# Patient Record
Sex: Female | Born: 1975 | Race: Asian | Hispanic: No | Marital: Married | Smoking: Never smoker
Health system: Southern US, Community
[De-identification: ages and names within clinical notes are randomized; demographics above are authoritative.]

## PROBLEM LIST (undated history)

## (undated) DIAGNOSIS — C801 Malignant (primary) neoplasm, unspecified: Secondary | ICD-10-CM

## (undated) DIAGNOSIS — Z789 Other specified health status: Secondary | ICD-10-CM

## (undated) HISTORY — PX: NO PAST SURGERIES: SHX2092

---

## 2012-09-06 ENCOUNTER — Other Ambulatory Visit (HOSPITAL_COMMUNITY): Payer: Self-pay | Admitting: Physician Assistant

## 2012-09-06 DIAGNOSIS — Z3689 Encounter for other specified antenatal screening: Secondary | ICD-10-CM

## 2012-09-06 LAB — OB RESULTS CONSOLE ANTIBODY SCREEN: Antibody Screen: NEGATIVE

## 2012-09-06 LAB — OB RESULTS CONSOLE RPR: RPR: NONREACTIVE

## 2012-09-06 LAB — OB RESULTS CONSOLE GC/CHLAMYDIA: Chlamydia: NEGATIVE

## 2012-09-11 ENCOUNTER — Ambulatory Visit (HOSPITAL_COMMUNITY)
Admission: RE | Admit: 2012-09-11 | Discharge: 2012-09-11 | Disposition: A | Discharge status: Home / Self Care | Payer: Medicaid Other | Source: Ambulatory Visit | Attending: Physician Assistant | Admitting: Physician Assistant

## 2012-09-11 DIAGNOSIS — Z3689 Encounter for other specified antenatal screening: Secondary | ICD-10-CM

## 2012-09-11 DIAGNOSIS — Z1389 Encounter for screening for other disorder: Secondary | ICD-10-CM | POA: Insufficient documentation

## 2012-09-11 DIAGNOSIS — Z363 Encounter for antenatal screening for malformations: Secondary | ICD-10-CM | POA: Insufficient documentation

## 2012-09-11 DIAGNOSIS — O358XX0 Maternal care for other (suspected) fetal abnormality and damage, not applicable or unspecified: Secondary | ICD-10-CM | POA: Insufficient documentation

## 2012-11-08 NOTE — L&D Delivery Note (Signed)
I was present for entire delivery and repair. Patient presented in spontaneous labor and progressed normally to complete dilation. Napoleon Form, MD

## 2012-11-08 NOTE — L&D Delivery Note (Signed)
Delivery Note At 2:19 AM a viable female was delivered via Vaginal, Spontaneous Delivery (Presentation: Left Occiput Anterior).  APGAR: 9, 10; weight Pending.   Placenta status: Intact, Spontaneous delivery, partial retained products that were manually removed.  Cord: 3 vessels with the following complications: None.  Cord pH: N/A  Anesthesia: Epidural  Episiotomy: None Lacerations: Labial, 1st Degree Suture Repair: 3.0 vicryl rapide Est. Blood Loss (mL): 500 cc  Mom to postpartum.  Baby to nursery-stable.  Twana First Reema Chick, DO of [facility] 12/12/2012, 3:06 AM

## 2012-11-13 ENCOUNTER — Other Ambulatory Visit (HOSPITAL_COMMUNITY): Payer: Self-pay | Admitting: Nurse Practitioner

## 2012-11-14 ENCOUNTER — Ambulatory Visit (HOSPITAL_COMMUNITY)
Admission: RE | Admit: 2012-11-14 | Discharge: 2012-11-14 | Disposition: A | Discharge status: Home / Self Care | Payer: Medicaid Other | Source: Ambulatory Visit | Attending: Nurse Practitioner | Admitting: Nurse Practitioner

## 2012-11-14 DIAGNOSIS — O365990 Maternal care for other known or suspected poor fetal growth, unspecified trimester, not applicable or unspecified: Secondary | ICD-10-CM | POA: Insufficient documentation

## 2012-11-14 DIAGNOSIS — Z3689 Encounter for other specified antenatal screening: Secondary | ICD-10-CM | POA: Insufficient documentation

## 2012-11-29 LAB — OB RESULTS CONSOLE GBS: GBS: POSITIVE

## 2012-12-06 ENCOUNTER — Other Ambulatory Visit (HOSPITAL_COMMUNITY): Payer: Self-pay | Admitting: Nurse Practitioner

## 2012-12-06 DIAGNOSIS — O365990 Maternal care for other known or suspected poor fetal growth, unspecified trimester, not applicable or unspecified: Secondary | ICD-10-CM

## 2012-12-11 ENCOUNTER — Encounter (HOSPITAL_COMMUNITY): Payer: Self-pay | Admitting: *Deleted

## 2012-12-11 ENCOUNTER — Encounter (HOSPITAL_COMMUNITY): Payer: Self-pay

## 2012-12-11 ENCOUNTER — Inpatient Hospital Stay (HOSPITAL_COMMUNITY)
Admission: AD | Admit: 2012-12-11 | Discharge: 2012-12-14 | DRG: 774 | Disposition: A | Discharge status: Home / Self Care | Payer: Medicaid Other | Source: Ambulatory Visit | Attending: Obstetrics & Gynecology | Admitting: Obstetrics & Gynecology

## 2012-12-11 ENCOUNTER — Encounter (HOSPITAL_COMMUNITY): Payer: Self-pay | Admitting: Anesthesiology

## 2012-12-11 ENCOUNTER — Inpatient Hospital Stay (HOSPITAL_COMMUNITY): Payer: Medicaid Other | Admitting: Anesthesiology

## 2012-12-11 ENCOUNTER — Ambulatory Visit (HOSPITAL_COMMUNITY)
Admission: RE | Admit: 2012-12-11 | Discharge: 2012-12-11 | Disposition: A | Discharge status: Home / Self Care | Payer: Medicaid Other | Source: Ambulatory Visit | Attending: Nurse Practitioner | Admitting: Nurse Practitioner

## 2012-12-11 ENCOUNTER — Inpatient Hospital Stay (HOSPITAL_COMMUNITY)
Admission: AD | Admit: 2012-12-11 | Discharge: 2012-12-11 | DRG: 780 | Disposition: A | Discharge status: Home / Self Care | Payer: Medicaid Other | Source: Ambulatory Visit | Attending: Obstetrics & Gynecology | Admitting: Obstetrics & Gynecology

## 2012-12-11 DIAGNOSIS — O365990 Maternal care for other known or suspected poor fetal growth, unspecified trimester, not applicable or unspecified: Secondary | ICD-10-CM | POA: Insufficient documentation

## 2012-12-11 DIAGNOSIS — O479 False labor, unspecified: Principal | ICD-10-CM | POA: Diagnosis present

## 2012-12-11 DIAGNOSIS — Z22330 Carrier of Group B streptococcus: Secondary | ICD-10-CM

## 2012-12-11 DIAGNOSIS — O09529 Supervision of elderly multigravida, unspecified trimester: Secondary | ICD-10-CM | POA: Diagnosis present

## 2012-12-11 DIAGNOSIS — O99892 Other specified diseases and conditions complicating childbirth: Secondary | ICD-10-CM | POA: Diagnosis present

## 2012-12-11 DIAGNOSIS — Z3689 Encounter for other specified antenatal screening: Secondary | ICD-10-CM

## 2012-12-11 DIAGNOSIS — O99891 Other specified diseases and conditions complicating pregnancy: Secondary | ICD-10-CM | POA: Diagnosis present

## 2012-12-11 DIAGNOSIS — IMO0001 Reserved for inherently not codable concepts without codable children: Secondary | ICD-10-CM

## 2012-12-11 DIAGNOSIS — O471 False labor at or after 37 completed weeks of gestation: Secondary | ICD-10-CM

## 2012-12-11 HISTORY — DX: Other specified health status: Z78.9

## 2012-12-11 LAB — RPR: RPR Ser Ql: NONREACTIVE

## 2012-12-11 LAB — CBC
Hemoglobin: 11.6 g/dL — ABNORMAL LOW (ref 12.0–15.0)
Platelets: 231 10*3/uL (ref 150–400)
RBC: 3.94 MIL/uL (ref 3.87–5.11)
WBC: 10.6 10*3/uL — ABNORMAL HIGH (ref 4.0–10.5)

## 2012-12-11 MED ORDER — OXYTOCIN BOLUS FROM INFUSION: 500.0000 mL | INTRAVENOUS | Status: DC

## 2012-12-11 MED ORDER — EPHEDRINE 5 MG/ML INJ: 10.0000 mg | INTRAVENOUS | Status: DC | PRN

## 2012-12-11 MED ORDER — PENICILLIN G POTASSIUM 5000000 UNITS IJ SOLR
5.0000 10*6.[IU] | Freq: Once | INTRAVENOUS | Status: AC
  Administered 2012-12-11: 5 10*6.[IU] via INTRAVENOUS
  Filled 2012-12-11: qty 5

## 2012-12-11 MED ORDER — OXYCODONE-ACETAMINOPHEN 5-325 MG PO TABS: 1.0000 | ORAL_TABLET | ORAL | Status: DC | PRN

## 2012-12-11 MED ORDER — NALBUPHINE HCL 10 MG/ML IJ SOLN: 10.0000 mg | INTRAMUSCULAR | Status: DC | PRN

## 2012-12-11 MED ORDER — LACTATED RINGERS IV SOLN: 500.0000 mL | INTRAVENOUS | Status: DC | PRN

## 2012-12-11 MED ORDER — IBUPROFEN 600 MG PO TABS: 600.0000 mg | ORAL_TABLET | Freq: Four times a day (QID) | ORAL | Status: DC | PRN

## 2012-12-11 MED ORDER — NALBUPHINE SYRINGE 5 MG/0.5 ML
10.0000 mg | INJECTION | INTRAMUSCULAR | Status: DC | PRN
  Administered 2012-12-11: 10 mg via INTRAVENOUS
  Filled 2012-12-11: qty 1

## 2012-12-11 MED ORDER — CITRIC ACID-SODIUM CITRATE 334-500 MG/5ML PO SOLN: 30.0000 mL | ORAL | Status: DC | PRN

## 2012-12-11 MED ORDER — EPHEDRINE 5 MG/ML INJ
10.0000 mg | INTRAVENOUS | Status: DC | PRN
  Filled 2012-12-11: qty 4

## 2012-12-11 MED ORDER — PENICILLIN G POTASSIUM 5000000 UNITS IJ SOLR
2.5000 10*6.[IU] | INTRAMUSCULAR | Status: DC
  Filled 2012-12-11 (×4): qty 2.5

## 2012-12-11 MED ORDER — FLEET ENEMA 7-19 GM/118ML RE ENEM: 1.0000 | ENEMA | RECTAL | Status: DC | PRN

## 2012-12-11 MED ORDER — ACETAMINOPHEN 325 MG PO TABS: 650.0000 mg | ORAL_TABLET | ORAL | Status: DC | PRN

## 2012-12-11 MED ORDER — OXYTOCIN 40 UNITS IN LACTATED RINGERS INFUSION - SIMPLE MED: 62.5000 mL/h | INTRAVENOUS | Status: DC

## 2012-12-11 MED ORDER — PHENYLEPHRINE 40 MCG/ML (10ML) SYRINGE FOR IV PUSH (FOR BLOOD PRESSURE SUPPORT): 80.0000 ug | PREFILLED_SYRINGE | INTRAVENOUS | Status: DC | PRN

## 2012-12-11 MED ORDER — OXYTOCIN 40 UNITS IN LACTATED RINGERS INFUSION - SIMPLE MED
62.5000 mL/h | INTRAVENOUS | Status: DC
  Administered 2012-12-12: 62.5 mL/h via INTRAVENOUS
  Administered 2012-12-12: 999 mL/h via INTRAVENOUS
  Filled 2012-12-11: qty 1000

## 2012-12-11 MED ORDER — LACTATED RINGERS IV SOLN
500.0000 mL | Freq: Once | INTRAVENOUS | Status: AC
  Administered 2012-12-11: 500 mL via INTRAVENOUS

## 2012-12-11 MED ORDER — PHENYLEPHRINE 40 MCG/ML (10ML) SYRINGE FOR IV PUSH (FOR BLOOD PRESSURE SUPPORT)
80.0000 ug | PREFILLED_SYRINGE | INTRAVENOUS | Status: DC | PRN
  Filled 2012-12-11: qty 5

## 2012-12-11 MED ORDER — LACTATED RINGERS IV SOLN
INTRAVENOUS | Status: DC
  Administered 2012-12-11: 14:00:00 via INTRAVENOUS

## 2012-12-11 MED ORDER — LIDOCAINE HCL (PF) 1 % IJ SOLN
INTRAMUSCULAR | Status: DC | PRN
  Administered 2012-12-11 (×2): 5 mL

## 2012-12-11 MED ORDER — LIDOCAINE HCL (PF) 1 % IJ SOLN: 30.0000 mL | INTRAMUSCULAR | Status: DC | PRN

## 2012-12-11 MED ORDER — FLEET ENEMA 7-19 GM/118ML RE ENEM: 1.0000 | ENEMA | Freq: Every day | RECTAL | Status: DC | PRN

## 2012-12-11 MED ORDER — SODIUM CHLORIDE 0.9 % IV SOLN
2.0000 g | Freq: Once | INTRAVENOUS | Status: AC
  Administered 2012-12-11: 2 g via INTRAVENOUS
  Filled 2012-12-11: qty 2000

## 2012-12-11 MED ORDER — DIPHENHYDRAMINE HCL 50 MG/ML IJ SOLN: 12.5000 mg | INTRAMUSCULAR | Status: DC | PRN

## 2012-12-11 MED ORDER — FENTANYL 2.5 MCG/ML BUPIVACAINE 1/10 % EPIDURAL INFUSION (WH - ANES)
14.0000 mL/h | INTRAMUSCULAR | Status: DC
  Administered 2012-12-11: 14 mL/h via EPIDURAL
  Filled 2012-12-11: qty 125

## 2012-12-11 MED ORDER — ONDANSETRON HCL 4 MG/2ML IJ SOLN: 4.0000 mg | Freq: Four times a day (QID) | INTRAMUSCULAR | Status: DC | PRN

## 2012-12-11 MED ORDER — LACTATED RINGERS IV SOLN
INTRAVENOUS | Status: DC
  Administered 2012-12-11: 23:00:00 via INTRAVENOUS

## 2012-12-11 MED ORDER — ZOLPIDEM TARTRATE 5 MG PO TABS: 5.0000 mg | ORAL_TABLET | Freq: Every evening | ORAL | Status: DC | PRN

## 2012-12-11 NOTE — Discharge Summary (Signed)
  Claire Bray  ADMIT DATE:  12/11/2012  DISCHARGE DATE:  12/11/2012   ATTENDING:  Jaynie Collins, MD  DISCHARGE DIAGNOSES:  IUP at [redacted]w[redacted]d, false labor  Results for orders placed during the hospital encounter of 12/11/12 (from the past 24 hour(s))  CBC     Status: Abnormal   Collection Time   12/11/12  1:35 PM      Component Value Range   WBC 10.6 (*) 4.0 - 10.5 K/uL   RBC 3.94  3.87 - 5.11 MIL/uL   Hemoglobin 11.6 (*) 12.0 - 15.0 g/dL   HCT 09.8 (*) 11.9 - 14.7 %   MCV 89.3  78.0 - 100.0 fL   MCH 29.4  26.0 - 34.0 pg   MCHC 33.0  30.0 - 36.0 g/dL   RDW 82.9  56.2 - 13.0 %   Platelets 231  150 - 400 K/uL   Studies: ULTRASOUND/BPP AFI 10.1, BPP 6/8 - minus 2 for fetal breathing. EFW 21%, cephalic  FHTs:  865-784, moderate variability, accels present, no decels - monitored for 4.5 hours TOCO;  Contractions every 8-10 minutes with irritabilty  HOSP COURSE:  Claire Bray is a 37 y.o. G2P1001 at [redacted]w[redacted]d sent from the health department for a biophysical profile. She was also complaining of abdominal pain that is intermittent, mostly both sides and lower abdomen. Her cervix changed from 3 to 4 cm in MAU so she was admitted to Labor and Delivery for active labor. However, her cervix did not change further after 5 hours, and her contractions spaced out to every 8 to 10 minutes. She continued to have intermittent abdominal pain and was given a dose of Nubain, which helped.   At time of discharge her exam was as follows: Filed Vitals:   12/11/12 1329  BP: 123/82  Pulse: 84  Temp:   Resp: 18   GEN:  WNWD, no acute distress ABD:  Non-tender, no guarding or rebound EXTREM:  No edema or tenderness  Dilation: 4 Effacement (%): 50 Cervical Position: Middle Station: -2 Presentation: Vertex Exam by:: Dr. Thad Ranger  She was discharged with labor precautions discussed via telephone [place] translator with tylenol for pain and ambien for sleep tonight.   ACTIVITY:  As tolerated DIET:  Regular FOLLOW  UP at [facility] Department tomorrow as scheduled  Napoleon Form, MD 12/11/2012 5:45 PM

## 2012-12-11 NOTE — MAU Note (Signed)
Pt states via [place] interpreter notes vaginal discharge this am, denies gush of fluid or bleeding. States feeling cramping since early this am.

## 2012-12-11 NOTE — MAU Note (Signed)
Patient speaks [place] Patients husband states she is having a little contractions and had a little white discharge this am. Reports good fetal movement. Will call translator for triage.

## 2012-12-11 NOTE — H&P (Signed)
Claire Bray is a 37 y.o. female presenting for abdominal pain that started this morning. History is done through phone translator in [place] Pain comes and goes but has been present since morning. Patient indicates pain is bilateral mid- to low-abdomen. She states that it is 7/10. No bleeding or loss of fluid. Baby active and moving.  [facility] She was sent to hospital for BPP today, which was 6/8 (minus 2 for fetal breathing). . Maternal Medical History:  Reason for admission: Reason for admission: contractions.  Reason for Admission:   nauseaContractions: Onset was 6-12 hours ago.   Frequency: irregular.   Perceived severity is moderate.    Fetal activity: Perceived fetal activity is normal.    Prenatal complications: no prenatal complications Prenatal Complications - Diabetes: none.    OB History    Grav Para Term Preterm Abortions TAB SAB Ect Mult Living   2 1 1       1      Past Medical History  Diagnosis Date  . No pertinent past medical history    Past Surgical History  Procedure Date  . No past surgeries    Family History: family history is negative for Other. Social History:  reports that she has never smoked. She has never used smokeless tobacco. She reports that she does not drink alcohol or use illicit drugs.   Prenatal Transfer Tool  Maternal Diabetes: No Genetic Screening: Declined Maternal Ultrasounds/Referrals: Normal Fetal Ultrasounds or other Referrals:  None Maternal Substance Abuse:  No Significant Maternal Medications:  None Significant Maternal Lab Results:  Lab values include: Group B Strep positive Other Comments:  None  Review of Systems  Constitutional: Negative for fever and chills.  Gastrointestinal: Positive for abdominal pain. Negative for nausea, vomiting, diarrhea and constipation.  Genitourinary: Negative for dysuria.  Musculoskeletal: Positive for back pain.    Dilation: 4 Effacement (%):  50 Station: -2 Exam by:: Dr. Thad Ranger Blood pressure 103/67, pulse 76, temperature 98 F (36.7 C), temperature source Oral, resp. rate 18, height 5' 4.5" (1.638 m), weight 65.318 kg (144 lb). Maternal Exam:  Uterine Assessment: Contraction strength is moderate.  Contraction frequency is irregular.   Abdomen: Fetal presentation: vertex  Introitus: Normal vulva. Normal vagina.  Vagina is negative for discharge.  Ferning test: not done.  Nitrazine test: not done.  Pelvis: adequate for delivery.   Cervix: Cervix evaluated by digital exam.     Fetal Exam Fetal Monitor Review: Baseline rate: 135.  Variability: moderate (6-25 bpm).   Pattern: accelerations present and no decelerations.    Fetal State Assessment: Category I - tracings are normal.     Physical Exam  Constitutional: She is oriented to person, place, and time. She appears well-developed and well-nourished. No distress.  HENT:  Head: Normocephalic and atraumatic.  Eyes: Conjunctivae normal and EOM are normal.  Neck: Normal range of motion. Neck supple.  Cardiovascular: Normal rate.   Respiratory: Effort normal. No respiratory distress.  GI: Soft. There is no tenderness. There is no rebound and no guarding.  Genitourinary: Vagina normal. No vaginal discharge found.  Musculoskeletal: Normal range of motion. She exhibits no edema and no tenderness.  Neurological: She is alert and oriented to person, place, and time.  Skin: Skin is warm and dry.  Psychiatric: She has a normal mood and affect.    Filed Vitals:   12/11/12 1328 12/11/12 1329 12/11/12 1748 12/11/12 1749  BP:  123/82 103/67 103/67  Pulse:  84  76  Temp: 97.9 F (36.6 C)  98 F (36.7 C)   TempSrc: Oral  Oral   Resp: 18 18 18 18   Height:      Weight:         Cervix:  3/40/-3 --> 4/50/-2 in MAU   Prenatal labs: ABO, Rh: AB/Positive/-- (10/30 0000) Antibody: Negative (10/30 0000) Rubella: Immune (10/30 0000) RPR: Nonreactive (10/30 0000)  HBsAg:  Negative (10/30 0000)  HIV: Non-reactive (10/30 0000)  GBS: Positive (01/22 0000)   Assessment/Plan: 37 y.o. G2P1001 at [redacted]w[redacted]d with contractions and cervical change - Admit to L&D - GBS prophylaxis with penicillin - NST reassuring - Expectant management, anticipate SVD  Napoleon Form 12/11/2012, 10:06 PM

## 2012-12-11 NOTE — Anesthesia Procedure Notes (Signed)
Epidural Patient location during procedure: OB Start time: 12/11/2012 11:50 PM  Staffing Anesthesiologist: Angus Seller., Harrell Gave. Performed by: anesthesiologist   Preanesthetic Checklist Completed: patient identified, site marked, surgical consent, pre-op evaluation, timeout performed, IV checked, risks and benefits discussed and monitors and equipment checked  Epidural Patient position: sitting Prep: site prepped and draped and DuraPrep Patient monitoring: continuous pulse ox and blood pressure Approach: midline Injection technique: LOR air  Needle:  Needle type: Tuohy  Needle gauge: 17 G Needle length: 9 cm and 9 Needle insertion depth: 5 cm cm Catheter type: closed end flexible Catheter size: 19 Gauge Catheter at skin depth: 10 cm Test dose: negative  Assessment Events: blood not aspirated, injection not painful, no injection resistance, negative IV test and no paresthesia  Additional Notes Patient identified.  Risk benefits discussed including failed block, incomplete pain control, headache, nerve damage, paralysis, blood pressure changes, nausea, vomiting, reactions to medication both toxic or allergic, and postpartum back pain.  Patient expressed understanding and wished to proceed.  All questions were answered.  Sterile technique used throughout procedure and epidural site dressed with sterile barrier dressing. No paresthesia or other complications noted.The patient did not experience any signs of intravascular injection such as tinnitus or metallic taste in mouth nor signs of intrathecal spread such as rapid motor block. Please see nursing notes for vital signs.

## 2012-12-11 NOTE — OB Triage Note (Signed)
Discharge instructions given and discussed with patient and significant other. [place] interpreters used. All questions answered. Patient discharged home with significant other.

## 2012-12-11 NOTE — H&P (Signed)
Attestation of Attending Supervision of Advanced Practitioner (PA/CNM/NP): Evaluation and management procedures were performed by the Advanced Practitioner under my supervision and collaboration.  I have reviewed the Advanced Practitioner's note and chart, and I agree with the management and plan.  Akiya Morr, MD, FACOG Attending Obstetrician & Gynecologist Faculty Practice, [facility]  

## 2012-12-11 NOTE — Anesthesia Preprocedure Evaluation (Signed)

## 2012-12-11 NOTE — H&P (Signed)
Claire Bray is a 37 y.o. female presenting for contractions. Pt was admitted earlier today at 4 cm but had no change for several hours and was discharged home. She returns with stronger contractions and now dilated to 6.5 cm.   Maternal Medical History:  Reason for admission: Reason for admission: contractions.  Reason for Admission:   nauseaContractions: Onset was 6-12 hours ago.   Frequency: regular.   Perceived severity is strong.    Fetal activity: Perceived fetal activity is normal.   Last perceived fetal movement was within the past hour.    Prenatal complications: no prenatal complications Prenatal Complications - Diabetes: none.    OB History    Grav Para Term Preterm Abortions TAB SAB Ect Mult Living   2 1 1       1      Past Medical History  Diagnosis Date  . No pertinent past medical history    Past Surgical History  Procedure Date  . No past surgeries    Family History: family history is negative for Other. Social History:  reports that she has never smoked. She has never used smokeless tobacco. She reports that she does not drink alcohol or use illicit drugs.   Prenatal Transfer Tool  Maternal Diabetes: No Genetic Screening: Declined Maternal Ultrasounds/Referrals: Normal Fetal Ultrasounds or other Referrals:  None Maternal Substance Abuse:  No Significant Maternal Medications:  None Significant Maternal Lab Results:  Lab values include: Group B Strep positive Other Comments:  None  Review of Systems  Constitutional: Negative for fever, chills and diaphoresis.  Eyes: Negative for blurred vision and double vision.  Respiratory: Negative for shortness of breath.   Cardiovascular: Negative for chest pain.  Gastrointestinal: Positive for abdominal pain. Negative for nausea, vomiting, diarrhea and constipation.  Genitourinary: Negative for dysuria.  Musculoskeletal: Negative for back pain.  Neurological: Negative for dizziness and headaches.    Dilation:  6.5 Effacement (%): 90 Station: -2 Exam by:: Vira Blanco, RN There were no vitals taken for this visit. Maternal Exam:  Uterine Assessment: Contraction strength is firm.  Contraction frequency is regular.   Abdomen: Patient reports generalized tenderness.  Fetal presentation: vertex  Introitus: Normal vulva. Normal vagina.  Pelvis: adequate for delivery.   Cervix: Cervix evaluated by digital exam.     Fetal Exam Fetal Monitor Review: Mode: ultrasound.   Baseline rate: 135-140.  Variability: moderate (6-25 bpm).   Pattern: accelerations present and no decelerations.    Fetal State Assessment: Category I - tracings are normal.     Physical Exam  Constitutional: She is oriented to person, place, and time. She appears well-developed and well-nourished. She appears distressed (from pain).  HENT:  Head: Normocephalic and atraumatic.  Eyes: Conjunctivae normal and EOM are normal.  Neck: Normal range of motion.  Cardiovascular: Normal rate and regular rhythm.   Respiratory: Effort normal. No respiratory distress.  GI: There is generalized tenderness. There is no rebound and no guarding.  Genitourinary:       Normal external genitalia, normal vagina. Bloody show.  Musculoskeletal: Normal range of motion. She exhibits no edema and no tenderness.  Neurological: She is alert and oriented to person, place, and time.  Skin: Skin is warm and dry.  Psychiatric: She has a normal mood and affect.    Cervix 6.5/90/-2  Prenatal labs: ABO, Rh: AB/Positive/-- (10/30 0000) Antibody: Negative (10/30 0000) Rubella: Immune (10/30 0000) RPR: Nonreactive (10/30 0000)  HBsAg: Negative (10/30 0000)  HIV: Non-reactive (10/30 0000)  GBS: Positive (  01/22 0000)   Assessment/Plan: 37 y.o. G2P1001 at [redacted]w[redacted]d with SOL, 6.5 cm dilated Admit to L&D GBS prophylaxis with ampicillin Epidural as soon as possible Anticipate SVD   Napoleon Form 12/11/2012, 11:03 PM

## 2012-12-11 NOTE — Discharge Summary (Signed)
Attestation of Attending Supervision of Obstetric Fellow: Evaluation and management procedures were performed by the Obstetric Fellow under my supervision and collaboration.  I have reviewed the Obstetric Fellow's note and chart, and I agree with the management and plan.  UGONNA  ANYANWU, MD, FACOG Attending Obstetrician & Gynecologist Faculty Practice, [facility]   

## 2012-12-11 NOTE — ED Notes (Signed)
Claire Bray is a 37 y.o. female presenting for labor rule out.  [place] Education officer, community used via phone.  37 yo G2P1001 at 39.0 by 26 week [place]. Presented this am for [place] with BPP for fetal growth monitoring and ?non reactive NST. BPP today was 6/8 (no breathing observed).  She reports feeling abdominal tightening, constant, located in mid abdomen, which started at 7am. Feeling fetal movement. Felt some urine this morning, but no leaking of fluid. No blood.   Prenatal history has been complicated by late prenatal care, advanced maternal age and measuring small for dates. [place] on 12/11/12 showed estimated fetal weight in the 21st percentile.   History OB History    Grav Para Term Preterm Abortions TAB SAB Ect Mult Living   2 1 1       1      Past Medical History  Diagnosis Date  . No pertinent past medical history    Past Surgical History  Procedure Date  . No past surgeries    Family History: family history is negative for Other. Social History:  reports that she has never smoked. She has never used smokeless tobacco. She reports that she does not drink alcohol or use illicit drugs.   Prenatal Transfer Tool  Maternal Diabetes: No Genetic Screening: normal quad screen Maternal Ultrasounds/Referrals: Normal Fetal Ultrasounds or other Referrals:  Other: BPP on 12/11/12: 6/8 (-2 points for fetal breathing) Maternal Substance Abuse:  No Significant Maternal Medications:  Meds include: Other: amoxicillin  Significant Maternal Lab Results:  Lab values include: Group B Strep positive Other Comments:  BPP on 12/11/12: 6/8 (-2 points for fetal breathing).   ROS Negative except per HPI Dilation: 4 Effacement (%): 50 Station: -2 Exam by:: SBeck, RN Blood pressure 111/86, pulse 87, temperature 97.9 F (36.6 C), temperature source Oral, resp. rate 18, height 5' 4.5" (1.638 m), weight 144 lb (65.318 kg). Exam Physical Exam  Physical Examination: General appearance - alert, well appearing, and in  distress with contractions Mouth -  Poor dentition Chest - clear to auscultation, no wheezes, rales or rhonchi, symmetric air entry Heart - normal rate, regular rhythm, normal S1, S2, no murmurs, rubs, clicks or gallops Pelvic - normal external genitalia, vulva, vagina, cervix, uterus. No pooling observed. Whitish discharge. Negative ferning.  Extremities - no edema, negative Homman's  Prenatal labs: ABO, Rh:  AB pos Antibody:  negative Rubella:  immune RPR:   negative HBsAg:   negative HIV:   negative GBS:  positive by urine Cx   Negative fern FHR: 135, moderate variability, reactive strip Contractions: every 2-4 minutes.   Assessment/Plan: 37 yo G2P1001 at 39.0wga by 26 week ultrasound who presents with contractions. Admitted for active labor.  - admit to labor and delivery - GBS positive: penicillin - pain management: nubain prn. Plans on epidural - expected mode of delivery: NSVD  Oshua Mcconaha 12/11/2012, 1:32 PM

## 2012-12-11 NOTE — Progress Notes (Signed)
Patient ID: Haeven Nickle, female   DOB: 06-10-1976, 37 y.o.   MRN: [id number]   S: Using husband to translate, states constant pain in back and stomach.  O:  Filed Vitals:   12/11/12 1328  BP:   Pulse:   Temp: 97.9 F (36.6 C)  Resp: 18    Cervix:  4/50/-2, soft, middle  FHTs:  135, mod variability, accels present, no decels TOCO:  q7-8 minutes  A/P 37 y.o. G2P1001 at [redacted]w[redacted]d with early latent labor. Had change in MAU from 3 to 4 cm but now ctx are spread out and no further cervical change. Nubain for pain Recheck in 2 hours. If not changing, consider d/c home.  Napoleon Form, MD 12/11/2012 2:58 PM

## 2012-12-12 ENCOUNTER — Encounter (HOSPITAL_COMMUNITY): Payer: Self-pay

## 2012-12-12 DIAGNOSIS — O09529 Supervision of elderly multigravida, unspecified trimester: Secondary | ICD-10-CM

## 2012-12-12 DIAGNOSIS — O9989 Other specified diseases and conditions complicating pregnancy, childbirth and the puerperium: Secondary | ICD-10-CM

## 2012-12-12 LAB — TYPE AND SCREEN
ABO/RH(D): AB POS
Antibody Screen: NEGATIVE

## 2012-12-12 MED ORDER — WITCH HAZEL-GLYCERIN EX PADS: 1.0000 "application " | MEDICATED_PAD | CUTANEOUS | Status: DC | PRN

## 2012-12-12 MED ORDER — DIBUCAINE 1 % RE OINT
1.0000 "application " | TOPICAL_OINTMENT | RECTAL | Status: DC | PRN
  Filled 2012-12-12: qty 28

## 2012-12-12 MED ORDER — PRENATAL MULTIVITAMIN CH
1.0000 | ORAL_TABLET | Freq: Every day | ORAL | Status: DC
  Administered 2012-12-12 – 2012-12-14 (×3): 1 via ORAL
  Filled 2012-12-12 (×3): qty 1

## 2012-12-12 MED ORDER — SIMETHICONE 80 MG PO CHEW: 80.0000 mg | CHEWABLE_TABLET | ORAL | Status: DC | PRN

## 2012-12-12 MED ORDER — BENZOCAINE-MENTHOL 20-0.5 % EX AERO
1.0000 "application " | INHALATION_SPRAY | CUTANEOUS | Status: DC | PRN
  Filled 2012-12-12: qty 56

## 2012-12-12 MED ORDER — MISOPROSTOL 200 MCG PO TABS
ORAL_TABLET | ORAL | Status: AC
  Administered 2012-12-12: 800 ug
  Filled 2012-12-12: qty 4

## 2012-12-12 MED ORDER — DIPHENHYDRAMINE HCL 25 MG PO CAPS: 25.0000 mg | ORAL_CAPSULE | Freq: Four times a day (QID) | ORAL | Status: DC | PRN

## 2012-12-12 MED ORDER — SENNOSIDES-DOCUSATE SODIUM 8.6-50 MG PO TABS
2.0000 | ORAL_TABLET | Freq: Every day | ORAL | Status: DC
  Administered 2012-12-12: 2 via ORAL

## 2012-12-12 MED ORDER — TETANUS-DIPHTH-ACELL PERTUSSIS 5-2.5-18.5 LF-MCG/0.5 IM SUSP
0.5000 mL | Freq: Once | INTRAMUSCULAR | Status: AC
  Administered 2012-12-14: 0.5 mL via INTRAMUSCULAR

## 2012-12-12 MED ORDER — ZOLPIDEM TARTRATE 5 MG PO TABS: 5.0000 mg | ORAL_TABLET | Freq: Every evening | ORAL | Status: DC | PRN

## 2012-12-12 MED ORDER — ONDANSETRON HCL 4 MG/2ML IJ SOLN: 4.0000 mg | INTRAMUSCULAR | Status: DC | PRN

## 2012-12-12 MED ORDER — ONDANSETRON HCL 4 MG PO TABS: 4.0000 mg | ORAL_TABLET | ORAL | Status: DC | PRN

## 2012-12-12 MED ORDER — OXYCODONE-ACETAMINOPHEN 5-325 MG PO TABS
1.0000 | ORAL_TABLET | ORAL | Status: DC | PRN
  Administered 2012-12-12 – 2012-12-13 (×3): 1 via ORAL
  Administered 2012-12-14: 2 via ORAL
  Filled 2012-12-12 (×2): qty 1
  Filled 2012-12-12: qty 2
  Filled 2012-12-12: qty 1

## 2012-12-12 MED ORDER — IBUPROFEN 600 MG PO TABS
600.0000 mg | ORAL_TABLET | Freq: Four times a day (QID) | ORAL | Status: DC
  Administered 2012-12-12 – 2012-12-14 (×7): 600 mg via ORAL
  Filled 2012-12-12 (×10): qty 1

## 2012-12-12 MED ORDER — LANOLIN HYDROUS EX OINT: TOPICAL_OINTMENT | CUTANEOUS | Status: DC | PRN

## 2012-12-12 NOTE — Progress Notes (Signed)
Patient ID: Claire Bray, female   DOB: [birth year], 37 y.o.   MRN: [id number]   S:  Pt blocked and comfortable  O:  Filed Vitals:   12/12/12 0040  BP: 97/59  Pulse: 85  Temp:   Resp:     Dilation: 9 Effacement (%): 100 Cervical Position: Middle Station: 0;+1 Presentation: Vertex Exam by:: Dr. Thad Ranger  FHTs: 135, mod var, accels present, no decels TOCO: q3-5  AROM:  Clear with bloody show  A/P 37 y.o. G2P1001 at [redacted]w[redacted]d with SOL - Progressing well without pitocin - Anticipate SVD soon  Napoleon Form, MD 12/12/2012  12:57 AM

## 2012-12-12 NOTE — ED Notes (Signed)
I saw and examined patient and agree with above resident note. See my H&P same date. Napoleon Form, mD

## 2012-12-12 NOTE — Progress Notes (Signed)
UR chart review completed.  

## 2012-12-12 NOTE — Anesthesia Postprocedure Evaluation (Signed)
Anesthesia Post Note  Patient: Claire Bray  Procedure(s) Performed: * No procedures listed *  Anesthesia type: Epidural  Patient location: Mother/Baby  Post pain: Pain level controlled  Post assessment: Post-op Vital signs reviewed  Last Vitals: BP 102/66  Pulse 76  Temp 36.9 C (Oral)  Resp 16  Ht 5' 4.5" (1.638 m)  Wt 144 lb (65.318 kg)  BMI 24.34 kg/m2  SpO2 100%  Breastfeeding? Unknown  Post vital signs: Reviewed  Level of consciousness: awake  Complications: No apparent anesthesia complications

## 2012-12-12 NOTE — H&P (Signed)
Attestation of Attending Supervision of Obstetric Fellow: Evaluation and management procedures were performed by the Obstetric Fellow under my supervision and collaboration.  I have reviewed the Obstetric Fellow's note and chart, and I agree with the management and plan.  Rashea Hoskie, MD, FACOG Attending Obstetrician & Gynecologist Faculty Practice, [facility]   

## 2012-12-13 LAB — CBC
HCT: 32.1 % — ABNORMAL LOW (ref 36.0–46.0)
Hemoglobin: 10.6 g/dL — ABNORMAL LOW (ref 12.0–15.0)
MCV: 89.4 fL (ref 78.0–100.0)
RBC: 3.59 MIL/uL — ABNORMAL LOW (ref 3.87–5.11)
RDW: 13.8 % (ref 11.5–15.5)
WBC: 10.4 10*3/uL (ref 4.0–10.5)

## 2012-12-13 NOTE — Progress Notes (Signed)
I have seen and examined this patient and I agree with the above. Claire Bray, Claire Bray 7:45 AM 12/13/2012

## 2012-12-13 NOTE — Discharge Summary (Signed)
Claire Bray is a 37 y/o female G2P2002 who presented to MAU at 39.1 wks with contractions.  Pt received an epidural and progressed in active labor.  On 12/12/12, At 2:19 AM a viable female was delivered via Vaginal, Spontaneous Delivery (Presentation: Left Occiput Anterior). APGAR: 9, 10. Placenta status: Intact, Spontaneous delivery, partial retained products that were manually removed. Cord: 3 vessels.  Pt would like to breast and bottle feed, and would like Depo for contraception     Obstetric Discharge Summary Reason for Admission: onset of labor Prenatal Procedures: none Intrapartum Procedures: spontaneous vaginal delivery Postpartum Procedures: none Complications-Operative and Postpartum: vaginal laceration Hemoglobin  Date Value Range Status  12/13/2012 10.6* 12.0 - 15.0 g/dL Final     HCT  Date Value Range Status  12/13/2012 32.1* 36.0 - 46.0 % Final    Physical Exam:  General: alert, cooperative and appears stated age 3: appropriate Uterine Fundus: firm DVT Evaluation: No evidence of DVT seen on physical exam.  Discharge Diagnoses: Term Pregnancy-delivered  Discharge Information: Date: 12/14/2012 Activity: pelvic rest Diet: routine Medications: Ibuprofen Condition: stable Instructions: refer to practice specific booklet Discharge to: home   Newborn Data: Live born female  Birth Weight: 6 lb 8 oz (2948 g) APGAR: 9, 10  Home with mother.  Twana First Paulina Fusi, DO of [facility] 12/14/2012, 7:55 AM

## 2012-12-13 NOTE — Progress Notes (Signed)
Post Partum Day 1 Subjective: no complaints, voiding and tolerating PO  Objective: Blood pressure 111/74, pulse 79, temperature 98.1 F (36.7 C), temperature source Oral, resp. rate 18, height 5' 4.5" (1.638 m), weight 65.318 kg (144 lb), SpO2 100.00%, unknown if currently breastfeeding.  Physical Exam:  General: alert, cooperative and appears stated age Lochia: appropriate Uterine Fundus: firm DVT Evaluation: No evidence of DVT seen on physical exam.   Basename 12/13/12 0535 12/11/12 1335  HGB 10.6* 11.6*  HCT 32.1* 35.2*    Assessment/Plan: Plan for discharge tomorrow and Contraception Depo, breast feeding   LOS: 2 days   Claire Bray R. Paulina Fusi, DO of [facility] 12/13/2012, 7:43 AM

## 2012-12-14 MED ORDER — IBUPROFEN 600 MG PO TABS: 600.0000 mg | ORAL_TABLET | Freq: Four times a day (QID) | ORAL | Status: DC

## 2012-12-14 MED ORDER — BENZOCAINE-MENTHOL 20-0.5 % EX AERO: 1.0000 "application " | INHALATION_SPRAY | CUTANEOUS | Status: DC | PRN

## 2012-12-17 NOTE — Discharge Summary (Signed)
I examined pt and agree with documentation above and resident plan of care. MUHAMMAD,Starkeisha Vanwinkle  

## 2012-12-19 ENCOUNTER — Ambulatory Visit (INDEPENDENT_AMBULATORY_CARE_PROVIDER_SITE_OTHER): Payer: Medicaid Other | Admitting: *Deleted

## 2012-12-19 VITALS — BP 104/71 | HR 85 | Wt 134.2 lb

## 2012-12-19 DIAGNOSIS — Z3049 Encounter for surveillance of other contraceptives: Secondary | ICD-10-CM

## 2012-12-19 MED ORDER — MEDROXYPROGESTERONE ACETATE 150 MG/ML IM SUSP
150.0000 mg | Freq: Once | INTRAMUSCULAR | Status: AC
  Administered 2012-12-19: 150 mg via INTRAMUSCULAR

## 2012-12-19 NOTE — Progress Notes (Signed)
Pt here for Depo Provera injection only. She had NSVD on 12/12/12. Her PP appt is scheduled @ [facility] on 01/25/13.  [employer] interpreter # [phone] used to verify pt's understanding of reason for today's visit and medication administration.

## 2014-09-09 ENCOUNTER — Encounter (HOSPITAL_COMMUNITY): Payer: Self-pay

## 2017-01-12 DIAGNOSIS — K2970 Gastritis, unspecified, without bleeding: Secondary | ICD-10-CM

## 2017-01-12 DIAGNOSIS — B9681 Helicobacter pylori [H. pylori] as the cause of diseases classified elsewhere: Secondary | ICD-10-CM | POA: Insufficient documentation

## 2017-03-03 ENCOUNTER — Other Ambulatory Visit: Payer: Self-pay | Admitting: Internal Medicine

## 2017-03-03 DIAGNOSIS — R634 Abnormal weight loss: Secondary | ICD-10-CM

## 2017-03-03 DIAGNOSIS — R1013 Epigastric pain: Secondary | ICD-10-CM

## 2017-03-03 DIAGNOSIS — R1110 Vomiting, unspecified: Secondary | ICD-10-CM

## 2017-03-11 ENCOUNTER — Other Ambulatory Visit: Payer: Medicaid Other

## 2017-03-21 ENCOUNTER — Ambulatory Visit: Payer: Medicaid Other

## 2017-03-21 ENCOUNTER — Ambulatory Visit
Admission: RE | Admit: 2017-03-21 | Discharge: 2017-03-21 | Disposition: A | Discharge status: Home / Self Care | Payer: Medicaid Other | Source: Ambulatory Visit | Attending: Internal Medicine | Admitting: Internal Medicine

## 2017-03-21 DIAGNOSIS — R634 Abnormal weight loss: Secondary | ICD-10-CM

## 2017-03-21 DIAGNOSIS — R1110 Vomiting, unspecified: Secondary | ICD-10-CM

## 2017-03-21 DIAGNOSIS — R1013 Epigastric pain: Secondary | ICD-10-CM

## 2017-04-01 ENCOUNTER — Encounter (HOSPITAL_COMMUNITY): Payer: Self-pay

## 2017-04-01 DIAGNOSIS — N9489 Other specified conditions associated with female genital organs and menstrual cycle: Secondary | ICD-10-CM | POA: Diagnosis not present

## 2017-04-01 DIAGNOSIS — K3184 Gastroparesis: Secondary | ICD-10-CM | POA: Insufficient documentation

## 2017-04-01 DIAGNOSIS — R1031 Right lower quadrant pain: Secondary | ICD-10-CM | POA: Diagnosis present

## 2017-04-01 LAB — URINALYSIS, ROUTINE W REFLEX MICROSCOPIC
Bilirubin Urine: NEGATIVE
GLUCOSE, UA: NEGATIVE mg/dL
KETONES UR: 5 mg/dL — AB
NITRITE: NEGATIVE
PROTEIN: NEGATIVE mg/dL
Specific Gravity, Urine: 1.03 (ref 1.005–1.030)
pH: 5 (ref 5.0–8.0)

## 2017-04-01 LAB — COMPREHENSIVE METABOLIC PANEL
ALBUMIN: 4.4 g/dL (ref 3.5–5.0)
ALK PHOS: 35 U/L — AB (ref 38–126)
ALT: 16 U/L (ref 14–54)
AST: 20 U/L (ref 15–41)
Anion gap: 7 (ref 5–15)
BILIRUBIN TOTAL: 0.5 mg/dL (ref 0.3–1.2)
BUN: 12 mg/dL (ref 6–20)
CALCIUM: 9.3 mg/dL (ref 8.9–10.3)
CO2: 26 mmol/L (ref 22–32)
Chloride: 103 mmol/L (ref 101–111)
Creatinine, Ser: 0.73 mg/dL (ref 0.44–1.00)
GFR calc Af Amer: >60 mL/min (ref >60)
GFR calc non Af Amer: >60 mL/min (ref >60)
GLUCOSE: 98 mg/dL (ref 65–99)
POTASSIUM: 3.6 mmol/L (ref 3.5–5.1)
SODIUM: 136 mmol/L (ref 135–145)
TOTAL PROTEIN: 7.4 g/dL (ref 6.5–8.1)

## 2017-04-01 LAB — CBC
HEMATOCRIT: 41.9 % (ref 36.0–46.0)
HEMOGLOBIN: 14 g/dL (ref 12.0–15.0)
MCH: 29.2 pg (ref 26.0–34.0)
MCHC: 33.4 g/dL (ref 30.0–36.0)
MCV: 87.5 fL (ref 78.0–100.0)
Platelets: 240 10*3/uL (ref 150–400)
RBC: 4.79 MIL/uL (ref 3.87–5.11)
RDW: 13.1 % (ref 11.5–15.5)
WBC: 6.1 10*3/uL (ref 4.0–10.5)

## 2017-04-01 LAB — POC URINE PREG, ED: Preg Test, Ur: NEGATIVE

## 2017-04-01 LAB — LIPASE, BLOOD: Lipase: 49 U/L (ref 11–51)

## 2017-04-01 NOTE — ED Triage Notes (Signed)
Pt complaining of abdominal pain. Pt states seen by PCP and UC for same. No resolution. Pt states emesis x 2 today. Pt denies any urinary symptoms. Pt denies any vaginal bleeding/discharge.

## 2017-04-02 ENCOUNTER — Emergency Department (HOSPITAL_COMMUNITY)
Admission: EM | Admit: 2017-04-02 | Discharge: 2017-04-02 | Disposition: A | Discharge status: Home / Self Care | Payer: Medicaid Other | Attending: Emergency Medicine | Admitting: Emergency Medicine

## 2017-04-02 ENCOUNTER — Emergency Department (HOSPITAL_COMMUNITY): Payer: Medicaid Other

## 2017-04-02 DIAGNOSIS — N9489 Other specified conditions associated with female genital organs and menstrual cycle: Secondary | ICD-10-CM

## 2017-04-02 DIAGNOSIS — K3184 Gastroparesis: Secondary | ICD-10-CM

## 2017-04-02 MED ORDER — ONDANSETRON HCL 4 MG PO TABS: 4.0000 mg | ORAL_TABLET | Freq: Four times a day (QID) | ORAL | 0 refills | Status: DC

## 2017-04-02 MED ORDER — IOPAMIDOL (ISOVUE-300) INJECTION 61%
INTRAVENOUS | Status: AC
  Administered 2017-04-02: 100 mL
  Filled 2017-04-02: qty 100

## 2017-04-02 MED ORDER — DICYCLOMINE HCL 20 MG PO TABS: 20.0000 mg | ORAL_TABLET | Freq: Two times a day (BID) | ORAL | 0 refills | Status: DC

## 2017-04-02 NOTE — ED Provider Notes (Signed)
[facility] DEPT Provider Note   CSN: [id number] Arrival date & time: 04/01/17  2239     History   Chief Complaint Chief Complaint  Patient presents with  . Abdominal Pain    HPI Claire Bray is a 41 y.o. female.  Patient presents to the emergency department with chief complaint of right lower abdominal pain. She states that she has been having the symptoms intermittently since January, but they acutely worsen over the past several days. She states that now she is having persistent vomiting as well as diarrhea. She was seen by her PCP as well by urgent care, but patient reports no improvement since seeing them. She denies any prior abdominal surgeries. She states that she is currently on her menstrual cycle. She denies any fever, chills, chest pain, shortness of breath, dysuria, hematuria, or vaginal discharge.   The history is provided by the patient. The history is limited by a language barrier. A language interpreter was used.    History reviewed. No pertinent past medical history.  There are no active problems to display for this patient.   History reviewed. No pertinent surgical history.  OB History    No data available       Home Medications    Prior to Admission medications   Not on File    Family History History reviewed. No pertinent family history.  Social History Social History  Substance Use Topics  . Smoking status: Never Smoker  . Smokeless tobacco: Never Used  . Alcohol use No     Allergies   Patient has no allergy information on record.   Review of Systems Review of Systems  All other systems reviewed and are negative.    Physical Exam Updated Vital Signs BP (!) 142/100 (BP Location: Right Arm)   Pulse 75   Temp 98.2 F (36.8 C)   Resp 14   LMP 04/01/2017 (Exact Date)   SpO2 100%   Physical Exam  Constitutional: She is oriented to person, place, and time. She appears well-developed and well-nourished.  HENT:  Head:  Normocephalic and atraumatic.  Eyes: Conjunctivae and EOM are normal. Pupils are equal, round, and reactive to light.  Neck: Normal range of motion. Neck supple.  Cardiovascular: Normal rate and regular rhythm.  Exam reveals no gallop and no friction rub.   No murmur heard. Pulmonary/Chest: Effort normal and breath sounds normal. No respiratory distress. She has no wheezes. She has no rales. She exhibits no tenderness.  Abdominal: Soft. Bowel sounds are normal. She exhibits no distension and no mass. There is tenderness. There is no rebound and no guarding.  RLQ TTP  Musculoskeletal: Normal range of motion. She exhibits no edema or tenderness.  Neurological: She is alert and oriented to person, place, and time.  Skin: Skin is warm and dry.  Psychiatric: She has a normal mood and affect. Her behavior is normal. Judgment and thought content normal.  Nursing note and vitals reviewed.    ED Treatments / Results  Labs (all labs ordered are listed, but only abnormal results are displayed) Labs Reviewed  COMPREHENSIVE METABOLIC PANEL - Abnormal; Notable for the following:       Result Value   Alkaline Phosphatase 35 (*)    All other components within normal limits  URINALYSIS, ROUTINE W REFLEX MICROSCOPIC - Abnormal; Notable for the following:    APPearance HAZY (*)    Hgb urine dipstick MODERATE (*)    Ketones, ur 5 (*)    Leukocytes,  UA TRACE (*)    Bacteria, UA RARE (*)    Squamous Epithelial / LPF 0-5 (*)    All other components within normal limits  LIPASE, BLOOD  CBC  POC URINE PREG, ED    EKG  EKG Interpretation None       Radiology Ct Abdomen Pelvis W Contrast  Result Date: 04/02/2017 CLINICAL DATA:  Chronic right lower quadrant abdominal pain for 3 months. Initial encounter. EXAM: CT ABDOMEN AND PELVIS WITH CONTRAST TECHNIQUE: Multidetector CT imaging of the abdomen and pelvis was performed using the standard protocol following bolus administration of intravenous  contrast. CONTRAST:  142mL ISOVUE-300 IOPAMIDOL (ISOVUE-300) INJECTION 61% COMPARISON:  None. FINDINGS: Lower chest: The visualized lung bases are grossly clear. The visualized portions of the mediastinum are unremarkable. Hepatobiliary: The liver is unremarkable in appearance. The gallbladder is unremarkable in appearance. The common bile duct remains normal in caliber. Pancreas: The pancreas is within normal limits. Spleen: The spleen is not well characterized due to artifact from barium within the patient's stomach. Adrenals/Urinary Tract: The adrenal glands are unremarkable in appearance. The kidneys are within normal limits. There is no evidence of hydronephrosis. No renal or ureteral stones are identified. No perinephric stranding is seen. The left kidney is difficult to fully characterize due to artifact from barium. Stomach/Bowel: There is a moderate amount of residual barium within the stomach, from the procedure performed 2 weeks ago. This is compatible with significant gastroparesis. The stomach is otherwise partially filled with fluid and air. Residual barium is noted throughout the small and large bowel, reflecting diffuse dysmotility. There is no evidence of bowel decompression to suggest bowel obstruction at this time. The small bowel is largely distended with fluid and air. Contrast is noted within the appendix, along the right hemipelvis, best seen on coronal images. There is no evidence for appendicitis. Vascular/Lymphatic: The abdominal aorta is unremarkable in appearance. The inferior vena cava is grossly unremarkable. No retroperitoneal lymphadenopathy is seen. No pelvic sidewall lymphadenopathy is identified. Reproductive: The bladder is decompressed and not well assessed. The uterus is grossly unremarkable in appearance. The ovaries are relatively symmetric. No suspicious adnexal masses are seen. There is prominence of the ovarian and periuterine vasculature bilaterally, worse on the left,  which could reflect pelvic congestion syndrome in the appropriate clinical situation. Other: No additional soft tissue abnormalities are seen. Musculoskeletal: No acute osseous abnormalities are identified. The visualized musculature is unremarkable in appearance. IMPRESSION: 1. Moderate amount of residual barium within the stomach, from the procedure performed 2 weeks ago, compatible with significant gastroparesis. Stomach otherwise partially filled with fluid and air. 2. Residual barium throughout the small and large bowel, reflecting diffuse bowel dysmotility. No evidence for bowel obstruction. The small bowel is largely distended with fluid and air. 3. No evidence of appendicitis.  Contrast noted within the appendix. 4. Prominence of the ovarian and periuterine vasculature bilaterally, worse on the left, which could reflect pelvic congestion syndrome in the appropriate clinical situation. Electronically Signed   By: Garald Balding M.D.   On: 04/02/2017 04:05    Procedures Procedures (including critical care time)  Medications Ordered in ED Medications - No data to display   Initial Impression / Assessment and Plan / ED Course  I have reviewed the triage vital signs and the nursing notes.  Pertinent labs & imaging results that were available during my care of the patient were reviewed by me and considered in my medical decision making (see chart for details).  Patient with waxing and waning RLQ abdominal pain a x months, now with nausea, vomiting, and diarrhea.  Will check CT.  CT as above.  Shows findings consistent with gastroparesis and pelvic congestion.  VSS.  Labs are reassuring.  Recommend follow-up with GI and OBGYN.  I discussed the findings with the patient and family, who understand and agrees with the plan.  Final Clinical Impressions(s) / ED Diagnoses   Final diagnoses:  Gastroparesis  Pelvic congestion syndrome    New Prescriptions New Prescriptions   DICYCLOMINE  (BENTYL) 20 MG TABLET    Take 1 tablet (20 mg total) by mouth 2 (two) times daily.   ONDANSETRON (ZOFRAN) 4 MG TABLET    Take 1 tablet (4 mg total) by mouth every 6 (six) hours.     Montine Circle, PA-C 04/02/17 Stockett, Daggett, MD 04/05/17 [phone]

## 2017-04-05 ENCOUNTER — Encounter (HOSPITAL_COMMUNITY): Payer: Self-pay

## 2017-04-06 ENCOUNTER — Encounter (HOSPITAL_COMMUNITY): Payer: Self-pay

## 2017-04-06 ENCOUNTER — Inpatient Hospital Stay (HOSPITAL_COMMUNITY)
Admission: AD | Admit: 2017-04-06 | Discharge: 2017-04-07 | Disposition: A | Discharge status: Home / Self Care | Payer: Medicaid Other | Source: Ambulatory Visit | Attending: Obstetrics and Gynecology | Admitting: Obstetrics and Gynecology

## 2017-04-06 DIAGNOSIS — K3184 Gastroparesis: Secondary | ICD-10-CM | POA: Insufficient documentation

## 2017-04-06 DIAGNOSIS — R109 Unspecified abdominal pain: Secondary | ICD-10-CM | POA: Insufficient documentation

## 2017-04-06 DIAGNOSIS — Z79899 Other long term (current) drug therapy: Secondary | ICD-10-CM | POA: Insufficient documentation

## 2017-04-06 DIAGNOSIS — R112 Nausea with vomiting, unspecified: Secondary | ICD-10-CM | POA: Diagnosis not present

## 2017-04-06 LAB — URINALYSIS, ROUTINE W REFLEX MICROSCOPIC
Bilirubin Urine: NEGATIVE
Glucose, UA: NEGATIVE mg/dL
HGB URINE DIPSTICK: NEGATIVE
Ketones, ur: 5 mg/dL — AB
Nitrite: NEGATIVE
PROTEIN: NEGATIVE mg/dL
RBC / HPF: NONE SEEN RBC/hpf (ref 0–5)
Specific Gravity, Urine: 1.024 (ref 1.005–1.030)
pH: 5 (ref 5.0–8.0)

## 2017-04-06 LAB — CBC
HEMATOCRIT: 36.8 % (ref 36.0–46.0)
Hemoglobin: 12.3 g/dL (ref 12.0–15.0)
MCH: 29.2 pg (ref 26.0–34.0)
MCHC: 33.4 g/dL (ref 30.0–36.0)
MCV: 87.4 fL (ref 78.0–100.0)
Platelets: 199 10*3/uL (ref 150–400)
RBC: 4.21 MIL/uL (ref 3.87–5.11)
RDW: 13.6 % (ref 11.5–15.5)
WBC: 4.8 10*3/uL (ref 4.0–10.5)

## 2017-04-06 LAB — COMPREHENSIVE METABOLIC PANEL
ALBUMIN: 3.5 g/dL (ref 3.5–5.0)
ALT: 10 U/L — ABNORMAL LOW (ref 14–54)
AST: 14 U/L — AB (ref 15–41)
Alkaline Phosphatase: 31 U/L — ABNORMAL LOW (ref 38–126)
Anion gap: 7 (ref 5–15)
BILIRUBIN TOTAL: 0.3 mg/dL (ref 0.3–1.2)
BUN: 10 mg/dL (ref 6–20)
CHLORIDE: 101 mmol/L (ref 101–111)
CO2: 28 mmol/L (ref 22–32)
Calcium: 8.7 mg/dL — ABNORMAL LOW (ref 8.9–10.3)
Creatinine, Ser: 0.7 mg/dL (ref 0.44–1.00)
GFR calc Af Amer: >60 mL/min (ref >60)
GFR calc non Af Amer: >60 mL/min (ref >60)
GLUCOSE: 90 mg/dL (ref 65–99)
POTASSIUM: 3.8 mmol/L (ref 3.5–5.1)
Sodium: 136 mmol/L (ref 135–145)
Total Protein: 6.3 g/dL — ABNORMAL LOW (ref 6.5–8.1)

## 2017-04-06 MED ORDER — METOCLOPRAMIDE HCL 5 MG/ML IJ SOLN
10.0000 mg | Freq: Once | INTRAMUSCULAR | Status: AC
  Administered 2017-04-06: 10 mg via INTRAVENOUS
  Filled 2017-04-06: qty 2

## 2017-04-06 MED ORDER — METOCLOPRAMIDE HCL 5 MG PO TABS: 10.0000 mg | ORAL_TABLET | Freq: Three times a day (TID) | ORAL | 1 refills | Status: DC

## 2017-04-06 MED ORDER — PROMETHAZINE HCL 25 MG/ML IJ SOLN: 25.0000 mg | Freq: Once | INTRAMUSCULAR | Status: DC

## 2017-04-06 MED ORDER — LACTATED RINGERS IV SOLN
INTRAVENOUS | Status: DC
  Administered 2017-04-06: 22:00:00 via INTRAVENOUS

## 2017-04-06 NOTE — Discharge Instructions (Signed)
Gastroparesis °Gastroparesis, also called delayed gastric emptying, is a condition in which food takes longer than normal to empty from the stomach. The condition is usually long-lasting (chronic). °What are the causes? °This condition may be caused by: °· An endocrine disorder, such as hypothyroidism or diabetes. Diabetes is the most common cause of this condition. °· A nervous system disease, such as Parkinson disease or multiple sclerosis. °· Cancer, infection, or surgery of the stomach or vagus nerve. °· A connective tissue disorder, such as scleroderma. °· Certain medicines. ° °In most cases, the cause is not known. °What increases the risk? °This condition is more likely to develop in: °· People with certain disorders, including endocrine disorders, eating disorders, amyloidosis, and scleroderma. °· People with certain diseases, including Parkinson disease or multiple sclerosis. °· People with cancer or infection of the stomach or vagus nerve. °· People who have had surgery on the stomach or vagus nerve. °· People who take certain medicines. °· Women. ° °What are the signs or symptoms? °Symptoms of this condition include: °· An early feeling of fullness when eating. °· Nausea. °· Weight loss. °· Vomiting. °· Heartburn. °· Abdominal bloating. °· Inconsistent blood glucose levels. °· Lack of appetite. °· Acid from the stomach coming up into the esophagus (gastroesophageal reflux). °· Spasms of the stomach. ° °Symptoms may come and go. °How is this diagnosed? °This condition is diagnosed with tests, such as: °· Tests that check how long it takes food to move through the stomach and intestines. These tests include: °? Upper gastrointestinal (GI) series. In this test, X-rays of the intestines are taken after you drink a liquid. The liquid makes the intestines show up better on the X-rays. °? Gastric emptying scintigraphy. In this test, scans are taken after you eat food that contains a small amount of radioactive  material. °? Wireless capsule GI monitoring system. This test involves swallowing a capsule that records information about movement through the stomach. °· Gastric manometry. This test measures electrical and muscular activity in the stomach. It is done with a thin tube that is passed down the throat and into the stomach. °· Endoscopy. This test checks for abnormalities in the lining of the stomach. It is done with a long, thin tube that is passed down the throat and into the stomach. °· An ultrasound. This test can help rule out gallbladder disease or pancreatitis as a cause of your symptoms. It uses sound waves to take pictures of the inside of your body. ° °How is this treated? °There is no cure for gastroparesis. This condition may be managed with: °· Treatment of the underlying condition causing the gastroparesis. °· Lifestyle changes, including exercise and dietary changes. Dietary changes can include: °? Changes in what and when you eat. °? Eating smaller meals more often. °? Eating low-fat foods. °? Eating low-fiber forms of high-fiber foods, such as cooked vegetables instead of raw vegetables. °? Having liquid foods in place of solid foods. Liquid foods are easier to digest. °· Medicines. These may be given to control nausea and vomiting and to stimulate stomach muscles. °· Getting food through a feeding tube. This may be done in severe cases. °· A gastric neurostimulator. This is a device that is inserted into the body with surgery. It helps improve stomach emptying and control nausea and vomiting. ° °Follow these instructions at home: °· Follow your health care provider's instructions about exercise and diet. °· Take medicines only as directed by your health care provider. °Contact a   health care provider if: °· Your symptoms do not improve with treatment. °· You have new symptoms. °Get help right away if: °· You have severe abdominal pain that does not improve with treatment. °· You have nausea that does  not go away. °· You cannot keep fluids down. °This information is not intended to replace advice given to you by your health care provider. Make sure you discuss any questions you have with your health care provider. °Document Released: 10/25/2005 Document Revised: 04/01/2016 Document Reviewed: 10/21/2014 °Elsevier Interactive Patient Education © 2018 [employer] ° °

## 2017-04-06 NOTE — MAU Provider Note (Addendum)
History     CSN: [id number]  Arrival date and time: 04/06/17 1959   None     Chief Complaint  Patient presents with  . Emesis  . Abdominal Pain   HPI Pt here with c/o N/V and abd pain. Has had problem for several months. Has had complete w/u including UGI and CT scan. Studies highly suggestive of gastroparesis. Has not made appt with GI yet as recommended by [facility] ER. Reports Zofran not helping with her N/V.     Past Medical History:  Diagnosis Date  . Medical history non-contributory   . No pertinent past medical history     Past Surgical History:  Procedure Laterality Date  . NO PAST SURGERIES      Family History  Problem Relation Age of Onset  . Other Neg Hx     Social History  Substance Use Topics  . Smoking status: Never Smoker  . Smokeless tobacco: Never Used  . Alcohol use No    Allergies: No Known Allergies  Prescriptions Prior to Admission  Medication Sig Dispense Refill Last Dose  . dicyclomine (BENTYL) 20 MG tablet Take 1 tablet (20 mg total) by mouth 2 (two) times daily. 20 tablet 0 04/06/2017 at 1800  . ondansetron (ZOFRAN) 4 MG tablet Take 1 tablet (4 mg total) by mouth every 6 (six) hours. 12 tablet 0 04/06/2017 at 1800  . amoxicillin (AMOXIL) 500 MG capsule Take 500 mg by mouth 3 (three) times daily. 7 day course, not yet completed   12/10/2012 at Unknown  . benzocaine-Menthol (DERMOPLAST) 20-0.5 % AERO Apply 1 application topically as needed (perineal discomfort). 56 g 0   . ibuprofen (ADVIL,MOTRIN) 600 MG tablet Take 1 tablet (600 mg total) by mouth every 6 (six) hours. 60 tablet 0   . Prenatal Vit-Fe Fumarate-FA (PRENATAL MULTIVITAMIN) TABS Take 1 tablet by mouth daily.   12/10/2012 at Unknown  . zolpidem (AMBIEN) 5 MG tablet Take 1 tablet (5 mg total) by mouth at bedtime as needed for sleep. 5 tablet 0     Review of Systems  Constitutional: Positive for appetite change and fatigue.  Respiratory: Negative.   Cardiovascular: Negative.    Gastrointestinal: Positive for abdominal pain, nausea and vomiting.  Genitourinary: Negative.    Physical Exam   Blood pressure 114/79, pulse 76, temperature 97.7 F (36.5 C), temperature source Oral, resp. rate 17, height 5\' 4"  (1.626 m), last menstrual period 04/01/2017, SpO2 100 %.  Physical Exam  Nursing note and vitals reviewed. Constitutional: She is oriented to person, place, and time. She appears well-developed.  Chronic ill appearing female  HENT:  Head: Normocephalic.  Cardiovascular: Normal rate, regular rhythm and normal heart sounds.   Respiratory: Effort normal and breath sounds normal.  GI: Soft. Bowel sounds are normal.  Epigastric tenderness, no rebound, no gaurding  Neurological: She is alert and oriented to person, place, and time.  Skin: Skin is warm and dry.  Psychiatric: She has a normal mood and affect.   Results for orders placed or performed during the hospital encounter of 04/06/17 (from the past 24 hour(s))  Urinalysis, Routine w reflex microscopic     Status: Abnormal   Collection Time: 04/06/17  8:05 PM  Result Value Ref Range   Color, Urine AMBER (A) YELLOW   APPearance CLOUDY (A) CLEAR   Specific Gravity, Urine 1.024 1.005 - 1.030   pH 5.0 5.0 - 8.0   Glucose, UA NEGATIVE NEGATIVE mg/dL   Hgb urine dipstick NEGATIVE NEGATIVE  Bilirubin Urine NEGATIVE NEGATIVE   Ketones, ur 5 (A) NEGATIVE mg/dL   Protein, ur NEGATIVE NEGATIVE mg/dL   Nitrite NEGATIVE NEGATIVE   Leukocytes, UA TRACE (A) NEGATIVE   RBC / HPF NONE SEEN 0 - 5 RBC/hpf   WBC, UA 6-30 0 - 5 WBC/hpf   Bacteria, UA RARE (A) NONE SEEN   Squamous Epithelial / LPF 0-5 (A) NONE SEEN   Mucous PRESENT    Ca Oxalate Crys, UA PRESENT   CBC     Status: None   Collection Time: 04/06/17 10:03 PM  Result Value Ref Range   WBC 4.8 4.0 - 10.5 K/uL   RBC 4.21 3.87 - 5.11 MIL/uL   Hemoglobin 12.3 12.0 - 15.0 g/dL   HCT 36.8 36.0 - 46.0 %   MCV 87.4 78.0 - 100.0 fL   MCH 29.2 26.0 - 34.0 pg    MCHC 33.4 30.0 - 36.0 g/dL   RDW 13.6 11.5 - 15.5 %   Platelets 199 150 - 400 K/uL  Comprehensive metabolic panel     Status: Abnormal   Collection Time: 04/06/17 10:03 PM  Result Value Ref Range   Sodium 136 135 - 145 mmol/L   Potassium 3.8 3.5 - 5.1 mmol/L   Chloride 101 101 - 111 mmol/L   CO2 28 22 - 32 mmol/L   Glucose, Bld 90 65 - 99 mg/dL   BUN 10 6 - 20 mg/dL   Creatinine, Ser 0.70 0.44 - 1.00 mg/dL   Calcium 8.7 (L) 8.9 - 10.3 mg/dL   Total Protein 6.3 (L) 6.5 - 8.1 g/dL   Albumin 3.5 3.5 - 5.0 g/dL   AST 14 (L) 15 - 41 U/L   ALT 10 (L) 14 - 54 U/L   Alkaline Phosphatase 31 (L) 38 - 126 U/L   Total Bilirubin 0.3 0.3 - 1.2 mg/dL   GFR calc non Af Amer >60 >60 mL/min   GFR calc Af Amer >60 >60 mL/min   Anion gap 7 5 - 15    MAU Course  Procedures Will give 1L of LR for nausea and vomiting 10mg  reglan now    Assessment and Plan   1. Gastroparesis   2. Non-intractable vomiting with nausea, unspecified vomiting type    DC home Comfort measures reviewed  RX: reglan 5mg  before meals, and at bedtime #120 Return to MAU as needed  Follow-up Information    Gastroenterology, [facility]. Schedule an appointment as soon as possible for a visit.   Contact information: [place] [id number] [phone]            Chancy Milroy 04/06/2017, 8:49 PM

## 2017-04-11 ENCOUNTER — Other Ambulatory Visit (HOSPITAL_COMMUNITY): Payer: Self-pay | Admitting: Physician Assistant

## 2017-04-11 DIAGNOSIS — R1010 Upper abdominal pain, unspecified: Secondary | ICD-10-CM

## 2017-04-11 DIAGNOSIS — R112 Nausea with vomiting, unspecified: Secondary | ICD-10-CM

## 2017-04-11 DIAGNOSIS — R932 Abnormal findings on diagnostic imaging of liver and biliary tract: Secondary | ICD-10-CM

## 2017-04-11 DIAGNOSIS — R634 Abnormal weight loss: Secondary | ICD-10-CM

## 2017-04-17 ENCOUNTER — Encounter (HOSPITAL_COMMUNITY): Payer: Self-pay | Admitting: Emergency Medicine

## 2017-04-17 ENCOUNTER — Emergency Department (HOSPITAL_COMMUNITY)
Admission: EM | Admit: 2017-04-17 | Discharge: 2017-04-17 | Disposition: A | Discharge status: Home / Self Care | Payer: Medicaid Other | Attending: Emergency Medicine | Admitting: Emergency Medicine

## 2017-04-17 DIAGNOSIS — I951 Orthostatic hypotension: Secondary | ICD-10-CM | POA: Insufficient documentation

## 2017-04-17 DIAGNOSIS — R112 Nausea with vomiting, unspecified: Secondary | ICD-10-CM | POA: Diagnosis present

## 2017-04-17 DIAGNOSIS — Z79899 Other long term (current) drug therapy: Secondary | ICD-10-CM | POA: Insufficient documentation

## 2017-04-17 DIAGNOSIS — K3184 Gastroparesis: Secondary | ICD-10-CM

## 2017-04-17 DIAGNOSIS — R42 Dizziness and giddiness: Secondary | ICD-10-CM

## 2017-04-17 LAB — COMPREHENSIVE METABOLIC PANEL
ALBUMIN: 3.6 g/dL (ref 3.5–5.0)
ALK PHOS: 31 U/L — AB (ref 38–126)
ALT: 20 U/L (ref 14–54)
AST: 23 U/L (ref 15–41)
Anion gap: 7 (ref 5–15)
BUN: 11 mg/dL (ref 6–20)
CALCIUM: 8.7 mg/dL — AB (ref 8.9–10.3)
CHLORIDE: 103 mmol/L (ref 101–111)
CO2: 26 mmol/L (ref 22–32)
CREATININE: 0.61 mg/dL (ref 0.44–1.00)
GFR calc Af Amer: >60 mL/min (ref >60)
GFR calc non Af Amer: >60 mL/min (ref >60)
GLUCOSE: 98 mg/dL (ref 65–99)
Potassium: 3.9 mmol/L (ref 3.5–5.1)
Sodium: 136 mmol/L (ref 135–145)
Total Bilirubin: 0.4 mg/dL (ref 0.3–1.2)
Total Protein: 6.4 g/dL — ABNORMAL LOW (ref 6.5–8.1)

## 2017-04-17 LAB — CBC
HCT: 40.6 % (ref 36.0–46.0)
HEMOGLOBIN: 12.8 g/dL (ref 12.0–15.0)
MCH: 28 pg (ref 26.0–34.0)
MCHC: 31.5 g/dL (ref 30.0–36.0)
MCV: 88.8 fL (ref 78.0–100.0)
PLATELETS: 224 10*3/uL (ref 150–400)
RBC: 4.57 MIL/uL (ref 3.87–5.11)
RDW: 13.7 % (ref 11.5–15.5)
WBC: 4.6 10*3/uL (ref 4.0–10.5)

## 2017-04-17 LAB — LIPASE, BLOOD: Lipase: 64 U/L — ABNORMAL HIGH (ref 11–51)

## 2017-04-17 MED ORDER — ENSURE ENLIVE PO LIQD
237.0000 mL | Freq: Once | ORAL | Status: AC
  Administered 2017-04-17: 237 mL via ORAL
  Filled 2017-04-17: qty 237

## 2017-04-17 MED ORDER — METOCLOPRAMIDE HCL 5 MG/ML IJ SOLN
10.0000 mg | Freq: Once | INTRAMUSCULAR | Status: AC
  Administered 2017-04-17: 10 mg via INTRAVENOUS
  Filled 2017-04-17: qty 2

## 2017-04-17 MED ORDER — PROMETHAZINE HCL 25 MG RE SUPP: 25.0000 mg | Freq: Four times a day (QID) | RECTAL | 0 refills | Status: DC | PRN

## 2017-04-17 MED ORDER — ONDANSETRON HCL 8 MG PO TABS: 8.0000 mg | ORAL_TABLET | Freq: Three times a day (TID) | ORAL | 0 refills | Status: DC | PRN

## 2017-04-17 MED ORDER — SODIUM CHLORIDE 0.9 % IV BOLUS (SEPSIS)
1000.0000 mL | Freq: Once | INTRAVENOUS | Status: AC
  Administered 2017-04-17: 1000 mL via INTRAVENOUS

## 2017-04-17 MED ORDER — OMEPRAZOLE 20 MG PO CPDR: 20.0000 mg | DELAYED_RELEASE_CAPSULE | Freq: Two times a day (BID) | ORAL | 0 refills | Status: DC

## 2017-04-17 MED ORDER — PANTOPRAZOLE SODIUM 40 MG PO TBEC
40.0000 mg | DELAYED_RELEASE_TABLET | Freq: Every day | ORAL | Status: DC
  Administered 2017-04-17: 40 mg via ORAL
  Filled 2017-04-17: qty 1

## 2017-04-17 NOTE — ED Triage Notes (Signed)
Pt here for N/V and abd pain; pt seen by GI with follow up later this month; pt generally weak with weight loss per family

## 2017-04-17 NOTE — ED Notes (Signed)
Emesis x 1 10 min after taking protonix.  700 ml.  MD notified.

## 2017-04-17 NOTE — ED Provider Notes (Signed)
[facility] DEPT Provider Note   CSN: [id number] Arrival date & time: 04/17/17  1336     History   Chief Complaint Chief Complaint  Patient presents with  . Emesis    HPI Claire Bray is a 41 y.o. female.  HPI Pt comes in with cc of dizziness, weakness, nausea and emesis. Pt's nausea and emesis have been present for 3 months. She reports that anytime she eats, she will have emesis few minutes later. She is able to keep water down when she drinks that in isolation. No blood in the emesis, and she continues to have BM. Pt has had 10 lbs weight loss. Pt has seen [facility] and has been in the ER. Pt is suspected to have gastroparesis and an EGD shows erosive ulcer with malignancy in the ddx. Pt is supposed to get gastric emptying study done on 20th June. Pt is on reglan, zofran for now. ROS is + for 10 lbs weight loss in 3 months. No night sweats.  Past Medical History:  Diagnosis Date  . Medical history non-contributory   . No pertinent past medical history     There are no active problems to display for this patient.   Past Surgical History:  Procedure Laterality Date  . NO PAST SURGERIES      OB History    Gravida Para Term Preterm AB Living   2 2 2  0 0 1   SAB TAB Ectopic Multiple Live Births   0 0 0   1       Home Medications    Prior to Admission medications   Medication Sig Start Date End Date Taking? Authorizing Provider  amoxicillin (AMOXIL) 500 MG capsule Take 500 mg by mouth 3 (three) times daily. 7 day course, not yet completed 12/06/12   [provider]  benzocaine-Menthol (DERMOPLAST) 20-0.5 % AERO Apply 1 application topically as needed (perineal discomfort). 12/14/12   Hess, Tamela Oddi, DO  dicyclomine (BENTYL) 20 MG tablet Take 1 tablet (20 mg total) by mouth 2 (two) times daily. 04/02/17   Montine Circle, PA-C  ibuprofen (ADVIL,MOTRIN) 600 MG tablet Take 1 tablet (600 mg total) by mouth every 6 (six) hours. 12/14/12   Hess, Tamela Oddi, DO    metoCLOPramide (REGLAN) 5 MG tablet Take 2 tablets (10 mg total) by mouth 4 (four) times daily -  before meals and at bedtime. 04/06/17   Marcille Buffy D, CNM  ondansetron (ZOFRAN) 4 MG tablet Take 1 tablet (4 mg total) by mouth every 6 (six) hours. 04/02/17   Montine Circle, PA-C  Prenatal Vit-Fe Fumarate-FA (PRENATAL MULTIVITAMIN) TABS Take 1 tablet by mouth daily.    [provider]  zolpidem (AMBIEN) 5 MG tablet Take 1 tablet (5 mg total) by mouth at bedtime as needed for sleep. 12/11/12   Martha Clan, MD    Family History Family History  Problem Relation Age of Onset  . Other Neg Hx     Social History Social History  Substance Use Topics  . Smoking status: Never Smoker  . Smokeless tobacco: Never Used  . Alcohol use No     Allergies   Patient has no known allergies.   Review of Systems Review of Systems  Constitutional: Positive for appetite change and unexpected weight change. Negative for diaphoresis.  HENT: Negative for trouble swallowing.   Gastrointestinal: Positive for abdominal pain, diarrhea and vomiting.  Neurological: Positive for weakness and light-headedness.  All other systems reviewed and are negative.  Physical Exam Updated Vital Signs BP 110/83 (BP Location: Left Arm)   Pulse 84   Temp 97.9 F (36.6 C) (Oral)   Resp 18   LMP 04/01/2017 (Exact Date)   SpO2 100%   Physical Exam  Constitutional: She is oriented to person, place, and time.  Skinny body habitus  HENT:  Head: Normocephalic and atraumatic.  Eyes: EOM are normal. No scleral icterus.  Neck: Normal range of motion. Neck supple.  Cardiovascular: Normal rate.   Pulmonary/Chest: Effort normal.  Abdominal: Bowel sounds are normal. There is tenderness. There is no rebound and no guarding.  Neurological: She is alert and oriented to person, place, and time.  Skin: Skin is warm and dry.  Nursing note and vitals reviewed.    ED Treatments / Results  Labs (all labs  ordered are listed, but only abnormal results are displayed) Labs Reviewed  LIPASE, BLOOD - Abnormal; Notable for the following:       Result Value   Lipase 64 (*)    All other components within normal limits  COMPREHENSIVE METABOLIC PANEL - Abnormal; Notable for the following:    Calcium 8.7 (*)    Total Protein 6.4 (*)    Alkaline Phosphatase 31 (*)    All other components within normal limits  CBC  URINALYSIS, ROUTINE W REFLEX MICROSCOPIC    EKG  EKG Interpretation None       Radiology No results found.  Procedures Procedures (including critical care time)  Medications Ordered in ED Medications  pantoprazole (PROTONIX) EC tablet 40 mg (40 mg Oral Given 04/17/17 1619)  metoCLOPramide (REGLAN) injection 10 mg (10 mg Intravenous Given 04/17/17 1619)  sodium chloride 0.9 % bolus 1,000 mL (1,000 mLs Intravenous New Bag/Given 04/17/17 1619)  feeding supplement (ENSURE ENLIVE) (ENSURE ENLIVE) liquid 237 mL (237 mLs Oral Given 04/17/17 1620)     Initial Impression / Assessment and Plan / ED Course  I have reviewed the triage vital signs and the nursing notes.  Pertinent labs & imaging results that were available during my care of the patient were reviewed by me and considered in my medical decision making (see chart for details).     Pt comes in with cc of abd pain, nausea and emesis. On exam - no signs of severe dehydration. Pt's abd exam is not peritoneal. We employed the translation services and discussed the results of her workup so far and the upcoming study. Pt will be hydrated since she has dizziness with standing. Anticipate d/c. We will add omeprazole and promethazine rectal suppository.   Final Clinical Impressions(s) / ED Diagnoses   Final diagnoses:  Orthostatic dizziness  Gastroparesis    New Prescriptions New Prescriptions   No medications on file     Varney Biles, MD 04/17/17 1755

## 2017-04-17 NOTE — Discharge Instructions (Signed)
All the results in the ER are normal. Please continue taking medicine as prescribed. Take the ensure as a food supplement.

## 2017-04-18 ENCOUNTER — Ambulatory Visit (HOSPITAL_BASED_OUTPATIENT_CLINIC_OR_DEPARTMENT_OTHER): Payer: Medicaid Other | Admitting: Hematology

## 2017-04-18 ENCOUNTER — Telehealth: Payer: Self-pay | Admitting: Hematology

## 2017-04-18 ENCOUNTER — Encounter: Payer: Self-pay | Admitting: Hematology

## 2017-04-18 DIAGNOSIS — R634 Abnormal weight loss: Secondary | ICD-10-CM | POA: Diagnosis not present

## 2017-04-18 DIAGNOSIS — R630 Anorexia: Secondary | ICD-10-CM

## 2017-04-18 DIAGNOSIS — E440 Moderate protein-calorie malnutrition: Secondary | ICD-10-CM | POA: Diagnosis not present

## 2017-04-18 DIAGNOSIS — R109 Unspecified abdominal pain: Secondary | ICD-10-CM | POA: Diagnosis not present

## 2017-04-18 DIAGNOSIS — R112 Nausea with vomiting, unspecified: Secondary | ICD-10-CM | POA: Diagnosis not present

## 2017-04-18 DIAGNOSIS — C164 Malignant neoplasm of pylorus: Secondary | ICD-10-CM

## 2017-04-18 DIAGNOSIS — C169 Malignant neoplasm of stomach, unspecified: Secondary | ICD-10-CM | POA: Insufficient documentation

## 2017-04-18 NOTE — Telephone Encounter (Signed)
Received a call fromChristina at Dr. Erlinda Hong office to schedule the pt an appt for to day at 3:15pm. Pt has been scheduled to see Dr. Burr Medico today, 6/11 at 315pm. Dr. Paulita Fujita gave the appt date and time to the pt's husband. Aware to arrive at 245pm.

## 2017-04-18 NOTE — Telephone Encounter (Signed)
Scheduled f/u in one week per 6/11 los - Gave patient AVS and calender . Referral will be contacted once referral order is in.

## 2017-04-18 NOTE — Progress Notes (Signed)
[facility] [place]  [phone]) [phone] [phone]  Clinic New Consult Note   Patient Care Team: Patient, No Pcp Per as PCP - General (General Practice) Patient, No Pcp Per (General Practice) 04/18/2017  Referring physician: Dr. Paulita Fujita  CHIEF COMPLAINTS/PURPOSE OF CONSULTATION:  Adenocarcinoma of the stomach    Gastric cancer ([facility])   04/12/2017 Procedure    Endoscopy by Dr. Paulita Fujita 04/12/17 A large, fungating, infiltrative and ulcerated, non-circumferential mass with no bleeding was found in the prepyloric region of the stomach, extending to the pylorus distally and proximally along the lesser cure of the antrum and body. Mass more ulcer in appearance with heaped-up margins. Tight pinhole pyloric channel, unable to transverse this area with the endoscope.      04/12/2017 Initial Biopsy    FINAL MICROSCOPIC DIAGNOSIS 04/12/17 Stomach, Bx: ADENOCARCINOMA, HIGH GRADE, SIGNET RING CELL SUBTYPE, INVASIVE      04/18/2017 Initial Diagnosis    Gastric cancer ([facility])      HISTORY OF PRESENTING ILLNESS:  Claire Bray 41 y.o. female is here because of a new diagnosis of adenocarcinoma of the stomach.  The patient presented to the ED on 04/02/17 complaining of right lower abdominal pain that has been intermittent since January 2018. The pain worsened with an onset of emesis and diarrhea. The patient also had weight loss of approximately 25 lbs. Workup CT ABD/PEL showed significant gastroparesis, diffuse bowel dysmotility, and the small bowel was largely distended with fluid and air.  The patient presented to Deliah Goody, PA of [facility] on 04/11/17. By then,. On abdominal exam, a mass was palpated in the right middle and bilateral lower abdomen - questionable stool in the colon/instestines. The patient underwent an endoscopy by Dr. Paulita Fujita on 04/12/17. This revealed a large, fungating, infiltrative mass in the prepyloric region of the stomach extending to the pylorus  distally and proximally along the lesser curve of the antrum and body. Biopsies were obtained. This revealed adenocarcinoma, high grade, signet ring cell subtype, invasive. Tumor marker testing is pending.  The patient has been referred today to discuss systemic treatment for the management of her disease. Her husband and two young children were present during the encounter. We had [employer] video interpreter during this encounter who spoke [place].  She is only able to drink liquids; such as milk or soups. She has emesis when she eat solids. She is supplementing with Boost/Ensure with 1.5 - 2 a day. She reports less emesis with Ensure. The nausea is better is she does not eat anything. She reports her stool is normal in color. The patient reports she lost 25 lbs in the last 6 months. She continues to have abdominal pain.  The patient reports she is not using anything for contraception at this time. She used Depo-Provera in the past.  MEDICAL HISTORY:  Past Medical History:  Diagnosis Date  . Medical history non-contributory   . No pertinent past medical history     SURGICAL HISTORY: Past Surgical History:  Procedure Laterality Date  . NO PAST SURGERIES      SOCIAL HISTORY: Social History   Social History  . Marital status: Married    Spouse name: N/A  . Number of children: N/A  . Years of education: N/A   Occupational History  . Not on file.   Social History Main Topics  . Smoking status: Never Smoker  . Smokeless tobacco: Never Used  . Alcohol use No  . Drug use: No  . Sexual  activity: Not Currently   Other Topics Concern  . Not on file   Social History Narrative   ** Merged History Encounter **       The patient has 5 siblings. She is the oldest. She is married with 2 children. She has an 44 yo son and a 8 yo daughter. 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 Her husband works for [employer].  FAMILY HISTORY: Family History  Problem Relation Age of  Onset  . Hypertension Mother   . Heart disease Mother   . Other Neg Hx     ALLERGIES:  has No Known Allergies.  MEDICATIONS:  Current Outpatient Prescriptions  Medication Sig Dispense Refill  . metoCLOPramide (REGLAN) 5 MG tablet Take 2 tablets (10 mg total) by mouth 4 (four) times daily -  before meals and at bedtime. 120 tablet 1  . dicyclomine (BENTYL) 20 MG tablet Take 1 tablet (20 mg total) by mouth 2 (two) times daily. (Patient not taking: Reported on 04/18/2017) 20 tablet 0  . ibuprofen (ADVIL,MOTRIN) 600 MG tablet Take 1 tablet (600 mg total) by mouth every 6 (six) hours. (Patient not taking: Reported on 04/18/2017) 60 tablet 0  . omeprazole (PRILOSEC) 20 MG capsule Take 1 capsule (20 mg total) by mouth 2 (two) times daily before a meal. (Patient not taking: Reported on 04/18/2017) 60 capsule 0  . ondansetron (ZOFRAN) 8 MG tablet Take 1 tablet (8 mg total) by mouth every 8 (eight) hours as needed for nausea or vomiting. (Patient not taking: Reported on 04/18/2017) 30 tablet 0  . Prenatal Vit-Fe Fumarate-FA (PRENATAL MULTIVITAMIN) TABS Take 1 tablet by mouth daily.    . promethazine (PHENERGAN) 25 MG suppository Place 1 suppository (25 mg total) rectally every 6 (six) hours as needed for nausea. (Patient not taking: Reported on 04/18/2017) 12 each 0  . zolpidem (AMBIEN) 5 MG tablet Take 1 tablet (5 mg total) by mouth at bedtime as needed for sleep. (Patient not taking: Reported on 04/18/2017) 5 tablet 0   No current facility-administered medications for this visit.     REVIEW OF SYSTEMS:   Constitutional: Denies fevers, chills or abnormal night sweats (+) Weight loss Eyes: Denies blurriness of vision, double vision or watery eyes Ears, nose, mouth, throat, and face: Denies mucositis or sore throat Respiratory: Denies cough, dyspnea or wheezes Cardiovascular: Denies palpitation, chest discomfort or lower extremity swelling Gastrointestinal:  Denies heartburn or change in bowel habits (+)  Nausea (+) Emesis (+) Abdominal pain Skin: Denies abnormal skin rashes Lymphatics: Denies new lymphadenopathy or easy bruising Neurological:Denies numbness, tingling or new weaknesses Behavioral/Psych: Mood is stable, no new changes  All other systems were reviewed with the patient and are negative.  PHYSICAL EXAMINATION: ECOG PERFORMANCE STATUS: 1 - Symptomatic but completely ambulatory  Vitals:   04/18/17 1522  BP: 100/78  Pulse: 92  Resp: 18  Temp: 98.4 F (36.9 C)   Filed Weights   04/18/17 1522  Weight: 86 lb 11.2 oz (39.3 kg)    GENERAL:alert, no distress and comfortable, cachectic SKIN: skin color, texture, turgor are normal, no rashes or significant lesions EYES: normal, conjunctiva are pink and non-injected, sclera clear OROPHARYNX:no exudate, no erythema and lips, buccal mucosa, and tongue normal  NECK: supple, thyroid normal size, non-tender, without nodularity LYMPH:  no palpable lymphadenopathy in the cervical, axillary or inguinal LUNGS: clear to auscultation and percussion with normal breathing effort HEART: regular rate & rhythm and no murmurs and  no lower extremity edema ABDOMEN:abdomen soft and normal bowel sounds (+) Tenderness in the epigastric area Musculoskeletal:no cyanosis of digits and no clubbing  PSYCH: alert & oriented x 3 with fluent speech NEURO: no focal motor/sensory deficits  LABORATORY DATA:  I have reviewed the data as listed CBC Latest Ref Rng & Units 04/17/2017 04/06/2017 04/01/2017  WBC 4.0 - 10.5 K/uL 4.6 4.8 6.1  Hemoglobin 12.0 - 15.0 g/dL 12.8 12.3 14.0  Hematocrit 36.0 - 46.0 % 40.6 36.8 41.9  Platelets 150 - 400 K/uL 224 199 240    CMP Latest Ref Rng & Units 04/17/2017 04/06/2017 04/01/2017  Glucose 65 - 99 mg/dL 98 90 98  BUN 6 - 20 mg/dL _0 Creatinine 0.44 - 1.00 mg/dL 0.61 0.70 0.73  Sodium 135 - 145 mmol/L 136 136 136  Potassium 3.5 - 5.1 mmol/L 3.9 3.8 3.6  Chloride 101 - 111 mmol/L 103 101 103  CO2 22 - 32 mmol/L  _1 Calcium 8.9 - 10.3 mg/dL 8.7(L) 8.7(L) 9.3  Total Protein 6.5 - 8.1 g/dL 6.4(L) 6.3(L) 7.4  Total Bilirubin 0.3 - 1.2 mg/dL 0.4 0.3 0.5  Alkaline Phos 38 - 126 U/L 31(L) 31(L) 35(L)  AST 15 - 41 U/L 23 14(L) 20  ALT 14 - 54 U/L 20 10(L) 16    PATHOLOGY: FINAL MICROSCOPIC DIAGNOSIS 04/12/17 Stomach, Bx: ADENOCARCINOMA, HIGH GRADE, SIGNET RING CELL SUBTYPE, INVASIVE HER-2 NEU TESTING WILL BE SENT TO [facility] AND THE RESULT WILL BE FOLLOWED AS AN ADDEDUM, CYTO-7, CDX-2, ZOXW-96 AND HELICOBACTER IP STAINS PENDING This case was discussed with Dr. Paulita Fujita on 04/14/2017. PER DR. Paulita Fujita, THE LESION IS AN ULCERATED MASS, MALIGNANT APPEARANCE, CARCINOMA VS LYMHOMA, HE CALLED AND DISCUSSED THIS AT 04/13/2017 AND SPOKE WITH DR. Jacky Kindle M.D., Pathologyist (Case signed 04/14/2017)  PROCEDURES: Endoscopy by Dr. Paulita Fujita 04/12/17 The examined esophagus was normal. A large amount of food (residue) was found in the gastric fundus, in the gastric body, and in the gastric antrum. A large, fungating, infiltrative and ulcerated, non-circumferential mass with no bleeding was found in the prepyloric region of the stomach, extending to the pylorus distally and proximally along the lesser cure of the antrum and body. Mass more ulcer in appearance with heaped-up margins. Biopsies were taken with a cold forceps for histology. Tight pinhole pyloric channel, unable to transverse this area with the endoscope.   RADIOGRAPHIC STUDIES: I have personally reviewed the radiological images as listed and agreed with the findings in the report. Ct Abdomen Pelvis W Contrast  Result Date: 04/02/2017 CLINICAL DATA:  Chronic right lower quadrant abdominal pain for 3 months. Initial encounter. EXAM: CT ABDOMEN AND PELVIS WITH CONTRAST TECHNIQUE: Multidetector CT imaging of the abdomen and pelvis was performed using the standard protocol following bolus administration of intravenous contrast. CONTRAST:  174m  ISOVUE-300 IOPAMIDOL (ISOVUE-300) INJECTION 61% COMPARISON:  None. FINDINGS: Lower chest: The visualized lung bases are grossly clear. The visualized portions of the mediastinum are unremarkable. Hepatobiliary: The liver is unremarkable in appearance. The gallbladder is unremarkable in appearance. The common bile duct remains normal in caliber. Pancreas: The pancreas is within normal limits. Spleen: The spleen is not well characterized due to artifact from barium within the patient's stomach. Adrenals/Urinary Tract: The adrenal glands are unremarkable in appearance. The kidneys are within normal limits. There is no evidence of hydronephrosis. No renal or ureteral stones are identified. No perinephric stranding is seen. The left kidney is difficult to fully characterize due  to artifact from barium. Stomach/Bowel: There is a moderate amount of residual barium within the stomach, from the procedure performed 2 weeks ago. This is compatible with significant gastroparesis. The stomach is otherwise partially filled with fluid and air. Residual barium is noted throughout the small and large bowel, reflecting diffuse dysmotility. There is no evidence of bowel decompression to suggest bowel obstruction at this time. The small bowel is largely distended with fluid and air. Contrast is noted within the appendix, along the right hemipelvis, best seen on coronal images. There is no evidence for appendicitis. Vascular/Lymphatic: The abdominal aorta is unremarkable in appearance. The inferior vena cava is grossly unremarkable. No retroperitoneal lymphadenopathy is seen. No pelvic sidewall lymphadenopathy is identified. Reproductive: The bladder is decompressed and not well assessed. The uterus is grossly unremarkable in appearance. The ovaries are relatively symmetric. No suspicious adnexal masses are seen. There is prominence of the ovarian and periuterine vasculature bilaterally, worse on the left, which could reflect pelvic  congestion syndrome in the appropriate clinical situation. Other: No additional soft tissue abnormalities are seen. Musculoskeletal: No acute osseous abnormalities are identified. The visualized musculature is unremarkable in appearance. IMPRESSION: 1. Moderate amount of residual barium within the stomach, from the procedure performed 2 weeks ago, compatible with significant gastroparesis. Stomach otherwise partially filled with fluid and air. 2. Residual barium throughout the small and large bowel, reflecting diffuse bowel dysmotility. No evidence for bowel obstruction. The small bowel is largely distended with fluid and air. 3. No evidence of appendicitis.  Contrast noted within the appendix. 4. Prominence of the ovarian and periuterine vasculature bilaterally, worse on the left, which could reflect pelvic congestion syndrome in the appropriate clinical situation. Electronically Signed   By: Garald Balding M.D.   On: 04/02/2017 04:05   Dg Ugi W/high Density W/kub  Result Date: 03/21/2017 CLINICAL DATA:  41 year old female with history of epigastric pain and intractable nausea and vomiting after eating. Weight loss. EXAM: UPPER GI SERIES WITH KUB TECHNIQUE: After obtaining a scout radiograph a routine upper GI series was performed using thin and high density barium. FLUOROSCOPY TIME:  Fluoroscopy Time:  5 minutes and 6 seconds Radiation Exposure Index (if provided by the fluoroscopic device): 89 mGy COMPARISON:  No priors. FINDINGS: Initial KUB demonstrates distended stomach. Bowel gas pattern is nonobstructive. No pneumoperitoneum. Double contrast images demonstrated a normal appearance of the esophageal mucosa. Multiple single swallow attempts were observed, which demonstrated normal esophageal motility. Full column esophagram demonstrated no esophageal mass, stricture or esophageal ring. No hiatal hernia. Water siphon test demonstrated no gastroesophageal reflux. A barium tablet was administered, which  passed readily into the stomach. Imaging of the stomach was exceedingly limited by a large amount of gastric contents. The overall appearance is most compatible with a bezoar. Accurate assessment of the gastric mucosa and exclusion of underlying gastric mass is not possible on today's examination. Duodenal bulb was poorly imaged. IMPRESSION: 1. Highly unusual appearance of the stomach, which appears filled with retained food material, presumably a large gastric bezoar. This suggests a history of gastric outlet obstruction or gastroparesis. Outpatient referral to Gastroenterology for further evaluation is strongly recommended. Electronically Signed   By: Vinnie Langton M.D.   On: 03/21/2017 12:41    ASSESSMENT & PLAN: 41 y.o. [place] woman who presented with abdominal pain, nausea, and emesis for 4-5 months and significant weight loss   1. Gastric adenocarcinoma, prepylori area, high grade, signet ring cell subtype -I reviewed her CT scan, endoscopy finding and the  biopsy results with patient and her husband in great details, through a vedio interpreter.  -The patient had an endoscopy by Dr. Paulita Fujita on 04/12/17 revealing a mass in the prepyloric region of the stomach. Biopsy of the mass revealed high grade adenocarcinoma of stomach. -I recommend further staging workup that includes an EUS by Dr. Paulita Fujita and a CT of the chest to determine the extent of disease. -We discussed surgical referral to Dr. Barry Dienes. -We discussed the higher risk of recurrence with surgical resection alone, and the benefit of chemotherapy to reduce risk of recurrence. -We discussed possible neoadjuvant chemotherapy to reduce the size of her tumor prior to surgery. Based on the FLOT4 phase 3 data, I recommend chemo with docetaxel, 5-FU and oxaliplatin (FLOT) before and after surgery.  -Due to her significant weight loss, significant pyloric outlet fo obstruction,  I recommend her to get a J-tube placement by Dr. Barry Dienes for  nutritional support, so she can tolerate chemotherapy and surgery better.  -We discussed that she should employ contraceptive measures to prevent pregnancy. -We discussed that she is working a lot of hours with 12 hours a day and 6 days a week. I advised her to cut her hours once she starts treatment to help with her healing. -The patient gave [place] permission to call her friend, Glory Rosebush, and the patient's husband with any medical information.  2. Abdominal pain, nausea and vomiting  -Secondary to her gastric cancer and pyloric outlet obstruction -She will continue omeprazole, and use Zofran, Reglan, and Phenergan as needed for nausea   3. Anorexia and weight loss, moderate calorie and protein malnutrition  -The patient has over a 25 lb weight loss in the past 6 months and she currently weighs 87 lbs. This is secondary of her disease -She has a poor appetite with her eating habits impacted by nausea, emesis, and abdominal pain. -She will likely need insertion of a J-tube in order to increase her caloric intake. -Dietitian consult   PLAN -CT chest scan within 1 week -Referral to surgery Dr. Barry Dienes ASAP -EUS by Dr. Paulita Fujita, I sent a message to Dr. Paulita Fujita  -F/u in 1 week at [place] to finalize her treatment plan.   Orders Placed This Encounter  Procedures  . CT Chest W Contrast    Standing Status:   Future    Standing Expiration Date:   04/18/2018    Order Specific Question:   If indicated for the ordered procedure, I authorize the administration of contrast media per Radiology protocol    Answer:   Yes    Order Specific Question:   Reason for Exam (SYMPTOM  OR DIAGNOSIS REQUIRED)    Answer:   staging    Order Specific Question:   Is patient pregnant?    Answer:   No    Order Specific Question:   Preferred imaging location?    Answer:   [facility]    Order Specific Question:   Radiology Contrast Protocol - do NOT remove file path    Answer:    [email or web address]  . CBC with Differential    Standing Status:   Standing    Number of Occurrences:   50    Standing Expiration Date:   04/18/2022  . Comprehensive metabolic panel    Standing Status:   Standing    Number of Occurrences:   50    Standing Expiration Date:   04/18/2022  . CEA    Standing Status:   Standing  Number of Occurrences:   50    Standing Expiration Date:   04/18/2022  . Ambulatory referral to General Surgery    Referral Priority:   Urgent    Referral Type:   Surgical    Referral Reason:   Specialty Services Required    Requested Specialty:   General Surgery    Number of Visits Requested:   1    All questions were answered. The patient knows to call the clinic with any problems, questions or concerns. I spent 70 minutes counseling the patient face to face, by using the medial interpreting service. The total time spent in the appointment was 80 minutes and more than 50% was on counseling.     Truitt Merle, MD 04/18/2017 9:22 PM  This document serves as a record of services personally performed by Truitt Merle, MD. It was created on her behalf by Darcus Austin, a trained medical scribe. The creation of this record is based on the scribe's personal observations and the provider's statements to them. This document has been checked and approved by the attending provider.

## 2017-04-19 ENCOUNTER — Encounter (HOSPITAL_COMMUNITY): Payer: Self-pay | Admitting: General Practice

## 2017-04-19 ENCOUNTER — Inpatient Hospital Stay (HOSPITAL_COMMUNITY): Payer: Medicaid Other

## 2017-04-19 ENCOUNTER — Telehealth: Payer: Self-pay | Admitting: *Deleted

## 2017-04-19 ENCOUNTER — Inpatient Hospital Stay (HOSPITAL_COMMUNITY)
Admission: AD | Admit: 2017-04-19 | Discharge: 2017-04-24 | DRG: 380 | Disposition: A | Discharge status: Home Health | Payer: Medicaid Other | Source: Ambulatory Visit | Attending: Hematology | Admitting: Hematology

## 2017-04-19 ENCOUNTER — Telehealth: Payer: Self-pay | Admitting: Hematology

## 2017-04-19 DIAGNOSIS — C786 Secondary malignant neoplasm of retroperitoneum and peritoneum: Secondary | ICD-10-CM | POA: Diagnosis present

## 2017-04-19 DIAGNOSIS — Z419 Encounter for procedure for purposes other than remedying health state, unspecified: Secondary | ICD-10-CM

## 2017-04-19 DIAGNOSIS — R109 Unspecified abdominal pain: Secondary | ICD-10-CM | POA: Diagnosis not present

## 2017-04-19 DIAGNOSIS — Z8249 Family history of ischemic heart disease and other diseases of the circulatory system: Secondary | ICD-10-CM | POA: Diagnosis not present

## 2017-04-19 DIAGNOSIS — Z95828 Presence of other vascular implants and grafts: Secondary | ICD-10-CM

## 2017-04-19 DIAGNOSIS — R112 Nausea with vomiting, unspecified: Secondary | ICD-10-CM

## 2017-04-19 DIAGNOSIS — K311 Adult hypertrophic pyloric stenosis: Principal | ICD-10-CM | POA: Diagnosis present

## 2017-04-19 DIAGNOSIS — C163 Malignant neoplasm of pyloric antrum: Secondary | ICD-10-CM | POA: Diagnosis not present

## 2017-04-19 DIAGNOSIS — C169 Malignant neoplasm of stomach, unspecified: Secondary | ICD-10-CM | POA: Diagnosis not present

## 2017-04-19 DIAGNOSIS — Z681 Body mass index (BMI) 19 or less, adult: Secondary | ICD-10-CM

## 2017-04-19 DIAGNOSIS — R630 Anorexia: Secondary | ICD-10-CM | POA: Diagnosis not present

## 2017-04-19 DIAGNOSIS — R1084 Generalized abdominal pain: Secondary | ICD-10-CM | POA: Diagnosis not present

## 2017-04-19 DIAGNOSIS — R1013 Epigastric pain: Secondary | ICD-10-CM | POA: Diagnosis present

## 2017-04-19 DIAGNOSIS — R634 Abnormal weight loss: Secondary | ICD-10-CM | POA: Diagnosis not present

## 2017-04-19 DIAGNOSIS — E43 Unspecified severe protein-calorie malnutrition: Secondary | ICD-10-CM | POA: Diagnosis not present

## 2017-04-19 HISTORY — DX: Malignant (primary) neoplasm, unspecified: C80.1

## 2017-04-19 LAB — APTT: aPTT: 31 seconds (ref 24–36)

## 2017-04-19 LAB — BASIC METABOLIC PANEL
Anion gap: 8 (ref 5–15)
BUN: 10 mg/dL (ref 6–20)
CALCIUM: 8.7 mg/dL — AB (ref 8.9–10.3)
CO2: 27 mmol/L (ref 22–32)
Chloride: 104 mmol/L (ref 101–111)
Creatinine, Ser: 0.57 mg/dL (ref 0.44–1.00)
GFR calc Af Amer: >60 mL/min (ref >60)
GLUCOSE: 95 mg/dL (ref 65–99)
Potassium: 4 mmol/L (ref 3.5–5.1)
Sodium: 139 mmol/L (ref 135–145)

## 2017-04-19 LAB — PREALBUMIN: Prealbumin: 17.3 mg/dL — ABNORMAL LOW (ref 18–38)

## 2017-04-19 LAB — PROTIME-INR
INR: 1.07
Prothrombin Time: 13.9 seconds (ref 11.4–15.2)

## 2017-04-19 MED ORDER — ALUM & MAG HYDROXIDE-SIMETH 200-200-20 MG/5ML PO SUSP: 60.0000 mL | ORAL | Status: DC | PRN

## 2017-04-19 MED ORDER — SENNOSIDES-DOCUSATE SODIUM 8.6-50 MG PO TABS: 1.0000 | ORAL_TABLET | Freq: Every evening | ORAL | Status: DC | PRN

## 2017-04-19 MED ORDER — ZOLPIDEM TARTRATE 5 MG PO TABS: 5.0000 mg | ORAL_TABLET | Freq: Every evening | ORAL | Status: DC | PRN

## 2017-04-19 MED ORDER — SODIUM CHLORIDE 0.9% FLUSH: 3.0000 mL | INTRAVENOUS | Status: DC | PRN

## 2017-04-19 MED ORDER — ACETAMINOPHEN 325 MG PO TABS
650.0000 mg | ORAL_TABLET | ORAL | Status: DC | PRN
  Administered 2017-04-22 – 2017-04-23 (×3): 650 mg via ORAL
  Filled 2017-04-19 (×3): qty 2

## 2017-04-19 MED ORDER — SODIUM CHLORIDE 0.9 % IV SOLN
8.0000 mg | Freq: Three times a day (TID) | INTRAVENOUS | Status: DC | PRN
  Filled 2017-04-19: qty 4

## 2017-04-19 MED ORDER — ONDANSETRON HCL 4 MG PO TABS: 4.0000 mg | ORAL_TABLET | Freq: Three times a day (TID) | ORAL | Status: DC | PRN

## 2017-04-19 MED ORDER — IOPAMIDOL (ISOVUE-300) INJECTION 61%
INTRAVENOUS | Status: AC
Start: 2017-04-19 — End: 2017-04-19
  Administered 2017-04-19: 75 mL
  Filled 2017-04-19: qty 75

## 2017-04-19 MED ORDER — SODIUM CHLORIDE 0.9 % IV SOLN: 250.0000 mL | INTRAVENOUS | Status: DC | PRN

## 2017-04-19 MED ORDER — ONDANSETRON HCL 4 MG/2ML IJ SOLN: 4.0000 mg | Freq: Three times a day (TID) | INTRAMUSCULAR | Status: DC | PRN

## 2017-04-19 MED ORDER — DEXTROSE-NACL 5-0.9 % IV SOLN
INTRAVENOUS | Status: DC
  Administered 2017-04-19 – 2017-04-23 (×5): via INTRAVENOUS

## 2017-04-19 MED ORDER — ONDANSETRON 4 MG PO TBDP
4.0000 mg | ORAL_TABLET | Freq: Three times a day (TID) | ORAL | Status: DC | PRN
  Filled 2017-04-19: qty 1

## 2017-04-19 MED ORDER — SODIUM CHLORIDE 0.9% FLUSH
3.0000 mL | Freq: Two times a day (BID) | INTRAVENOUS | Status: DC
  Administered 2017-04-19 – 2017-04-23 (×3): 3 mL via INTRAVENOUS

## 2017-04-19 NOTE — H&P (Signed)
[place]  [phone]   History and Physical Examination   Claire Bray  DOB: [birth year]  MR#: [id number]  CSN#: [id number]     Patient Care Team: Patient, No Pcp Per as PCP - General (General Practice) Patient, No Pcp Per (General Practice) Arta Silence, MD as Consulting Physician (Gastroenterology) Truitt Merle, MD as Consulting Physician (Hematology) Stark Klein, MD as Consulting Physician (General Surgery)  Chief complain: Abdominal pain, nausea and poor by mouth intake  History of present illness:   Claire Bray is a 41 year old [place]  woman, with newly diagnosed gastric cancer. She was seen by me in my office yesterday, and is being admitted to [facility] for management of abdominal pain, nausea and poor oral intake due to gastric outlet obstruction.   See my initial consult note from yesterday for her oncological history. I have discussed her case with surgeon Dr. Barry Dienes, and we decided to admit patient to [facility] for J-tube placement for her nutritional support. She denies any other new symptoms since her visit with me yesterday.  MEDICAL HISTORY:  Past Medical History:  Diagnosis Date  . Cancer (HCC)    STOMACH  . No pertinent past medical history     SURGICAL HISTORY: Past Surgical History:  Procedure Laterality Date  . NO PAST SURGERIES      SOCIAL HISTORY: Social History   Social History  . Marital status: Married    Spouse name: N/A  . Number of children: N/A  . Years of education: N/A   Occupational History  . Not on file.   Social History Main Topics  . Smoking status: Never Smoker  . Smokeless tobacco: Never Used  . Alcohol use No  . Drug use: No  . Sexual activity: Not Currently   Other Topics Concern  . Not on file   Social History Narrative   ** Merged History Encounter **        FAMILY HISTORY: Family History  Problem Relation Age of Onset  . Hypertension Mother   . Heart disease Mother    . Other Neg Hx     ALLERGIES:  has No Known Allergies.  MEDICATIONS:  Current Facility-Administered Medications  Medication Dose Route Frequency Provider Last Rate Last Dose  . 0.9 %  sodium chloride infusion  250 mL Intravenous PRN Truitt Merle, MD      . acetaminophen (TYLENOL) tablet 650 mg  650 mg Oral Q4H PRN Truitt Merle, MD      . alum & mag hydroxide-simeth (MAALOX/MYLANTA) 200-200-20 MG/5ML suspension 60 mL  60 mL Oral Q4H PRN Truitt Merle, MD      . dextrose 5 %-0.9 % sodium chloride infusion   Intravenous Continuous Truitt Merle, MD 50 mL/hr at 04/19/17 1733    . ondansetron (ZOFRAN) tablet 4-8 mg  4-8 mg Oral Q8H PRN Truitt Merle, MD       Or  . ondansetron (ZOFRAN-ODT) disintegrating tablet 4-8 mg  4-8 mg Oral Q8H PRN Truitt Merle, MD       Or  . ondansetron [facility]) injection 4 mg  4 mg Intravenous Q8H PRN Truitt Merle, MD       Or  . ondansetron (ZOFRAN) 8 mg in sodium chloride 0.9 % 50 mL IVPB  8 mg Intravenous Q8H PRN Truitt Merle, MD      . senna-docusate (Senokot-S) tablet 1 tablet  1 tablet Oral QHS PRN Truitt Merle, MD      . sodium chloride  flush (NS) 0.9 % injection 3 mL  3 mL Intravenous Q12H Truitt Merle, MD      . sodium chloride flush (NS) 0.9 % injection 3 mL  3 mL Intravenous PRN Truitt Merle, MD      . zolpidem Lorrin Mais) tablet 5 mg  5 mg Oral QHS PRN Truitt Merle, MD        REVIEW OF SYSTEMS:   Constitutional: Denies fevers, chills or abnormal night sweats, (+) fatigue and 25 pound weight loss Eyes: Denies blurriness of vision, double vision or watery eyes Ears, nose, mouth, throat, and face: Denies mucositis or sore throat Respiratory: Denies cough, dyspnea or wheezes Cardiovascular: Denies palpitation, chest discomfort or lower extremity swelling Gastrointestinal:  (+) Epigastric pain after eating, associated with nausea and vomiting, no heartburn or change in bowel habits Skin: Denies abnormal skin rashes Lymphatics: Denies new lymphadenopathy or easy bruising Neurological:Denies  numbness, tingling or new weaknesses Behavioral/Psych: Mood is stable, no new changes  All other systems were reviewed with the patient and are negative.  PHYSICAL EXAMINATION: ECOG PERFORMANCE STATUS: 1 - Symptomatic but completely ambulatory  Vitals:   04/19/17 1600  BP: 103/83  Pulse: 77  Resp: 18  Temp: 97.7 F (36.5 C)   Filed Weights   04/19/17 1600  Weight: 86 lb (39 kg)    GENERAL:alert, no distress and comfortable SKIN: skin color, texture, turgor are normal, no rashes or significant lesions EYES: normal, conjunctiva are pink and non-injected, sclera clear OROPHARYNX:no exudate, no erythema and lips, buccal mucosa, and tongue normal  NECK: supple, thyroid normal size, non-tender, without nodularity LYMPH:  no palpable lymphadenopathy in the cervical, axillary or inguinal LUNGS: clear to auscultation and percussion with normal breathing effort HEART: regular rate & rhythm and no murmurs and no lower extremity edema ABDOMEN:abdomen soft, (+) epigastric tenderness, normal bowel sounds Musculoskeletal:no cyanosis of digits and no clubbing  PSYCH: alert & oriented x 3 with fluent speech NEURO: no focal motor/sensory deficits  LABORATORY DATA:  I have reviewed the data as listed Lab Results  Component Value Date   WBC 4.6 04/17/2017   HGB 12.8 04/17/2017   HCT 40.6 04/17/2017   MCV 88.8 04/17/2017   PLT 224 04/17/2017    Recent Labs  04/01/17 2253 04/06/17 2203 04/17/17 1407 04/19/17 1642  NA 136 136 136 139  K 3.6 3.8 3.9 4.0  CL 103 101 103 104  CO2 26 28 26 27   GLUCOSE 98 90 98 95  BUN 12 10 11 10   CREATININE 0.73 0.70 0.61 0.57  CALCIUM 9.3 8.7* 8.7* 8.7*  GFRNONAA >60 >60 >60 >60  GFRAA >60 >60 >60 >60  PROT 7.4 6.3* 6.4*  --   ALBUMIN 4.4 3.5 3.6  --   AST 20 14* 23  --   ALT 16 10* 20  --   ALKPHOS 35* 31* 31*  --   BILITOT 0.5 0.3 0.4  --     RADIOGRAPHIC STUDIES: I have personally reviewed the radiological images as listed and agreed  with the findings in the report. Ct Abdomen Pelvis W Contrast  Result Date: 04/02/2017 CLINICAL DATA:  Chronic right lower quadrant abdominal pain for 3 months. Initial encounter. EXAM: CT ABDOMEN AND PELVIS WITH CONTRAST TECHNIQUE: Multidetector CT imaging of the abdomen and pelvis was performed using the standard protocol following bolus administration of intravenous contrast. CONTRAST:  132mL ISOVUE-300 IOPAMIDOL (ISOVUE-300) INJECTION 61% COMPARISON:  None. FINDINGS: Lower chest: The visualized lung bases are grossly clear. The visualized portions  of the mediastinum are unremarkable. Hepatobiliary: The liver is unremarkable in appearance. The gallbladder is unremarkable in appearance. The common bile duct remains normal in caliber. Pancreas: The pancreas is within normal limits. Spleen: The spleen is not well characterized due to artifact from barium within the patient's stomach. Adrenals/Urinary Tract: The adrenal glands are unremarkable in appearance. The kidneys are within normal limits. There is no evidence of hydronephrosis. No renal or ureteral stones are identified. No perinephric stranding is seen. The left kidney is difficult to fully characterize due to artifact from barium. Stomach/Bowel: There is a moderate amount of residual barium within the stomach, from the procedure performed 2 weeks ago. This is compatible with significant gastroparesis. The stomach is otherwise partially filled with fluid and air. Residual barium is noted throughout the small and large bowel, reflecting diffuse dysmotility. There is no evidence of bowel decompression to suggest bowel obstruction at this time. The small bowel is largely distended with fluid and air. Contrast is noted within the appendix, along the right hemipelvis, best seen on coronal images. There is no evidence for appendicitis. Vascular/Lymphatic: The abdominal aorta is unremarkable in appearance. The inferior vena cava is grossly unremarkable. No  retroperitoneal lymphadenopathy is seen. No pelvic sidewall lymphadenopathy is identified. Reproductive: The bladder is decompressed and not well assessed. The uterus is grossly unremarkable in appearance. The ovaries are relatively symmetric. No suspicious adnexal masses are seen. There is prominence of the ovarian and periuterine vasculature bilaterally, worse on the left, which could reflect pelvic congestion syndrome in the appropriate clinical situation. Other: No additional soft tissue abnormalities are seen. Musculoskeletal: No acute osseous abnormalities are identified. The visualized musculature is unremarkable in appearance. IMPRESSION: 1. Moderate amount of residual barium within the stomach, from the procedure performed 2 weeks ago, compatible with significant gastroparesis. Stomach otherwise partially filled with fluid and air. 2. Residual barium throughout the small and large bowel, reflecting diffuse bowel dysmotility. No evidence for bowel obstruction. The small bowel is largely distended with fluid and air. 3. No evidence of appendicitis.  Contrast noted within the appendix. 4. Prominence of the ovarian and periuterine vasculature bilaterally, worse on the left, which could reflect pelvic congestion syndrome in the appropriate clinical situation. Electronically Signed   By: Garald Balding M.D.   On: 04/02/2017 04:05   Dg Ugi W/high Density W/kub  Result Date: 03/21/2017 CLINICAL DATA:  41 year old female with history of epigastric pain and intractable nausea and vomiting after eating. Weight loss. EXAM: UPPER GI SERIES WITH KUB TECHNIQUE: After obtaining a scout radiograph a routine upper GI series was performed using thin and high density barium. FLUOROSCOPY TIME:  Fluoroscopy Time:  5 minutes and 6 seconds Radiation Exposure Index (if provided by the fluoroscopic device): 89 mGy COMPARISON:  No priors. FINDINGS: Initial KUB demonstrates distended stomach. Bowel gas pattern is nonobstructive.  No pneumoperitoneum. Double contrast images demonstrated a normal appearance of the esophageal mucosa. Multiple single swallow attempts were observed, which demonstrated normal esophageal motility. Full column esophagram demonstrated no esophageal mass, stricture or esophageal ring. No hiatal hernia. Water siphon test demonstrated no gastroesophageal reflux. A barium tablet was administered, which passed readily into the stomach. Imaging of the stomach was exceedingly limited by a large amount of gastric contents. The overall appearance is most compatible with a bezoar. Accurate assessment of the gastric mucosa and exclusion of underlying gastric mass is not possible on today's examination. Duodenal bulb was poorly imaged. IMPRESSION: 1. Highly unusual appearance of the stomach, which appears  filled with retained food material, presumably a large gastric bezoar. This suggests a history of gastric outlet obstruction or gastroparesis. Outpatient referral to Gastroenterology for further evaluation is strongly recommended. Electronically Signed   By: Vinnie Langton M.D.   On: 03/21/2017 12:41    ASSESSMENT & PLAN: 41 y.o. [place] woman who presented with abdominal pain, nausea, and emesis for 4-5 months and significant weight loss   1. Severe calorie and protein malnutrition, secondary to gastric cancer 2. 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 Abdominal pain, nausea and vomiting 4. Anorexia and weight loss   Plan -consult Dr. Barry Dienes, possible J-tube placement tomorrow, along with a port placement for chemo -Nothing by mouth after midnight -CT chest w constrast tonight for staging  -will consult nutrition service for tube feeding  -consult GI Dr. Maryfrances Bunnell to see if he can do EGUS during this hospital stay for staging if CT no metastatic cancer  -Continue Zofran, Phenergan as needed for nausea. Continue PPI.  -ambien as needed for insomnia  -SCD for DVT  prophylaxis  -Gentle hydration with D5 normal saline  -Pt is full code.   All questions were answered.       Truitt Merle, MD 04/19/2017 6:25 PM

## 2017-04-19 NOTE — Telephone Encounter (Signed)
Faxed referral to [facility] for Dr. Barry Dienes 6/12

## 2017-04-19 NOTE — Consult Note (Signed)
[facility] Consult/Admission Note  Lexine Jaspers 13-Oct-1976  [id number].    Requesting MD: Dr. Burr Medico Chief Complaint/Reason for Consult: Gastric outlet obstruction 2/2 tumor, J tube and port-a-cath placement  HPI:   Pt is a 41 year old [place] speaking female who we were asked to consult on for placement of J-tube and Port-A-Cath. Patient has been having epigastric pain with nausea and vomiting since January. She has lost approximately 25 pounds since then. She underwent endoscopy by Dr. Paulita Fujita on 04/12/17 revealing a large, fungating, infiltrative mass in the pyloric region of the stomach extending to the pylorus distally and proximally along the lesser curve of the antrum and body. Biopsies revealed adenocarcinoma. Patient states she is not currently having any pain at this time. She also has not had any vomiting today. She states normal bowel movement 2 days ago. She is only able to drink liquids. She was having intermittent pain and pain worse with eating. Associated weight loss. Patient denies diarrhea.  Her sister was at bedside and able to interpret for me.  ROS:  Review of Systems  Constitutional: Positive for weight loss. Negative for fever.  Cardiovascular: Negative for leg swelling.  Gastrointestinal: Positive for abdominal pain, nausea and vomiting. Negative for constipation and diarrhea.  All other systems reviewed and are negative.    Family History  Problem Relation Age of Onset  . Hypertension Mother   . Heart disease Mother   . Other Neg Hx     Past Medical History:  Diagnosis Date  . Cancer (HCC)    STOMACH  . No pertinent past medical history     Past Surgical History:  Procedure Laterality Date  . NO PAST SURGERIES      Social History:  reports that she has never smoked. She has never used smokeless tobacco. She reports that she does not drink alcohol or use drugs.  Allergies: No Known Allergies  Medications Prior to Admission   Medication Sig Dispense Refill  . dicyclomine (BENTYL) 20 MG tablet Take 1 tablet (20 mg total) by mouth 2 (two) times daily. (Patient not taking: Reported on 04/18/2017) 20 tablet 0  . ibuprofen (ADVIL,MOTRIN) 600 MG tablet Take 1 tablet (600 mg total) by mouth every 6 (six) hours. (Patient not taking: Reported on 04/18/2017) 60 tablet 0  . metoCLOPramide (REGLAN) 5 MG tablet Take 2 tablets (10 mg total) by mouth 4 (four) times daily -  before meals and at bedtime. 120 tablet 1  . omeprazole (PRILOSEC) 20 MG capsule Take 1 capsule (20 mg total) by mouth 2 (two) times daily before a meal. (Patient not taking: Reported on 04/18/2017) 60 capsule 0  . ondansetron (ZOFRAN) 8 MG tablet Take 1 tablet (8 mg total) by mouth every 8 (eight) hours as needed for nausea or vomiting. (Patient not taking: Reported on 04/18/2017) 30 tablet 0  . Prenatal Vit-Fe Fumarate-FA (PRENATAL MULTIVITAMIN) TABS Take 1 tablet by mouth daily.    . promethazine (PHENERGAN) 25 MG suppository Place 1 suppository (25 mg total) rectally every 6 (six) hours as needed for nausea. (Patient not taking: Reported on 04/18/2017) 12 each 0  . zolpidem (AMBIEN) 5 MG tablet Take 1 tablet (5 mg total) by mouth at bedtime as needed for sleep. (Patient not taking: Reported on 04/18/2017) 5 tablet 0    Last menstrual period 04/01/2017.   Vitals:   04/19/17 1600  BP: 103/83  Pulse: 77  Resp: 18  Temp: 97.7 F (36.5 C)  Physical Exam  Constitutional: She is oriented to person, place, and time. Vital signs are normal. No distress.  Thin, cachectic, in no acute distress, pleasant  HENT:  Head: Normocephalic and atraumatic.  Nose: Nose normal.  Mouth/Throat: Oropharynx is clear and moist. No oropharyngeal exudate.  Eyes: Conjunctivae are normal. Pupils are equal, round, and reactive to light. Right eye exhibits no discharge. Left eye exhibits no discharge. No scleral icterus.  Neck: Normal range of motion. Neck supple. No thyromegaly  present.  Cardiovascular: Normal rate, regular rhythm, normal heart sounds and intact distal pulses.  Exam reveals no gallop and no friction rub.   No murmur heard. Pulses:      Radial pulses are 2+ on the right side, and 2+ on the left side.  Pulmonary/Chest: Effort normal and breath sounds normal. No respiratory distress. She has no decreased breath sounds. She has no wheezes. She has no rhonchi. She has no rales.  Abdominal: Soft. Normal appearance and bowel sounds are normal. She exhibits no distension and no mass. There is no hepatosplenomegaly. There is no tenderness. There is no rebound and no guarding.  Palpable hard mass noted in the right upper abdomen, nontender  Musculoskeletal: Normal range of motion. She exhibits no edema or deformity.  Lymphadenopathy:    She has no cervical adenopathy.  Neurological: She is alert and oriented to person, place, and time.  Skin: Skin is warm and dry. No rash noted. She is not diaphoretic.  Psychiatric: Mood and affect normal.  Nursing note and vitals reviewed.   No results found for this or any previous visit (from the past 48 hour(s)). No results found.    Assessment/Plan  Gastric adenocarcinoma causing outlet obstruction - Patient will go to the OR tomorrow for Port-A-Cath placement and J-tube placement - NPO at midnight  We will continue to follow this patient. Thank you for the consult.  Kalman Drape, [facility] Surgery 04/19/2017, 4:00 PM Pager: [phone] Consults: [phone] Mon-Fri 7:00 am-4:30 pm Sat-Sun 7:00 am-11:30 am

## 2017-04-19 NOTE — Telephone Encounter (Signed)
Called admitting at [facility].  Pt to come on over to admitting.  Beds available on [address].  Unable to reach pt or husband at [facility], or mobile #.  Called sister, Colletta [place] & reached niece at nail salon who informed pt to got to [facility] admitting ASAP.  Niece says they can be there in 1-1 1/2 hour.  Message sent to [place]/Managed Care.

## 2017-04-20 ENCOUNTER — Inpatient Hospital Stay (HOSPITAL_COMMUNITY): Payer: Medicaid Other

## 2017-04-20 ENCOUNTER — Other Ambulatory Visit: Payer: Self-pay | Admitting: Hematology

## 2017-04-20 ENCOUNTER — Encounter (HOSPITAL_COMMUNITY)
Admission: AD | Disposition: A | Discharge status: Home Health | Payer: Self-pay | Source: Ambulatory Visit | Attending: Hematology

## 2017-04-20 DIAGNOSIS — C786 Secondary malignant neoplasm of retroperitoneum and peritoneum: Secondary | ICD-10-CM

## 2017-04-20 DIAGNOSIS — E43 Unspecified severe protein-calorie malnutrition: Secondary | ICD-10-CM | POA: Insufficient documentation

## 2017-04-20 HISTORY — PX: GASTROSTOMY: SHX5249

## 2017-04-20 HISTORY — PX: PORTACATH PLACEMENT: SHX2246

## 2017-04-20 LAB — GLUCOSE, CAPILLARY
Glucose-Capillary: 91 mg/dL (ref 65–99)
Glucose-Capillary: 121 mg/dL — ABNORMAL HIGH (ref 65–99)

## 2017-04-20 LAB — SURGICAL PCR SCREEN
MRSA, PCR: NEGATIVE
STAPHYLOCOCCUS AUREUS: NEGATIVE

## 2017-04-20 LAB — HIV ANTIBODY (ROUTINE TESTING W REFLEX): HIV SCREEN 4TH GENERATION: NONREACTIVE

## 2017-04-20 SURGERY — INSERTION, TUNNELED CENTRAL VENOUS DEVICE, WITH PORT
Anesthesia: General

## 2017-04-20 MED ORDER — HEPARIN SOD (PORK) LOCK FLUSH 100 UNIT/ML IV SOLN
INTRAVENOUS | Status: AC
  Filled 2017-04-20: qty 5

## 2017-04-20 MED ORDER — MIDAZOLAM HCL 2 MG/2ML IJ SOLN
INTRAMUSCULAR | Status: AC
  Filled 2017-04-20: qty 2

## 2017-04-20 MED ORDER — FENTANYL CITRATE (PF) 100 MCG/2ML IJ SOLN: 25.0000 ug | INTRAMUSCULAR | Status: DC | PRN

## 2017-04-20 MED ORDER — ONDANSETRON HCL 4 MG/2ML IJ SOLN
INTRAMUSCULAR | Status: AC
  Filled 2017-04-20: qty 2

## 2017-04-20 MED ORDER — PHENYLEPHRINE 40 MCG/ML (10ML) SYRINGE FOR IV PUSH (FOR BLOOD PRESSURE SUPPORT)
PREFILLED_SYRINGE | INTRAVENOUS | Status: AC
  Filled 2017-04-20: qty 10

## 2017-04-20 MED ORDER — ROCURONIUM BROMIDE 100 MG/10ML IV SOLN
INTRAVENOUS | Status: DC | PRN
  Administered 2017-04-20: 10 mg via INTRAVENOUS
  Administered 2017-04-20: 30 mg via INTRAVENOUS

## 2017-04-20 MED ORDER — SUGAMMADEX SODIUM 200 MG/2ML IV SOLN
INTRAVENOUS | Status: AC
  Filled 2017-04-20: qty 2

## 2017-04-20 MED ORDER — DEXAMETHASONE SODIUM PHOSPHATE 10 MG/ML IJ SOLN
INTRAMUSCULAR | Status: DC | PRN
  Administered 2017-04-20: 10 mg via INTRAVENOUS

## 2017-04-20 MED ORDER — OXYCODONE HCL 5 MG PO TABS: 5.0000 mg | ORAL_TABLET | Freq: Once | ORAL | Status: DC | PRN

## 2017-04-20 MED ORDER — EPHEDRINE SULFATE 50 MG/ML IJ SOLN
INTRAMUSCULAR | Status: DC | PRN
  Administered 2017-04-20: 10 mg via INTRAVENOUS
  Administered 2017-04-20: 5 mg via INTRAVENOUS

## 2017-04-20 MED ORDER — ONDANSETRON HCL 4 MG/2ML IJ SOLN
INTRAMUSCULAR | Status: DC | PRN
  Administered 2017-04-20: 4 mg via INTRAVENOUS

## 2017-04-20 MED ORDER — FENTANYL CITRATE (PF) 100 MCG/2ML IJ SOLN
INTRAMUSCULAR | Status: DC | PRN
  Administered 2017-04-20 (×4): 25 ug via INTRAVENOUS
  Administered 2017-04-20 (×2): 50 ug via INTRAVENOUS

## 2017-04-20 MED ORDER — CEFAZOLIN SODIUM-DEXTROSE 2-3 GM-% IV SOLR
INTRAVENOUS | Status: DC | PRN
  Administered 2017-04-20: 2 g via INTRAVENOUS

## 2017-04-20 MED ORDER — JEVITY 1.2 CAL PO LIQD: 1000.0000 mL | ORAL | Status: DC

## 2017-04-20 MED ORDER — LIDOCAINE 2% (20 MG/ML) 5 ML SYRINGE
INTRAMUSCULAR | Status: AC
  Filled 2017-04-20: qty 5

## 2017-04-20 MED ORDER — DIPHENHYDRAMINE HCL 50 MG/ML IJ SOLN: 12.5000 mg | Freq: Four times a day (QID) | INTRAMUSCULAR | Status: DC | PRN

## 2017-04-20 MED ORDER — SODIUM CHLORIDE 0.9% FLUSH: 9.0000 mL | INTRAVENOUS | Status: DC | PRN

## 2017-04-20 MED ORDER — HYDROMORPHONE 1 MG/ML IV SOLN
INTRAVENOUS | Status: DC
  Administered 2017-04-20: 16:00:00 via INTRAVENOUS
  Administered 2017-04-20: 1.7 mg via INTRAVENOUS
  Administered 2017-04-21: 0.6 mg via INTRAVENOUS
  Administered 2017-04-21 (×2): 0 mg via INTRAVENOUS
  Administered 2017-04-21 (×2): 0.9 mg via INTRAVENOUS
  Administered 2017-04-21: 1.2 mg via INTRAVENOUS
  Administered 2017-04-21: 0.6 mg via INTRAVENOUS
  Administered 2017-04-22: 0 mg via INTRAVENOUS
  Administered 2017-04-22: 0.6 mg via INTRAVENOUS
  Filled 2017-04-20 (×3): qty 25

## 2017-04-20 MED ORDER — OSMOLITE 1.2 CAL PO LIQD
1000.0000 mL | ORAL | Status: DC
  Administered 2017-04-21 – 2017-04-23 (×2): 1000 mL
  Filled 2017-04-20 (×5): qty 1000

## 2017-04-20 MED ORDER — SODIUM CHLORIDE 0.9 % IV SOLN
INTRAVENOUS | Status: DC | PRN
  Administered 2017-04-20: 13:00:00

## 2017-04-20 MED ORDER — PROPOFOL 10 MG/ML IV BOLUS
INTRAVENOUS | Status: AC
  Filled 2017-04-20: qty 20

## 2017-04-20 MED ORDER — MIDAZOLAM HCL 5 MG/5ML IJ SOLN
INTRAMUSCULAR | Status: DC | PRN
  Administered 2017-04-20: 1 mg via INTRAVENOUS

## 2017-04-20 MED ORDER — HEPARIN SOD (PORK) LOCK FLUSH 100 UNIT/ML IV SOLN
INTRAVENOUS | Status: DC | PRN
  Administered 2017-04-20: 500 [IU] via INTRAVENOUS

## 2017-04-20 MED ORDER — LIDOCAINE HCL 1 % IJ SOLN
INTRAMUSCULAR | Status: DC | PRN
  Administered 2017-04-20: 13:00:00

## 2017-04-20 MED ORDER — LIDOCAINE HCL (CARDIAC) 20 MG/ML IV SOLN
INTRAVENOUS | Status: DC | PRN
  Administered 2017-04-20: 50 mg via INTRAVENOUS

## 2017-04-20 MED ORDER — OSMOLITE 1.2 CAL PO LIQD: 1000.0000 mL | ORAL | Status: DC

## 2017-04-20 MED ORDER — LIDOCAINE HCL (PF) 1 % IJ SOLN
INTRAMUSCULAR | Status: AC
  Filled 2017-04-20: qty 30

## 2017-04-20 MED ORDER — EPHEDRINE 5 MG/ML INJ
INTRAVENOUS | Status: AC
  Filled 2017-04-20: qty 10

## 2017-04-20 MED ORDER — PROPOFOL 10 MG/ML IV BOLUS
INTRAVENOUS | Status: DC | PRN
  Administered 2017-04-20: 100 mg via INTRAVENOUS

## 2017-04-20 MED ORDER — CEFAZOLIN SODIUM-DEXTROSE 2-4 GM/100ML-% IV SOLN
INTRAVENOUS | Status: AC
  Filled 2017-04-20: qty 100

## 2017-04-20 MED ORDER — LACTATED RINGERS IV SOLN
INTRAVENOUS | Status: DC
  Administered 2017-04-20 (×2): via INTRAVENOUS

## 2017-04-20 MED ORDER — ONDANSETRON HCL 4 MG/2ML IJ SOLN: 4.0000 mg | Freq: Four times a day (QID) | INTRAMUSCULAR | Status: DC | PRN

## 2017-04-20 MED ORDER — SUGAMMADEX SODIUM 200 MG/2ML IV SOLN
INTRAVENOUS | Status: DC | PRN
  Administered 2017-04-20 (×2): 80 mg via INTRAVENOUS

## 2017-04-20 MED ORDER — NALOXONE HCL 0.4 MG/ML IJ SOLN: 0.4000 mg | INTRAMUSCULAR | Status: DC | PRN

## 2017-04-20 MED ORDER — DEXAMETHASONE SODIUM PHOSPHATE 10 MG/ML IJ SOLN
INTRAMUSCULAR | Status: AC
  Filled 2017-04-20: qty 1

## 2017-04-20 MED ORDER — PHENOL 1.4 % MT LIQD: 2.0000 | OROMUCOSAL | Status: DC | PRN

## 2017-04-20 MED ORDER — DIPHENHYDRAMINE HCL 12.5 MG/5ML PO ELIX: 12.5000 mg | ORAL_SOLUTION | Freq: Four times a day (QID) | ORAL | Status: DC | PRN

## 2017-04-20 MED ORDER — 0.9 % SODIUM CHLORIDE (POUR BTL) OPTIME
TOPICAL | Status: DC | PRN
  Administered 2017-04-20: 1000 mL

## 2017-04-20 MED ORDER — PHENYLEPHRINE HCL 10 MG/ML IJ SOLN
INTRAMUSCULAR | Status: DC | PRN
  Administered 2017-04-20: 40 ug via INTRAVENOUS

## 2017-04-20 MED ORDER — FENTANYL CITRATE (PF) 250 MCG/5ML IJ SOLN
INTRAMUSCULAR | Status: AC
  Filled 2017-04-20: qty 5

## 2017-04-20 MED ORDER — BUPIVACAINE-EPINEPHRINE (PF) 0.25% -1:200000 IJ SOLN
INTRAMUSCULAR | Status: AC
  Filled 2017-04-20: qty 30

## 2017-04-20 MED ORDER — OXYCODONE HCL 5 MG/5ML PO SOLN: 5.0000 mg | Freq: Once | ORAL | Status: DC | PRN

## 2017-04-20 SURGICAL SUPPLY — 76 items
BAG DECANTER FOR FLEXI CONT (MISCELLANEOUS) ×3 IMPLANT
BENZOIN TINCTURE PRP APPL 2/3 (GAUZE/BANDAGES/DRESSINGS) ×3 IMPLANT
BLADE CLIPPER SURG (BLADE) IMPLANT
BLADE SURG 11 STRL SS (BLADE) ×3 IMPLANT
BLADE SURG 15 STRL LF DISP TIS (BLADE) ×2 IMPLANT
BLADE SURG 15 STRL SS (BLADE) ×1
CANISTER SUCT 3000ML PPV (MISCELLANEOUS) ×6 IMPLANT
CHLORAPREP W/TINT 10.5 ML (MISCELLANEOUS) ×3 IMPLANT
CHLORAPREP W/TINT 26ML (MISCELLANEOUS) ×3 IMPLANT
CLIP TI LARGE 6 (CLIP) IMPLANT
CLIP TI MEDIUM 24 (CLIP) IMPLANT
CONT SPEC 4OZ CLIKSEAL STRL BL (MISCELLANEOUS) ×3 IMPLANT
COVER SURGICAL LIGHT HANDLE (MISCELLANEOUS) ×3 IMPLANT
COVER TRANSDUCER ULTRASND GEL (DRAPE) IMPLANT
CRADLE DONUT ADULT HEAD (MISCELLANEOUS) ×3 IMPLANT
DECANTER SPIKE VIAL GLASS SM (MISCELLANEOUS) ×6 IMPLANT
DERMABOND ADVANCED (GAUZE/BANDAGES/DRESSINGS) ×1
DERMABOND ADVANCED .7 DNX12 (GAUZE/BANDAGES/DRESSINGS) ×2 IMPLANT
DRAIN PENROSE 1/2X36 STERILE (WOUND CARE) IMPLANT
DRAPE C-ARM 42X72 X-RAY (DRAPES) ×3 IMPLANT
DRAPE CHEST BREAST 15X10 FENES (DRAPES) ×3 IMPLANT
DRAPE LAPAROSCOPIC ABDOMINAL (DRAPES) ×3 IMPLANT
DRAPE UTILITY XL STRL (DRAPES) ×6 IMPLANT
DRAPE WARM FLUID 44X44 (DRAPE) ×3 IMPLANT
DRSG COVADERM 4X6 (GAUZE/BANDAGES/DRESSINGS) ×3 IMPLANT
ELECT BLADE 6.5 EXT (BLADE) IMPLANT
ELECT COATED BLADE 2.86 ST (ELECTRODE) ×3 IMPLANT
ELECT REM PT RETURN 9FT ADLT (ELECTROSURGICAL) ×3
ELECTRODE REM PT RTRN 9FT ADLT (ELECTROSURGICAL) ×2 IMPLANT
GAUZE SPONGE 4X4 12PLY STRL (GAUZE/BANDAGES/DRESSINGS) ×3 IMPLANT
GAUZE SPONGE 4X4 16PLY XRAY LF (GAUZE/BANDAGES/DRESSINGS) ×3 IMPLANT
GEL ULTRASOUND 20GR AQUASONIC (MISCELLANEOUS) IMPLANT
GLOVE BIO SURGEON STRL SZ 6 (GLOVE) ×3 IMPLANT
GLOVE BIOGEL PI IND STRL 6.5 (GLOVE) ×2 IMPLANT
GLOVE BIOGEL PI IND STRL 7.0 (GLOVE) ×2 IMPLANT
GLOVE BIOGEL PI INDICATOR 6.5 (GLOVE) ×1
GLOVE BIOGEL PI INDICATOR 7.0 (GLOVE) ×1
GLOVE SURG SS PI 6.5 STRL IVOR (GLOVE) ×3 IMPLANT
GOWN STRL REUS W/ TWL LRG LVL3 (GOWN DISPOSABLE) ×6 IMPLANT
GOWN STRL REUS W/TWL 2XL LVL3 (GOWN DISPOSABLE) ×6 IMPLANT
GOWN STRL REUS W/TWL LRG LVL3 (GOWN DISPOSABLE) ×3
KIT BASIN OR (CUSTOM PROCEDURE TRAY) ×3 IMPLANT
KIT PORT POWER 8FR ISP CVUE (Catheter) ×3 IMPLANT
KIT ROOM TURNOVER OR (KITS) ×3 IMPLANT
NEEDLE HYPO 25GX1X1/2 BEV (NEEDLE) ×3 IMPLANT
NS IRRIG 1000ML POUR BTL (IV SOLUTION) ×3 IMPLANT
PACK GENERAL/GYN (CUSTOM PROCEDURE TRAY) ×3 IMPLANT
PACK SURGICAL SETUP 50X90 (CUSTOM PROCEDURE TRAY) ×3 IMPLANT
PAD ARMBOARD 7.5X6 YLW CONV (MISCELLANEOUS) ×6 IMPLANT
PENCIL BUTTON HOLSTER BLD 10FT (ELECTRODE) ×3 IMPLANT
SHEARS FOC LG CVD HARMONIC 17C (MISCELLANEOUS) IMPLANT
SPECIMEN JAR X LARGE (MISCELLANEOUS) ×3 IMPLANT
SPONGE LAP 18X18 X RAY DECT (DISPOSABLE) IMPLANT
STAPLER VISISTAT 35W (STAPLE) ×3 IMPLANT
STRIP CLOSURE SKIN 1/2X4 (GAUZE/BANDAGES/DRESSINGS) ×3 IMPLANT
SUT ETHILON 2 0 FS 18 (SUTURE) ×9 IMPLANT
SUT MNCRL AB 4-0 PS2 18 (SUTURE) ×3 IMPLANT
SUT MON AB 4-0 PC3 18 (SUTURE) ×3 IMPLANT
SUT PDS II 0 TP-1 LOOPED 60 (SUTURE) ×6 IMPLANT
SUT PROLENE 2 0 SH DA (SUTURE) ×6 IMPLANT
SUT SILK 2 0 SH CR/8 (SUTURE) ×3 IMPLANT
SUT SILK 2 0 TIES 10X30 (SUTURE) ×3 IMPLANT
SUT SILK 3 0 SH CR/8 (SUTURE) IMPLANT
SUT SILK 3 0 TIES 10X30 (SUTURE) IMPLANT
SUT VIC AB 3-0 SH 27 (SUTURE) ×3
SUT VIC AB 3-0 SH 27X BRD (SUTURE) ×6 IMPLANT
SYR 20ML ECCENTRIC (SYRINGE) ×6 IMPLANT
SYR 5ML LUER SLIP (SYRINGE) ×3 IMPLANT
SYR CONTROL 10ML LL (SYRINGE) ×3 IMPLANT
TOWEL OR 17X24 6PK STRL BLUE (TOWEL DISPOSABLE) ×3 IMPLANT
TOWEL OR 17X26 10 PK STRL BLUE (TOWEL DISPOSABLE) ×3 IMPLANT
TRAY FOLEY W/METER SILVER 14FR (SET/KITS/TRAYS/PACK) ×3 IMPLANT
TUBE CONNECTING 12X1/4 (SUCTIONS) IMPLANT
TUBE J 18FR (TUBING) ×3 IMPLANT
WATER STERILE IRR 1000ML POUR (IV SOLUTION) ×3 IMPLANT
YANKAUER SUCT BULB TIP NO VENT (SUCTIONS) ×3 IMPLANT

## 2017-04-20 NOTE — Op Note (Signed)
PREOPERATIVE DIAGNOSIS:  Gastric cancer, protein calorie malnutrition.     POSTOPERATIVE DIAGNOSIS:  Same     PROCEDURE: Left subclavian port placement, Bard ClearVue Power Port, MRI safe, 8-French, 18 Fr Jejunostomy tube placement     SURGEON:  Stark Klein, MD     Assistant:  Jackson Latino, PA-C   ANESTHESIA:  General   FINDINGS:  Good venous return, easy flush, and tip of the catheter and   SVC 21 cm.   Many omental nodules, several peritoneal nodules, and large pyloric mass appearing clinically to invade the pancreas.       SPECIMEN:  Frozen section sent on omental nodules.    ESTIMATED BLOOD LOSS:  <99 mL     COMPLICATIONS:  None known.      PROCEDURE:  Pt was identified in the holding area and taken to   the operating room, where patient was placed supine on the operating room   table.  General anesthesia was induced.  Patient's arms were tucked and the upper   chest and neck were prepped and draped in sterile fashion.  Time-out was   performed according to the surgical safety check list.  When all was   correct, we continued.   Local anesthetic was administered over this   area at the angle of the clavicle.  The vein was accessed with 1 pass of the needle. The wire would not thread all the way.  A second stick was performed and then there was good venous return and the wire passed easily with no ectopy.   Fluoroscopy was used to confirm that the wire was in the vena cava.      The patient was placed back level and the area for the pocket was anethetized   with local anesthetic.  A 3-cm transverse incision was made with a #15   blade.  Cautery was used to divide the subcutaneous tissues down to the   pectoralis muscle.  An [employer] retractor was used to elevate the skin   while a pocket was created on top of the pectoralis fascia.  The port   was placed into the pocket to confirm that it was of adequate size.  The   catheter was preattached to the port.  The port was then  secured to the   pectoralis fascia with four 2-0 Prolene sutures.  These were clamped and   not tied down yet.    The catheter was tunneled through to the wire exit   site.  The catheter was placed along the wire to determine what length it should be to be in the SVC.  The catheter was cut at 21 cm.  The tunneler sheath and dilator were passed over the wire and the dilator and wire were removed.  The catheter was advanced through the tunneler sheath and the tunneler sheath was pulled away.  Care was taken to keep the catheter in the tunneler sheath as this occurred. This was advanced and the tunneler sheath was removed.  There was good venous   return and easy flush of the catheter.  The Prolene sutures were tied   down to the pectoral fascia.  The skin was reapproximated using 3-0   Vicryl interrupted deep dermal sutures.    Fluoroscopy was used to re-confirm good position of the catheter.  The skin   was then closed using 4-0 Monocryl in a subcuticular fashion.  The port was flushed with concentrated heparin flush as well.  The wounds were  then cleaned, dried, and dressed with Dermabond.  The patient was awakened from anesthesia and taken to the PACU in stable condition.  Needle, sponge, and instrument counts were correct.   A second timeout was performed. A small midline incision was made just above the umbilicus over the palpable tumor.  The subcutaneous tissues were divided with cautery.  The fascia was entered in the midline.  The mass was seen in the pylorus and it was quite large.  It was fixed posteriorly.  The abdomen was explored.  There were multiple small sand like implants along the lower anterior abdominal wall.  There was one around 6 mm in the right abdomen.  None were palpated on the liver.  There were many nodules in the omentum.  A section of omentum was taken and sent for frozen section.  This was done with clamp/cut/tie technique.    The stomach and transverse colon were tucked  into the upper abdomen.  The ligament of trietz was located and a location around 25 cm distal was identified.  A pursestring suture was placed on the antimesenteric side.  An appropriate site for a J tube was selected in the left mid abdomen.  The bowel was opened and the J tube was threaded into the efferent limb.  The J tube balloon was blown up 5 mL, taking care to ensure that there was adequate lumen around the balloon. Four to five 3-0 silk Witzel sutures were placed around the J tube.  The tube was pulled up to the abdominal wall.  This was pexed to the abdominal wall with 2-0 silk sutures. The flange around the J tube was secured with interrupted 2-0 nylon sutures.  The frozen returned as positive for metastatic disease.  The fascia was then closed with running 0-0 looped PDS suture.  The skin was irrigated and closed with 3-0 vicryl interrupted deep dermal sutures and 4-0 monocryl running subcuticular suture.  The skin was then cleaned, dried, and dressed with Benzoin, steristrips, and an island dressing.  Needle, sponge, and instrument counts were correct x 2.  The patient was taken to the PACU in stable condition.                Stark Klein, MD

## 2017-04-20 NOTE — Progress Notes (Addendum)
Initial Nutrition Assessment  DOCUMENTATION CODES:   Severe malnutrition in context of chronic illness, Underweight  INTERVENTION:   -Initiate Osmolite 1.2 @ 25 ml/hr via j-tube and increase by 10 ml every 12 hours to goal rate of 55 ml/hr.   Tube feeding regimen provides 1584 kcal (100% of needs), 73 grams of protein, and 1082 ml of H2O.   If no IVFS, recommend 125 ml free water flush every 6 hours.   -Recommend monitor K, Mg, and Phos daily x 3 days and replete as necessary due to high feeding risk  NUTRITION DIAGNOSIS:   Malnutrition (Severe) related to chronic illness (gastric cancer) as evidenced by energy intake < 75% for > or equal to 1 month, severe depletion of body fat, severe depletion of muscle mass, percent weight loss.  GOAL:   Patient will meet greater than or equal to 90% of their needs  MONITOR:   Labs, Weight trends, TF tolerance, Skin, I & O's  REASON FOR ASSESSMENT:   Consult Enteral/tube feeding initiation and management  ASSESSMENT:   Pt is a 41 year old [place] speaking female admitted for placement of J-tube and Port-A-Cath. Patient has been having epigastric pain with nausea and vomiting since January. She has lost approximately 25 pounds since then. She underwent endoscopy by Dr. Paulita Fujita on 04/12/17 revealing a large, fungating, infiltrative mass in the pyloric region of the stomach extending to the pylorus distally and proximally along the lesser curve of the antrum and body. Biopsies revealed adenocarcinoma.  Pt admitted with gastric outlet obstruction due to gastric cancer.   S/p PROCEDURE on 04/20/17: Left subclavian port placement, Bard ClearVue Power Port, MRI safe, 8-French, 18 Fr Jejunostomy tube placement  Noted NGT in place.    Case discussed with RN, who reports pt is currently very lethargic. Confirmed plan is to start TF tomorrow.   Hx obtained from pt's husband at bedside. He reports that pt was in her usual state of health, until  about 3 months ago, when she started vomiting all oral intake. He states pt has always had a good appetite, however, was unable to take down any food.   Pt husband shared that pt has always been a petite woman and UBW was around 100#. Weight loss has been progressively ongoing for the past 3-4 months. Reviewed records from [place]. Noted pt was 104# on 01/30/17. Pt has experienced a 16# (15.3%) wt loss over the past 3 months, which is significant for time frame.   Nutrition-Focused physical exam completed. Findings are moderate to severe fat depletion, moderate to severe muscle depletion, and no edema. 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 Pt husband also shared with this RD a picture of pt from last year, which revealed significant change in appearance in regard to fat and muscle wasting. Pt husband also noted that pt's hair has been falling out, which may be indicative of protein deficiency. RD did not confirm this finding during exam and unable to examine nails secondary to polish.    Discussed with pt husband plan for TF and how pt would receive nutrition.   Labs reviewed.   Diet Order:  Diet NPO time specified  Skin:  Reviewed, no issues  Last BM:  04/17/17  Height:   Ht Readings from Last 1 Encounters:  04/19/17 5\' 4"  (1.626 m)    Weight:   Wt Readings from Last 1  Encounters:  04/20/17 88 lb 6.5 oz (40.1 kg)    Ideal Body Weight:  54.5 kg  BMI:  Body mass index is 15.17 kg/m.  Estimated Nutritional Needs:   Kcal:  1400-1600  Protein:  65-80 grams  Fluid:  1.4-1.6 L  EDUCATION NEEDS:   Education needs addressed  Ariq Khamis A. Jimmye Norman, RD, LDN, CDE Pager: [phone] After hours Pager: [phone]

## 2017-04-20 NOTE — Anesthesia Preprocedure Evaluation (Signed)
Anesthesia Evaluation  Patient identified by MRN, date of birth, ID band Patient awake    Reviewed: Allergy & Precautions, H&P , NPO status , Patient's Chart, lab work & pertinent test results  Airway Mallampati: I  TM Distance: >3 FB Neck ROM: full    Dental no notable dental hx.    Pulmonary neg pulmonary ROS,    breath sounds clear to auscultation       Cardiovascular negative cardio ROS   Rhythm:regular Rate:Normal     Neuro/Psych negative neurological ROS  negative psych ROS   GI/Hepatic Neg liver ROS, Gastric cancer   Endo/Other  negative endocrine ROS  Renal/GU negative Renal ROS     Musculoskeletal   Abdominal   Peds  Hematology negative hematology ROS (+)   Anesthesia Other Findings   Reproductive/Obstetrics                             Anesthesia Physical Anesthesia Plan  ASA: II  Anesthesia Plan: General   Post-op Pain Management:    Induction: Intravenous  PONV Risk Score and Plan: 3 and Ondansetron, Dexamethasone, Propofol, Midazolam and Treatment may vary due to age or medical condition  Airway Management Planned: Oral ETT  Additional Equipment:   Intra-op Plan:   Post-operative Plan: Extubation in OR  Informed Consent: I have reviewed the patients History and Physical, chart, labs and discussed the procedure including the risks, benefits and alternatives for the proposed anesthesia with the patient or authorized representative who has indicated his/her understanding and acceptance.   Dental advisory given  Plan Discussed with: CRNA and Surgeon  Anesthesia Plan Comments:         Anesthesia Quick Evaluation

## 2017-04-20 NOTE — Anesthesia Procedure Notes (Signed)
Procedure Name: Intubation Performed by: Mariea Clonts Pre-anesthesia Checklist: Patient identified, Emergency Drugs available, Suction available, Patient being monitored and Timeout performed Patient Re-evaluated:Patient Re-evaluated prior to inductionOxygen Delivery Method: Circle system utilized Preoxygenation: Pre-oxygenation with 100% oxygen Intubation Type: IV induction Ventilation: Mask ventilation without difficulty Laryngoscope Size: Mac and 3 Tube type: Oral Tube size: 7.0 mm Number of attempts: 1 Airway Equipment and Method: Patient positioned with wedge pillow and Stylet

## 2017-04-20 NOTE — H&P (View-Only) (Signed)
[facility] Consult/Admission Note  Claire Bray [birth year]  [id number].    Requesting MD: Dr. Burr Medico Chief Complaint/Reason for Consult: Gastric outlet obstruction 2/2 tumor, J tube and port-a-cath placement  HPI:   Pt is a 41 year old [place] speaking female who we were asked to consult on for placement of J-tube and Port-A-Cath. Patient has been having epigastric pain with nausea and vomiting since January. She has lost approximately 25 pounds since then. She underwent endoscopy by Dr. Paulita Fujita on 04/12/17 revealing a large, fungating, infiltrative mass in the pyloric region of the stomach extending to the pylorus distally and proximally along the lesser curve of the antrum and body. Biopsies revealed adenocarcinoma. Patient states she is not currently having any pain at this time. She also has not had any vomiting today. She states normal bowel movement 2 days ago. She is only able to drink liquids. She was having intermittent pain and pain worse with eating. Associated weight loss. Patient denies diarrhea.  Her sister was at bedside and able to interpret for me.  ROS:  Review of Systems  Constitutional: Positive for weight loss. Negative for fever.  Cardiovascular: Negative for leg swelling.  Gastrointestinal: Positive for abdominal pain, nausea and vomiting. Negative for constipation and diarrhea.  All other systems reviewed and are negative.    Family History  Problem Relation Age of Onset  . Hypertension Mother   . Heart disease Mother   . Other Neg Hx     Past Medical History:  Diagnosis Date  . Cancer (HCC)    STOMACH  . No pertinent past medical history     Past Surgical History:  Procedure Laterality Date  . NO PAST SURGERIES      Social History:  reports that she has never smoked. She has never used smokeless tobacco. She reports that she does not drink alcohol or use drugs.  Allergies: No Known Allergies  Medications Prior to Admission   Medication Sig Dispense Refill  . dicyclomine (BENTYL) 20 MG tablet Take 1 tablet (20 mg total) by mouth 2 (two) times daily. (Patient not taking: Reported on 04/18/2017) 20 tablet 0  . ibuprofen (ADVIL,MOTRIN) 600 MG tablet Take 1 tablet (600 mg total) by mouth every 6 (six) hours. (Patient not taking: Reported on 04/18/2017) 60 tablet 0  . metoCLOPramide (REGLAN) 5 MG tablet Take 2 tablets (10 mg total) by mouth 4 (four) times daily -  before meals and at bedtime. 120 tablet 1  . omeprazole (PRILOSEC) 20 MG capsule Take 1 capsule (20 mg total) by mouth 2 (two) times daily before a meal. (Patient not taking: Reported on 04/18/2017) 60 capsule 0  . ondansetron (ZOFRAN) 8 MG tablet Take 1 tablet (8 mg total) by mouth every 8 (eight) hours as needed for nausea or vomiting. (Patient not taking: Reported on 04/18/2017) 30 tablet 0  . Prenatal Vit-Fe Fumarate-FA (PRENATAL MULTIVITAMIN) TABS Take 1 tablet by mouth daily.    . promethazine (PHENERGAN) 25 MG suppository Place 1 suppository (25 mg total) rectally every 6 (six) hours as needed for nausea. (Patient not taking: Reported on 04/18/2017) 12 each 0  . zolpidem (AMBIEN) 5 MG tablet Take 1 tablet (5 mg total) by mouth at bedtime as needed for sleep. (Patient not taking: Reported on 04/18/2017) 5 tablet 0    Last menstrual period 04/01/2017.   Vitals:   04/19/17 1600  BP: 103/83  Pulse: 77  Resp: 18  Temp: 97.7 F (36.5 C)  Physical Exam  Constitutional: She is oriented to person, place, and time. Vital signs are normal. No distress.  Thin, cachectic, in no acute distress, pleasant  HENT:  Head: Normocephalic and atraumatic.  Nose: Nose normal.  Mouth/Throat: Oropharynx is clear and moist. No oropharyngeal exudate.  Eyes: Conjunctivae are normal. Pupils are equal, round, and reactive to light. Right eye exhibits no discharge. Left eye exhibits no discharge. No scleral icterus.  Neck: Normal range of motion. Neck supple. No thyromegaly  present.  Cardiovascular: Normal rate, regular rhythm, normal heart sounds and intact distal pulses.  Exam reveals no gallop and no friction rub.   No murmur heard. Pulses:      Radial pulses are 2+ on the right side, and 2+ on the left side.  Pulmonary/Chest: Effort normal and breath sounds normal. No respiratory distress. She has no decreased breath sounds. She has no wheezes. She has no rhonchi. She has no rales.  Abdominal: Soft. Normal appearance and bowel sounds are normal. She exhibits no distension and no mass. There is no hepatosplenomegaly. There is no tenderness. There is no rebound and no guarding.  Palpable hard mass noted in the right upper abdomen, nontender  Musculoskeletal: Normal range of motion. She exhibits no edema or deformity.  Lymphadenopathy:    She has no cervical adenopathy.  Neurological: She is alert and oriented to person, place, and time.  Skin: Skin is warm and dry. No rash noted. She is not diaphoretic.  Psychiatric: Mood and affect normal.  Nursing note and vitals reviewed.   No results found for this or any previous visit (from the past 48 hour(s)). No results found.    Assessment/Plan  Gastric adenocarcinoma causing outlet obstruction - Patient will go to the OR tomorrow for Port-A-Cath placement and J-tube placement - NPO at midnight  We will continue to follow this patient. Thank you for the consult.  Kalman Drape, [facility] 04/19/2017, 4:00 PM Pager: [phone] Consults: [phone] Mon-Fri 7:00 am-4:30 pm Sat-Sun 7:00 am-11:30 am

## 2017-04-20 NOTE — Transfer of Care (Signed)
Immediate Anesthesia Transfer of Care Note  Patient: Claire Bray  Procedure(s) Performed: Procedure(s): INSERTION PORT-A-CATH (Left) INSERTION OF J TUBE (N/A)  Patient Location: PACU  Anesthesia Type:General  Level of Consciousness: awake, alert  and oriented  Airway & Oxygen Therapy: Patient Spontanous Breathing and Patient connected to nasal cannula oxygen  Post-op Assessment: Report given to RN and Post -op Vital signs reviewed and stable  Post vital signs: Reviewed and stable  Last Vitals:  Vitals:   04/19/17 2123 04/20/17 0540  BP: 121/70 99/73  Pulse: 67 73  Resp: 18 17  Temp: 37.1 C 36.9 C    Last Pain:  Vitals:   04/20/17 0842  TempSrc:   PainSc: 0-No pain         Complications: No apparent anesthesia complications

## 2017-04-20 NOTE — Progress Notes (Signed)
Claire Bray   DOB:December 17, 1975   LJ#:[id number]   HQR#:[id number]  ONCOLOGY FOLLOW UP NOTE   Subjective: Pt underwent J-tube and port placement by Dr. Barry Dienes today. She is on PCA for the pain control. No other new complains.   Objective:  Vitals:   04/20/17 1459 04/20/17 1533  BP: (!) 131/95 131/90  Pulse: 70 (!) 51  Resp: 12 13  Temp: 97.9 F (36.6 C) 99.2 F (37.3 C)    Body mass index is 15.17 kg/m.  Intake/Output Summary (Last 24 hours) at 04/20/17 1934 Last data filed at 04/20/17 1800  Gross per 24 hour  Intake          2482.66 ml  Output              725 ml  Net          1757.66 ml     Sclerae unicteric  Oropharynx clear, she has NG tube in   No peripheral adenopathy  Lungs clear -- no rales or rhonchi  Heart regular rate and rhythm  Abdomen , (+) J-tube   MSK no focal spinal tenderness, no peripheral edema  Neuro nonfocal    CBG (last 3)   Recent Labs  04/20/17 0755  GLUCAP 91     Labs:  Lab Results  Component Value Date   WBC 4.6 04/17/2017   HGB 12.8 04/17/2017   HCT 40.6 04/17/2017   MCV 88.8 04/17/2017   PLT 224 04/17/2017   CMP Latest Ref Rng & Units 04/19/2017 04/17/2017 04/06/2017  Glucose 65 - 99 mg/dL 95 98 90  BUN 6 - 20 mg/dL 10 11 10   Creatinine 0.44 - 1.00 mg/dL 0.57 0.61 0.70  Sodium 135 - 145 mmol/L 139 136 136  Potassium 3.5 - 5.1 mmol/L 4.0 3.9 3.8  Chloride 101 - 111 mmol/L 104 103 101  CO2 22 - 32 mmol/L 27 26 28   Calcium 8.9 - 10.3 mg/dL 8.7(L) 8.7(L) 8.7(L)  Total Protein 6.5 - 8.1 g/dL - 6.4(L) 6.3(L)  Total Bilirubin 0.3 - 1.2 mg/dL - 0.4 0.3  Alkaline Phos 38 - 126 U/L - 31(L) 31(L)  AST 15 - 41 U/L - 23 14(L)  ALT 14 - 54 U/L - 20 10(L)     Urine Studies No results for input(s): UHGB, CRYS in the last 72 hours.  Invalid input(s): UACOL, UAPR, USPG, UPH, UTP, UGL, UKET, UBIL, UNIT, UROB, ULEU, UEPI, UWBC, URBC, UBAC, CAST, UCOM, BILUA  Basic Metabolic Panel:  Recent Labs Lab 04/17/17 1407 04/19/17 1642  NA  136 139  K 3.9 4.0  CL 103 104  CO2 26 27  GLUCOSE 98 95  BUN 11 10  CREATININE 0.61 0.57  CALCIUM 8.7* 8.7*   GFR Estimated Creatinine Clearance: 58 mL/min (by C-G formula based on SCr of 0.57 mg/dL). Liver Function Tests:  Recent Labs Lab 04/17/17 1407  AST 23  ALT 20  ALKPHOS 31*  BILITOT 0.4  PROT 6.4*  ALBUMIN 3.6    Recent Labs Lab 04/17/17 1407  LIPASE 64*   No results for input(s): AMMONIA in the last 168 hours. Coagulation profile  Recent Labs Lab 04/19/17 1642  INR 1.07    CBC:  Recent Labs Lab 04/17/17 1407  WBC 4.6  HGB 12.8  HCT 40.6  MCV 88.8  PLT 224   Cardiac Enzymes: No results for input(s): CKTOTAL, CKMB, CKMBINDEX, TROPONINI in the last 168 hours. BNP: Invalid input(s): POCBNP CBG:  Recent Labs Lab 04/20/17 0755  GLUCAP 91   D-Dimer No results for input(s): DDIMER in the last 72 hours. Hgb A1c No results for input(s): HGBA1C in the last 72 hours. Lipid Profile No results for input(s): CHOL, HDL, LDLCALC, TRIG, CHOLHDL, LDLDIRECT in the last 72 hours. Thyroid function studies No results for input(s): TSH, T4TOTAL, T3FREE, THYROIDAB in the last 72 hours.  Invalid input(s): FREET3 Anemia work up No results for input(s): VITAMINB12, FOLATE, FERRITIN, TIBC, IRON, RETICCTPCT in the last 72 hours. Microbiology Recent Results (from the past 240 hour(s))  Surgical pcr screen     Status: None   Collection Time: 04/19/17 11:07 PM  Result Value Ref Range Status   MRSA, PCR NEGATIVE NEGATIVE Final   Staphylococcus aureus NEGATIVE NEGATIVE Final    Comment:        The Xpert SA Assay (FDA approved for NASAL specimens in patients over 61 years of age), is one component of a comprehensive surveillance program.  Test performance has been validated by [facility] for patients greater than or equal to 27 year old. It is not intended to diagnose infection nor to guide or monitor treatment.       Studies:  Ct Chest W  Contrast  Result Date: 04/20/2017 CLINICAL DATA:  Gastric cancer. Staging. Admitted for possible J tube placement due to poor oral intake. EXAM: CT CHEST WITH CONTRAST TECHNIQUE: Multidetector CT imaging of the chest was performed during intravenous contrast administration. CONTRAST:  42mL ISOVUE-300 IOPAMIDOL (ISOVUE-300) INJECTION 61% COMPARISON:  None. FINDINGS: Cardiovascular: Normal heart size. No significant pericardial fluid/thickening. Great vessels are normal in course and caliber. No central pulmonary emboli. Mediastinum/Nodes: Subcentimeter hypodense left thyroid lobe nodule. Unremarkable esophagus. No pathologically enlarged axillary, mediastinal or hilar lymph nodes. Lungs/Pleura: No pneumothorax. No pleural effusion. No acute consolidative airspace disease, lung masses or significant pulmonary nodules. Upper abdomen: Visualized proximal stomach is distended with fluid and food debris. No appreciable wall thickening or discrete mass in the visualized proximal stomach. Musculoskeletal:  No aggressive appearing focal osseous lesions. IMPRESSION: No evidence of metastatic disease in the chest. No active pulmonary disease. Visualized proximal stomach is distended with fluid and debris. Electronically Signed   By: Ilona Sorrel M.D.   On: 04/20/2017 08:43   Dg Chest Port 1 View  Result Date: 04/20/2017 CLINICAL DATA:  Port-A-Cath placement, concomitant jejunostomy tube placement. EXAM: PORTABLE CHEST 1 VIEW COMPARISON:  04/19/2017 CT FINDINGS: The heart size and mediastinal contours are within normal limits. Port catheter tip is seen in the distal SVC. No pneumothorax is noted. Both lungs are clear. A gastric tube extends into the expected location of the stomach with scattered enteric contrast noted in the left upper quadrant. Free air beneath the right diaphragm may be explained by the same day jejunostomy tube placement but follow-up to ensure clearance is recommended. The visualized skeletal  structures are unremarkable. IMPRESSION: 1. Satisfactory left subclavian port placement without pneumothorax. Tip at the distal SVC. 2. Chest tube extends into the expected location of stomach. 3. Small amount of free air beneath the right diaphragm may be explained by recent jejunostomy tube placement. These results will be called to the ordering clinician or representative by the Radiologist Assistant, and communication documented in the PACS or zVision Dashboard. Electronically Signed   By: Ashley Royalty M.D.   On: 04/20/2017 15:10   Dg Fluoro Guide Cv Line-no Report  Result Date: 04/20/2017 Fluoroscopy was utilized by the requesting physician.  No radiographic interpretation.    Assessment: 41 y.o.[place] woman who  presented with abdominal pain, nausea, and emesis for 4-5 months and significant weight loss   1. Severe calorie and protein malnutrition, secondary to gastric cancer 2. 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 Abdominal pain, nausea and vomiting 4. Anorexia and weight loss  Plan:  -Unfortunately she was found to have diffuse peritoneal carcinomatosis during her lapascopic J-tube placement, biopsy done, path pending -I discussed the incurable nature of her metastatic disease with her husband and friend/sister Tiffieny, they will explain to her. We discussed that this is treatable, I recommend palliative chemo to control her disease, and prolong her life. We discussed that surgery is not an option for now, although if she has excellent response to chemo, I may refer her to Dr. Clovis Riley to see if she is a candidate for debulking and HIPEC -tube feeding per dietitian  -No need EUS for staging, I will inform Dr. Paulita Fujita  -if she goes home over the weekend, I will schedule her for chemo class, lab, dietitian consult and f/u with my PA late next week -plan to start palliative chemo FOLFOX ON 7/2, I will see her before  chemo.  -I will transfer her to Dr. Marlowe Aschoff service, I will see her as needed before discharge.   Truitt Merle, MD 04/20/2017  7:34 PM

## 2017-04-20 NOTE — Progress Notes (Signed)
START ON PATHWAY REGIMEN - Gastroesophageal     A cycle is every 14 days:     Oxaliplatin      Leucovorin      5-Fluorouracil      5-Fluorouracil   **Always confirm dose/schedule in your pharmacy ordering system**    Patient Characteristics: Distant Metastases (cM1/pM1) / Locally Recurrent Disease, Adenocarcinoma - Esophageal, GE Junction, and Gastric, First Line, HER2 Negative / Unknown Histology: Adenocarcinoma Disease Classification: Gastric Therapeutic Status: Distant Metastases (No Additional Staging) Would you be surprised if this patient died  in the next year<= I would NOT be surprised if this patient died in the next year Line of therapy: First Line HER2 Status: Awaiting Test Results Intent of Therapy: Non-Curative / Palliative Intent, Discussed with Patient 

## 2017-04-20 NOTE — Interval H&P Note (Signed)
History and Physical Interval Note:  04/20/2017 12:01 PM  Claire Bray  has presented today for surgery, with the diagnosis of Gastric Cancer  The various methods of treatment have been discussed with the patient and family. After consideration of risks, benefits and other options for treatment, the patient has consented to  Procedure(s): INSERTION PORT-A-CATH (Left) INSERTION OF J TUBE (N/A) as a surgical intervention .  The patient's history has been reviewed, patient examined, no change in status, stable for surgery.  I have reviewed the patient's chart and labs.  Questions were answered to the patient's satisfaction.     [name]

## 2017-04-20 NOTE — Care Management Note (Addendum)
Case Management Note  Patient Details  Name: Claire Bray MRN: [id number] Date of Birth: [birth year]  Subjective/Objective:                    Action/Plan:  Spoke to patient and female visitor at bedside regarding discharging home with tube feeds and home health. [name] is on her way to hospital and they would preferr she be included in discharge planning. Bedside nurse will call NCM when sister arrives.  Spoke with patient's sister Claire Bray [phone])  at bedside . Confirmed face sheet information . Offered choice, chose [facility].  Patient presently being taken down to surgery . Will continue talking with Claire Bray this afternoon regarding plans for home.   Claire Bray states patient has PCP at [facility] but unsure of MD name.  Expected Discharge Date:                  Expected Discharge Plan:  [place]  In-House Referral:     Discharge planning Services  CM Consult  Post Acute Care Choice:  Home Health Choice offered to:  Patient  DME Arranged:    DME Agency:     HH Arranged:    HH Agency:     Status of Service:     If discussed at [employer], dates discussed:    Additional Comments:  Marilu Favre, RN 04/20/2017, 10:18 AM

## 2017-04-21 LAB — GLUCOSE, CAPILLARY
GLUCOSE-CAPILLARY: 114 mg/dL — AB (ref 65–99)
GLUCOSE-CAPILLARY: 79 mg/dL (ref 65–99)
GLUCOSE-CAPILLARY: 91 mg/dL (ref 65–99)
Glucose-Capillary: 95 mg/dL (ref 65–99)
Glucose-Capillary: 105 mg/dL — ABNORMAL HIGH (ref 65–99)
Glucose-Capillary: 85 mg/dL (ref 65–99)

## 2017-04-21 NOTE — Progress Notes (Signed)
[facility] Progress Note  1 Day Post-Op  Subjective: CC:  Pain controlled on PCA. C/o sore throat from NG tube. Denies flatus or BM. Denies nausea or vomiting.  Translator used to provide patient education and answer all questions. Objective: Vital signs in last 24 hours: Temp:  [97.8 F (36.6 C)-99.2 F (37.3 C)] 99.2 F (37.3 C) (06/14 0424) Pulse Rate:  [51-83] 83 (06/14 0424) Resp:  [10-20] 10 (06/14 0738) BP: (96-131)/(71-95) 96/73 (06/14 0424) SpO2:  [96 %-100 %] 98 % (06/14 0738) Weight:  [39.9 kg (87 lb 15.4 oz)] 39.9 kg (87 lb 15.4 oz) (06/14 0500) Last BM Date: 04/17/17  Intake/Output from previous day: 06/13 0701 - 06/14 0700 In: 2722.4 [I.V.:2622.4] Out: 1075 [Urine:950; Emesis/NG output:100; Blood:25] Intake/Output this shift: No intake/output data recorded.  PE: Gen:  Alert, NAD, pleasant Card:  Regular rate and rhythm, pedal pulses 2+ BL Pulm:  Normal effort, clear to auscultation bilaterally Abd: Soft, appropriately tender, non-distended, bowel sounds present, J-tube site c/d/i w/ tube feeds running. Psych: A&Ox3   Lab Results:  No results for input(s): WBC, HGB, HCT, PLT in the last 72 hours. BMET  Recent Labs  04/19/17 1642  NA 139  K 4.0  CL 104  CO2 27  GLUCOSE 95  BUN 10  CREATININE 0.57  CALCIUM 8.7*   PT/INR  Recent Labs  04/19/17 1642  LABPROT 13.9  INR 1.07   CMP     Component Value Date/Time   NA 139 04/19/2017 1642   K 4.0 04/19/2017 1642   CL 104 04/19/2017 1642   CO2 27 04/19/2017 1642   GLUCOSE 95 04/19/2017 1642   BUN 10 04/19/2017 1642   CREATININE 0.57 04/19/2017 1642   CALCIUM 8.7 (L) 04/19/2017 1642   PROT 6.4 (L) 04/17/2017 1407   ALBUMIN 3.6 04/17/2017 1407   AST 23 04/17/2017 1407   ALT 20 04/17/2017 1407   ALKPHOS 31 (L) 04/17/2017 1407   BILITOT 0.4 04/17/2017 1407   GFRNONAA >60 04/19/2017 1642   GFRAA >60 04/19/2017 1642   Lipase     Component Value Date/Time   LIPASE 64 (H)  04/17/2017 1407       Studies/Results: Ct Chest W Contrast  Result Date: 04/20/2017 CLINICAL DATA:  Gastric cancer. Staging. Admitted for possible J tube placement due to poor oral intake. EXAM: CT CHEST WITH CONTRAST TECHNIQUE: Multidetector CT imaging of the chest was performed during intravenous contrast administration. CONTRAST:  33mL ISOVUE-300 IOPAMIDOL (ISOVUE-300) INJECTION 61% COMPARISON:  None. FINDINGS: Cardiovascular: Normal heart size. No significant pericardial fluid/thickening. Great vessels are normal in course and caliber. No central pulmonary emboli. Mediastinum/Nodes: Subcentimeter hypodense left thyroid lobe nodule. Unremarkable esophagus. No pathologically enlarged axillary, mediastinal or hilar lymph nodes. Lungs/Pleura: No pneumothorax. No pleural effusion. No acute consolidative airspace disease, lung masses or significant pulmonary nodules. Upper abdomen: Visualized proximal stomach is distended with fluid and food debris. No appreciable wall thickening or discrete mass in the visualized proximal stomach. Musculoskeletal:  No aggressive appearing focal osseous lesions. IMPRESSION: No evidence of metastatic disease in the chest. No active pulmonary disease. Visualized proximal stomach is distended with fluid and debris. Electronically Signed   By: Ilona Sorrel M.D.   On: 04/20/2017 08:43   Dg Chest Port 1 View  Result Date: 04/20/2017 CLINICAL DATA:  Port-A-Cath placement, concomitant jejunostomy tube placement. EXAM: PORTABLE CHEST 1 VIEW COMPARISON:  04/19/2017 CT FINDINGS: The heart size and mediastinal contours are within normal limits. Port catheter tip is seen  in the distal SVC. No pneumothorax is noted. Both lungs are clear. A gastric tube extends into the expected location of the stomach with scattered enteric contrast noted in the left upper quadrant. Free air beneath the right diaphragm may be explained by the same day jejunostomy tube placement but follow-up to ensure  clearance is recommended. The visualized skeletal structures are unremarkable. IMPRESSION: 1. Satisfactory left subclavian port placement without pneumothorax. Tip at the distal SVC. 2. Chest tube extends into the expected location of stomach. 3. Small amount of free air beneath the right diaphragm may be explained by recent jejunostomy tube placement. These results will be called to the ordering clinician or representative by the Radiologist Assistant, and communication documented in the PACS or zVision Dashboard. Electronically Signed   By: Ashley Royalty M.D.   On: 04/20/2017 15:10   Dg Fluoro Guide Cv Line-no Report  Result Date: 04/20/2017 Fluoroscopy was utilized by the requesting physician.  No radiographic interpretation.    Anti-infectives: Anti-infectives    None       Assessment/Plan Gastric adenocarcinoma with peritoneal metastasis   Protein calorie malnutrition POD#1 S/P  Left subclavian port placement, Bard ClearVue Power Port, MRI safe, 8-French, 18 Fr Jejunostomy tube placement 04/20/17 Dr. Stark Klein  - afebrile, VSS  - d/c NGT. start trickle tube feeds at 20 mL/hr and increase q 12h as tolerated to goal rate of 55 mL/hr, appreciate nutrition assistance  - PCA for pain, possible transition to PO or per tube meds tomorrow.  - OP follow up and palliative chemo starting 05/09/17 per Dr. Burr Medico  - no plans for future surgery at this time unless she becomes a candidate for a debulking procedure in the future.    LOS: 2 days    Jill Alexanders , [facility] 04/21/2017, 10:01 AM Pager: [phone] Consults: [phone] Mon-Fri 7:00 am-4:30 pm Sat-Sun 7:00 am-11:30 am

## 2017-04-21 NOTE — Anesthesia Postprocedure Evaluation (Signed)
Anesthesia Post Note  Patient: Claire Bray Thi DUY  Procedure(s) Performed: Procedure(s) (LRB): INSERTION PORT-A-CATH (Left) INSERTION OF J TUBE (N/A)     Patient location during evaluation: PACU Anesthesia Type: General Level of consciousness: awake and alert and patient cooperative Pain management: pain level controlled Vital Signs Assessment: post-procedure vital signs reviewed and stable Respiratory status: spontaneous breathing and respiratory function stable Cardiovascular status: stable Anesthetic complications: no    Last Vitals:  Vitals:   04/21/17 0424 04/21/17 0738  BP: 96/73   Pulse: 83   Resp: 18 10  Temp: 37.3 C     Last Pain:  Vitals:   04/21/17 0822  TempSrc:   PainSc: [name]

## 2017-04-21 NOTE — Care Management Note (Signed)
Case Management Note  Patient Details  Name: Claire Bray MRN: [id number] Date of Birth: [birth year]  Subjective/Objective:                    Action/Plan:  Discussed discharge planning again with sister Tiffany.   Patient does not have PCP she started going to Urgent Care when she became ill.   Tube Tubing and pump and home health RN arranged with [facility]. Expected Discharge Date:                  Expected Discharge Plan:  [place]  In-House Referral:     Discharge planning Services  CM Consult  Post Acute Care Choice:  Home Health Choice offered to:  Patient, Sibling  DME Arranged:  Tube feeding pump, Tube feeding DME Agency:  [place]:  RN [facility] Agency:  [place]  Status of Service:  In process, will continue to follow  If discussed at Long Length of Stay Meetings, dates discussed:    Additional Comments:  Marilu Favre, RN 04/21/2017, 9:42 AM

## 2017-04-22 ENCOUNTER — Encounter (HOSPITAL_COMMUNITY): Payer: Self-pay | Admitting: General Surgery

## 2017-04-22 ENCOUNTER — Encounter: Payer: Medicaid Other | Admitting: Obstetrics & Gynecology

## 2017-04-22 LAB — GLUCOSE, CAPILLARY
GLUCOSE-CAPILLARY: 104 mg/dL — AB (ref 65–99)
GLUCOSE-CAPILLARY: 109 mg/dL — AB (ref 65–99)
Glucose-Capillary: 111 mg/dL — ABNORMAL HIGH (ref 65–99)
Glucose-Capillary: 117 mg/dL — ABNORMAL HIGH (ref 65–99)
Glucose-Capillary: 121 mg/dL — ABNORMAL HIGH (ref 65–99)

## 2017-04-22 MED ORDER — HYDROCODONE-ACETAMINOPHEN 7.5-325 MG/15ML PO SOLN
10.0000 mL | ORAL | Status: DC | PRN
Start: 2017-04-22 — End: 2017-04-24
  Administered 2017-04-23: 10 mL via ORAL
  Filled 2017-04-22: qty 15

## 2017-04-22 MED ORDER — HYDROMORPHONE HCL 1 MG/ML IJ SOLN: 0.5000 mg | INTRAMUSCULAR | Status: DC | PRN

## 2017-04-22 NOTE — Progress Notes (Signed)
This RN wasted 60mL of Dilaudid PCA with Cameron Sprang, RN.

## 2017-04-22 NOTE — Discharge Summary (Signed)
[facility] Discharge Summary   Patient ID: Claire Bray MRN: [id number] DOB/AGE: 12/18/1975 41 y.o.  Admit date: 04/19/2017 Discharge date: 04/24/2017  Discharge Diagnosis Patient Active Problem List   Diagnosis Date Noted  . Protein-calorie malnutrition, severe 04/20/2017  . Gastric cancer ([place]) 04/18/2017  . Helicobacter pylori gastritis 01/12/2017  Gastric outlet obstruction.    Consultants Oncology - Dr. Truitt Merle  Imaging: 04/19/17 - CT CHEST W/  No evidence of metastatic disease in the chest. No active pulmonary Disease. Visualized proximal stomach is distended with fluid and debris.  04/20/17 - DG Chest  1. Satisfactory left subclavian port placement without pneumothorax. Tip at the distal SVC. 2. Chest tube extends into the expected location of stomach. 3. Small amount of free air beneath the right diaphragm may be explained by recent jejunostomy tube placement.   Procedures 04/20/17 - Dr. Stark Klein - INSERTION OF PORT-A-CATH, [place] [facility] Course:  Ms. Claire Bray is a 41 y.o. Female recently diagnosed with gastric cancer who was admitted to [facility] for management of abdominal pain, nausea, and malnutrition/poor oral intake 2/2 gastric outlet obstruction from gastric cancer. CT scan of the chest (above) not significant for any obvious metastasis. She underwent the procedure above and tolerated the procedure well. Intraoperatively the patient was found to have multiple small nodules in the omentum, along with a 6 mm nodule in the right abdomen, and multiple very small implants along the lower anterior abdominal wall. 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 The patient was started on trickle tube feeds on POD#1 and feeds were gradually advanced, q 12h, as tolerated to goal rate. On 04/24/17 the patients vitals were stable, pain controlled, having bowel function, mobilizing  and clinically stable for discharge. She will follow up with Dr. Burr Medico as below for initiation of chemotherapy.   Discharge diet of full liquids only was reviewed with patient with vietnamese interpreter by audio with MD.  Physical Exam: General:  Alert, NAD, pleasant, comfortable Abd:  Soft, ND, nontender, incisions C/D/I, drain with minimal sanguinous drainage    Allergies as of 04/24/2017   No Known Allergies     Medication List    STOP taking these medications   omeprazole 20 MG capsule Commonly known as:  PRILOSEC     TAKE these medications   dicyclomine 20 MG tablet Commonly known as:  BENTYL Take 1 tablet (20 mg total) by mouth 2 (two) times daily.   feeding supplement (OSMOLITE 1.2 CAL) Liqd Place 1,000 mLs into feeding tube continuous.   HYDROcodone-acetaminophen 7.5-325 mg/15 ml solution Commonly known as:  HYCET Take 10 mLs by mouth every 4 (four) hours as needed for moderate pain or severe pain.   ibuprofen 600 MG tablet Commonly known as:  ADVIL,MOTRIN Take 1 tablet (600 mg total) by mouth every 6 (six) hours.   metoCLOPramide 5 MG tablet Commonly known as:  REGLAN Take 2 tablets (10 mg total) by mouth 4 (four) times daily -  before meals and at bedtime.   ondansetron 8 MG tablet Commonly known as:  ZOFRAN Take 1 tablet (8 mg total) by mouth every 8 (eight) hours as needed for nausea or vomiting. What changed:  Another medication with the same name was added. Make sure you understand how and when to take each.   ondansetron 4 MG tablet Commonly known as:  ZOFRAN Take 1-2 tablets (4-8 mg total) by mouth every 8 (  eight) hours as needed for nausea. What changed:  You were already taking a medication with the same name, and this prescription was added. Make sure you understand how and when to take each.   pantoprazole 40 MG tablet Commonly known as:  PROTONIX Take 1 tablet (40 mg total) by mouth daily at 12 noon.   promethazine 25 MG suppository Commonly  known as:  PHENERGAN Place 1 suppository (25 mg total) rectally every 6 (six) hours as needed for nausea.   zolpidem 5 MG tablet Commonly known as:  AMBIEN Take 1 tablet (5 mg total) by mouth at bedtime as needed for sleep.            Durable Medical Equipment        Start     Ordered   04/21/17 0753  For home use only DME Tube feeding pump  Once     04/21/17 0752   04/21/17 0751  For home use only DME Tube feeding  Once    Comments:  Severe malnutrition in context of chronic illness, Underweight  INTERVENTION:   -Initiate Osmolite 1.2 @ 25 ml/hr via j-tube and increase by 10 ml every 12 hours to goal rate of 55 ml/hr.   Tube feeding regimen provides 1584 kcal (100% of needs), 73 grams of protein, and 1082 ml of H2O.   If no IVFS, recommend 125 ml free water flush every 6 hours.     NUTRITION DIAGNOSIS:   Malnutrition (Severe) related to chronic illness (gastric cancer) as evidenced by energy intake < 75% for > or equal to 1 month, severe depletion of body fat, severe depletion of muscle mass, percent weight loss.  GOAL:   Patient will meet greater than or equal to 90% of their needs   Patient will need tube feeding greater than 90 days   04/21/17 0752      Follow-up Information    Truitt Merle, MD Follow up.   Specialties:  Hematology, Oncology Why:  call to confirm appointment date/time. Contact information: [place] [address] [phone]

## 2017-04-22 NOTE — Progress Notes (Signed)
Patient ID: Claire Bray, female   DOB: 1976/04/27, 41 y.o.   MRN: [id number]  [facility] Progress Note  2 Days Post-Op  Subjective: CC- none Sitting up in chair. No complaints this morning. Tolerating few clear liquids PO. Tolerating TF at 51mL/hr. Passing small amount of flatus. No BM. Denies abdominal bloating, n/v. Ambulating halls with no problem.  Objective: Vital signs in last 24 hours: Temp:  [98.5 F (36.9 C)-99.1 F (37.3 C)] 98.5 F (36.9 C) (06/15 0535) Pulse Rate:  [73-83] 82 (06/15 0535) Resp:  [9-26] 14 (06/15 0827) BP: (93-102)/(62-74) 101/62 (06/15 0535) SpO2:  [96 %-100 %] 100 % (06/15 0827) Weight:  [85 lb 5.1 oz (38.7 kg)] 85 lb 5.1 oz (38.7 kg) (06/15 0535) Last BM Date: 04/17/17  Intake/Output from previous day: 06/14 0701 - 06/15 0700 In: 1767.6 [P.O.:240; I.V.:1173.3; NG/GT:354.3] Out: 1900 [Urine:1900] Intake/Output this shift: No intake/output data recorded.  PE: Gen:  Alert, NAD, pleasant HEENT: EOM's intact, pupils equal  Card:  RRR, no M/G/R heard Pulm:  effort normal Abd: Soft, NT/ND, J-tube site cdi, TF running at 93mL/hr Ext:  No erythema, edema, or tenderness BUE/BLE  Psych: A&Ox3   Lab Results:  No results for input(s): WBC, HGB, HCT, PLT in the last 72 hours. BMET  Recent Labs  04/19/17 1642  NA 139  K 4.0  CL 104  CO2 27  GLUCOSE 95  BUN 10  CREATININE 0.57  CALCIUM 8.7*   PT/INR  Recent Labs  04/19/17 1642  LABPROT 13.9  INR 1.07   CMP     Component Value Date/Time   NA 139 04/19/2017 1642   K 4.0 04/19/2017 1642   CL 104 04/19/2017 1642   CO2 27 04/19/2017 1642   GLUCOSE 95 04/19/2017 1642   BUN 10 04/19/2017 1642   CREATININE 0.57 04/19/2017 1642   CALCIUM 8.7 (L) 04/19/2017 1642   PROT 6.4 (L) 04/17/2017 1407   ALBUMIN 3.6 04/17/2017 1407   AST 23 04/17/2017 1407   ALT 20 04/17/2017 1407   ALKPHOS 31 (L) 04/17/2017 1407   BILITOT 0.4 04/17/2017 1407   GFRNONAA >60 04/19/2017 1642   GFRAA >60 04/19/2017 1642   Lipase     Component Value Date/Time   LIPASE 64 (H) 04/17/2017 1407       Studies/Results: Dg Chest Port 1 View  Result Date: 04/20/2017 CLINICAL DATA:  Port-A-Cath placement, concomitant jejunostomy tube placement. EXAM: PORTABLE CHEST 1 VIEW COMPARISON:  04/19/2017 CT FINDINGS: The heart size and mediastinal contours are within normal limits. Port catheter tip is seen in the distal SVC. No pneumothorax is noted. Both lungs are clear. A gastric tube extends into the expected location of the stomach with scattered enteric contrast noted in the left upper quadrant. Free air beneath the right diaphragm may be explained by the same day jejunostomy tube placement but follow-up to ensure clearance is recommended. The visualized skeletal structures are unremarkable. IMPRESSION: 1. Satisfactory left subclavian port placement without pneumothorax. Tip at the distal SVC. 2. Chest tube extends into the expected location of stomach. 3. Small amount of free air beneath the right diaphragm may be explained by recent jejunostomy tube placement. These results will be called to the ordering clinician or representative by the Radiologist Assistant, and communication documented in the PACS or zVision Dashboard. Electronically Signed   By: Ashley Royalty M.D.   On: 04/20/2017 15:10   Dg Fluoro Guide Cv Line-no Report  Result Date: 04/20/2017 Fluoroscopy was utilized  by the requesting physician.  No radiographic interpretation.    Anti-infectives: Anti-infectives    None       Assessment/Plan Gastric adenocarcinoma with peritoneal metastasis   Protein calorie malnutrition POD#2 S/P Leftsubclavian port placement, Bard ClearVue Power Port, MRI safe, 8-French, 18 Fr Jejunostomy tube placement 04/20/17 Dr. Stark Klein             - afebrile, VSS             -tube feed at 64mL/hr, continue to increase q 12h as tolerated to goal rate of 55 mL/hr             - d/c PCA for pain and  start PO Hycet PRN pain             - OP follow up and palliative chemo starting 05/09/17 per Dr. Burr Medico             - no plans for future surgery at this time unless she becomes a candidate for a debulking procedure in the future.   LOS: 3 days    Jerrye Beavers , [facility] 04/22/2017, 10:03 AM Pager: [phone] Consults: [phone] Mon-Fri 7:00 am-4:30 pm Sat-Sun 7:00 am-11:30 am

## 2017-04-22 NOTE — Progress Notes (Signed)
Nutrition Follow-up  DOCUMENTATION CODES:   Severe malnutrition in context of chronic illness, Underweight  INTERVENTION:   -Continue Osmolite 1.2 @ 45 ml/hr via j-tube and increase by 10 ml every 12 hours to goal rate of 55 ml/hr.   Tube feeding regimen provides 1584 kcal (100% of needs), 73 grams of protein, and 1082 ml of H2O.   If no IVFS, recommend 125 ml free water flush every 6 hours.   NUTRITION DIAGNOSIS:   Malnutrition (Severe) related to chronic illness (gastric cancer) as evidenced by energy intake < 75% for > or equal to 1 month, severe depletion of body fat, severe depletion of muscle mass, percent weight loss.  Ongoing  GOAL:   Patient will meet greater than or equal to 90% of their needs  Progressing  MONITOR:   Labs, Weight trends, TF tolerance, Skin, I & O's  REASON FOR ASSESSMENT:   Consult Enteral/tube feeding initiation and management  ASSESSMENT:   Pt is a 41 year old [place] speaking female admitted for placement of J-tube and Port-A-Cath. Patient has been having epigastric pain with nausea and vomiting since January. She has lost approximately 25 pounds since then. She underwent endoscopy by Dr. Paulita Fujita on 04/12/17 revealing a large, fungating, infiltrative mass in the pyloric region of the stomach extending to the pylorus distally and proximally along the lesser curve of the antrum and body. Biopsies revealed adenocarcinoma.  S/p PROCEDURE on 04/20/17: Leftsubclavian port placement, Bard ClearVue Power Port, MRI safe, 8-French, 18 Fr Jejunostomy tube placement  6/14- NGT d/c  Case discussed with RN. Osmolite 1.2 is currently infusing via j-tube @ 45 ml/hr (providing 1134 kcals, 60 grams protein, and 88 ml free water- meeting 81% of estimated kcal needs and 92% of estimated protein needs). Tolerating well, per RN, with plan to advance to goal rate tonight. Pt also tolerating sips of clear liquids without difficulty.   Per [facility] and surgery notes,  plan to d/c home tomorrow (04/23/17). 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 Per oncology notes, plan for outpatient palliative chemotherapy on 05/09/17.   Labs reviewed: CBGS: 85-121.   Diet Order:  Diet bariatric clear liquid Room service appropriate? Yes; Fluid consistency: Thin  Skin:  Reviewed, no issues  Last BM:  04/17/17  Height:   Ht Readings from Last 1 Encounters:  04/19/17 5\' 4"  (1.626 m)    Weight:   Wt Readings from Last 1 Encounters:  04/22/17 85 lb 5.1 oz (38.7 kg)    Ideal Body Weight:  54.5 kg  BMI:  Body mass index is 14.64 kg/m.  Estimated Nutritional Needs:   Kcal:  1400-1600  Protein:  65-80 grams  Fluid:  1.4-1.6 L  EDUCATION NEEDS:   Education needs addressed  Cortasia Screws A. Jimmye Norman, RD, LDN, CDE Pager: [phone] After hours Pager: [phone]

## 2017-04-22 NOTE — Progress Notes (Signed)
PCA d/c'd, wasted Dilaudid 17 ml, witnessed by Mayra Neer.

## 2017-04-23 DIAGNOSIS — R1084 Generalized abdominal pain: Secondary | ICD-10-CM

## 2017-04-23 LAB — GLUCOSE, CAPILLARY
GLUCOSE-CAPILLARY: 112 mg/dL — AB (ref 65–99)
GLUCOSE-CAPILLARY: 94 mg/dL (ref 65–99)
Glucose-Capillary: 103 mg/dL — ABNORMAL HIGH (ref 65–99)
Glucose-Capillary: 108 mg/dL — ABNORMAL HIGH (ref 65–99)
Glucose-Capillary: 117 mg/dL — ABNORMAL HIGH (ref 65–99)
Glucose-Capillary: 93 mg/dL (ref 65–99)

## 2017-04-23 MED ORDER — ENOXAPARIN SODIUM 40 MG/0.4ML ~~LOC~~ SOLN
40.0000 mg | SUBCUTANEOUS | Status: DC
  Administered 2017-04-23: 40 mg via SUBCUTANEOUS
  Filled 2017-04-23: qty 0.4

## 2017-04-23 MED ORDER — PANTOPRAZOLE SODIUM 40 MG PO TBEC
40.0000 mg | DELAYED_RELEASE_TABLET | Freq: Every day | ORAL | Status: DC
  Administered 2017-04-23 – 2017-04-24 (×2): 40 mg via ORAL
  Filled 2017-04-23 (×2): qty 1

## 2017-04-23 MED ORDER — BISACODYL 10 MG RE SUPP
10.0000 mg | Freq: Once | RECTAL | Status: AC
  Administered 2017-04-23: 10 mg via RECTAL
  Filled 2017-04-23: qty 1

## 2017-04-23 MED ORDER — ENOXAPARIN SODIUM 30 MG/0.3ML ~~LOC~~ SOLN: 30.0000 mg | SUBCUTANEOUS | Status: DC

## 2017-04-23 NOTE — Progress Notes (Signed)
Claire Bray   DOB:February 04, 1976   CH#:[id number]   PNT#:[id number]  ONCOLOGY FOLLOW UP NOTE   Subjective: I stopped by to see pt before her discharge. She is sitting in chair, tolerating tube feeds well, no other complaints. Husband at bedside. She had a small bowel movement after suppository this morning. Likely going home tomorrow.  Objective:  Vitals:   04/23/17 0544 04/23/17 1300  BP: 111/78 104/77  Pulse: 77 79  Resp: 16 16  Temp: 97.7 F (36.5 C) 97.7 F (36.5 C)    Body mass index is 16.88 kg/m.  Intake/Output Summary (Last 24 hours) at 04/23/17 1636 Last data filed at 04/23/17 1230  Gross per 24 hour  Intake          2525.67 ml  Output             3050 ml  Net          -524.33 ml     Sclerae unicteric  Oropharynx clear, she has NG tube in   No peripheral adenopathy  Lungs clear -- no rales or rhonchi  Heart regular rate and rhythm  Abdomen , (+) J-tube   MSK no focal spinal tenderness, no peripheral edema  Neuro nonfocal    CBG (last 3)   Recent Labs  04/23/17 0757 04/23/17 1232 04/23/17 1605  GLUCAP 117* 108* 94     Labs:  Lab Results  Component Value Date   WBC 4.6 04/17/2017   HGB 12.8 04/17/2017   HCT 40.6 04/17/2017   MCV 88.8 04/17/2017   PLT 224 04/17/2017   CMP Latest Ref Rng & Units 04/19/2017 04/17/2017 04/06/2017  Glucose 65 - 99 mg/dL 95 98 90  BUN 6 - 20 mg/dL 10 11 10   Creatinine 0.44 - 1.00 mg/dL 0.57 0.61 0.70  Sodium 135 - 145 mmol/L 139 136 136  Potassium 3.5 - 5.1 mmol/L 4.0 3.9 3.8  Chloride 101 - 111 mmol/L 104 103 101  CO2 22 - 32 mmol/L 27 26 28   Calcium 8.9 - 10.3 mg/dL 8.7(L) 8.7(L) 8.7(L)  Total Protein 6.5 - 8.1 g/dL - 6.4(L) 6.3(L)  Total Bilirubin 0.3 - 1.2 mg/dL - 0.4 0.3  Alkaline Phos 38 - 126 U/L - 31(L) 31(L)  AST 15 - 41 U/L - 23 14(L)  ALT 14 - 54 U/L - 20 10(L)     Urine Studies No results for input(s): UHGB, CRYS in the last 72 hours.  Invalid input(s): UACOL, UAPR, USPG, UPH, UTP, UGL, UKET, UBIL,  UNIT, UROB, ULEU, UEPI, UWBC, URBC, UBAC, CAST, UCOM, BILUA  Basic Metabolic Panel:  Recent Labs Lab 04/17/17 1407 04/19/17 1642  NA 136 139  K 3.9 4.0  CL 103 104  CO2 26 27  GLUCOSE 98 95  BUN 11 10  CREATININE 0.61 0.57  CALCIUM 8.7* 8.7*   GFR Estimated Creatinine Clearance: 64.5 mL/min (by C-G formula based on SCr of 0.57 mg/dL). Liver Function Tests:  Recent Labs Lab 04/17/17 1407  AST 23  ALT 20  ALKPHOS 31*  BILITOT 0.4  PROT 6.4*  ALBUMIN 3.6    Recent Labs Lab 04/17/17 1407  LIPASE 64*   No results for input(s): AMMONIA in the last 168 hours. Coagulation profile  Recent Labs Lab 04/19/17 1642  INR 1.07    CBC:  Recent Labs Lab 04/17/17 1407  WBC 4.6  HGB 12.8  HCT 40.6  MCV 88.8  PLT 224   Cardiac Enzymes: No results for input(s): CKTOTAL, CKMB, CKMBINDEX,  TROPONINI in the last 168 hours. BNP: Invalid input(s): POCBNP CBG:  Recent Labs Lab 04/23/17 0014 04/23/17 0429 04/23/17 0757 04/23/17 1232 04/23/17 1605  GLUCAP 103* 112* 117* 108* 94   D-Dimer No results for input(s): DDIMER in the last 72 hours. Hgb A1c No results for input(s): HGBA1C in the last 72 hours. Lipid Profile No results for input(s): CHOL, HDL, LDLCALC, TRIG, CHOLHDL, LDLDIRECT in the last 72 hours. Thyroid function studies No results for input(s): TSH, T4TOTAL, T3FREE, THYROIDAB in the last 72 hours.  Invalid input(s): FREET3 Anemia work up No results for input(s): VITAMINB12, FOLATE, FERRITIN, TIBC, IRON, RETICCTPCT in the last 72 hours. Microbiology Recent Results (from the past 240 hour(s))  Surgical pcr screen     Status: None   Collection Time: 04/19/17 11:07 PM  Result Value Ref Range Status   MRSA, PCR NEGATIVE NEGATIVE Final   Staphylococcus aureus NEGATIVE NEGATIVE Final    Comment:        The Xpert SA Assay (FDA approved for NASAL specimens in patients over 1 years of age), is one component of a comprehensive surveillance program.   Test performance has been validated by [facility] for patients greater than or equal to 98 year old. It is not intended to diagnose infection nor to guide or monitor treatment.     PATHOLOGY REPORT  Diagnosis Omentum, resection for tumor - METASTATIC CARCINOMA WITH FOCAL SIGNET RING CELL DIFFERENTIATION. - ONE BENIGN LYMPH NODE (0/1). - SEE COMMENT. Microscopic Comment Given the clinical history as well as the histologic findings, the features are consistent with metastatic gastric carcinoma. Additional studies can be performed upon clinician request. (JBK:ecj 04/21/2017)    Studies:  No results found.  Assessment: 41 y.o.[place] woman who presented with abdominal pain, nausea, and emesis for 4-5 months and significant weight loss   1. Severe calorie and protein malnutrition, secondary to gastric cancer 2. 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 Abdominal pain, nausea and vomiting 4. Anorexia and weight loss  Plan:  -she is tolerating tube feeding well, at goal, had BM today, likely going home tomorrow, will be discharged from Dr. Marlowe Aschoff service. I appreciate Dr. Marlowe Aschoff excellent care  -I have signed her home care orders including tube feeds yesterday, hopefully home care has been set up, I did call the floor case manager and left a message -She is scheduled to see our dietitian Pamala Hurry, lab and follow-up with our NP, and chem class on 6/20, and first cycle chemo on 7/3 -I recommend her to have low residual/full liquid diet due to gastric outlet objection if she wants to eat.  -I will see her on 7/3. I will be out of office for next two weeks, she knows to call my office if she has any questions   Truitt Merle, MD 04/23/2017  4:36 PM

## 2017-04-23 NOTE — Progress Notes (Addendum)
[facility] Progress Note  3 Days Post-Op  Subjective: CC:  Video vietnamese interpreter used. Pt denies n/v.  Does have bloating.  Passing gas.  Tolerating clears. Tolerating tube feeds at goal. Also has some pain at port site.  Objective: Vital signs in last 24 hours: Temp:  [97.7 F (36.5 C)-98.6 F (37 C)] 97.7 F (36.5 C) (06/16 0544) Pulse Rate:  [76-83] 77 (06/16 0544) Resp:  [16] 16 (06/16 0544) BP: (106-111)/(65-78) 111/78 (06/16 0544) SpO2:  [98 %-100 %] 100 % (06/16 0544) Weight:  [44.6 kg (98 lb 5.2 oz)] 44.6 kg (98 lb 5.2 oz) (06/16 0544) Last BM Date: 04/18/17  Intake/Output from previous day: 06/15 0701 - 06/16 0700 In: 2770.7 [P.O.:685; I.V.:1200; NG/GT:765.7] Out: 1750 [Urine:1750] Intake/Output this shift: Total I/O In: -  Out: 1300 [Urine:1300]  PE: Gen:  Alert, NAD, pleasant No hematoma at port site.   Card:  Regular rate, palpable pulses Pulm:  Normal effort Abd: Soft, appropriately tender, slightly distended, J-tube site c/d/i w/ tube feeds running. Dressing removed and midline wound c/d/i.  No erythema or discharge.   Psych: A&Ox3   Lab Results:  No results for input(s): WBC, HGB, HCT, PLT in the last 72 hours. BMET No results for input(s): NA, K, CL, CO2, GLUCOSE, BUN, CREATININE, CALCIUM in the last 72 hours. PT/INR No results for input(s): LABPROT, INR in the last 72 hours. CMP     Component Value Date/Time   NA 139 04/19/2017 1642   K 4.0 04/19/2017 1642   CL 104 04/19/2017 1642   CO2 27 04/19/2017 1642   GLUCOSE 95 04/19/2017 1642   BUN 10 04/19/2017 1642   CREATININE 0.57 04/19/2017 1642   CALCIUM 8.7 (L) 04/19/2017 1642   PROT 6.4 (L) 04/17/2017 1407   ALBUMIN 3.6 04/17/2017 1407   AST 23 04/17/2017 1407   ALT 20 04/17/2017 1407   ALKPHOS 31 (L) 04/17/2017 1407   BILITOT 0.4 04/17/2017 1407   GFRNONAA >60 04/19/2017 1642   GFRAA >60 04/19/2017 1642   Lipase     Component Value Date/Time   LIPASE 64 (H)  04/17/2017 1407       Studies/Results: No results found.  Anti-infectives: Anti-infectives    None       Assessment/Plan Gastric adenocarcinoma with peritoneal metastasis   Protein calorie malnutrition Gastric Outlet obstruction POD#3 S/P  Left subclavian port placement, Bard ClearVue Power Port, MRI safe, 8-French, 18 Fr Jejunostomy tube placement 04/20/17 Dr. Stark Klein  - afebrile, VSS  - Tube feeds at goal.  - suppository today.  - possible d/c tomorrow if has BM and HH available.    - OP follow up and palliative chemo starting 05/09/17 per Dr. Burr Medico  - no plans for future resection of primary given metastatic disease.   - Add lovenox for VTE prophylaxis.     LOS: 4 days    [place] [facility] 04/23/2017, 9:42 AM

## 2017-04-24 LAB — GLUCOSE, CAPILLARY
GLUCOSE-CAPILLARY: 101 mg/dL — AB (ref 65–99)
GLUCOSE-CAPILLARY: 111 mg/dL — AB (ref 65–99)
Glucose-Capillary: 106 mg/dL — ABNORMAL HIGH (ref 65–99)
Glucose-Capillary: 96 mg/dL (ref 65–99)

## 2017-04-24 MED ORDER — PANTOPRAZOLE SODIUM 40 MG PO TBEC: 40.0000 mg | DELAYED_RELEASE_TABLET | Freq: Every day | ORAL | 3 refills | Status: DC

## 2017-04-24 MED ORDER — HYDROCODONE-ACETAMINOPHEN 7.5-325 MG/15ML PO SOLN: 10.0000 mL | ORAL | 0 refills | Status: DC | PRN

## 2017-04-24 MED ORDER — OSMOLITE 1.2 CAL PO LIQD: 1000.0000 mL | ORAL | 11 refills | Status: DC

## 2017-04-24 MED ORDER — ONDANSETRON HCL 4 MG PO TABS: 4.0000 mg | ORAL_TABLET | Freq: Three times a day (TID) | ORAL | 0 refills | Status: DC | PRN

## 2017-04-24 NOTE — Discharge Instructions (Addendum)
Ch?m [place] ?ng Thng Cho ?n (Care of a Feeding Tube) Nh?ng ng??i b? kh nu?t ho?c khng th? ?n th?c ?n ho?c u?ng thu?c qua ???ng mi?ng ?i khi ???c cho s? d?ng ?ng thng ?? cho ?n. M?t ?ng thng ?? cho ?n c th? ?i vo m?i v xu?ng d? dy ho?c qua da ? b?ng v vo trong d? dy ho?c ru?t non. M?t s? tn c?a cc ?ng thng ?? cho ?n ny l ?ng thng trong th? thu?t m? thng d? dy, ?ng PEG, ?ng thng m?i d? dy v ?ng thng trong th? thu?t n?i d? dy h?ng trng. ?? CUNG C?P C?N ?? CH?M [place] CH? ??T ?NG  G?ng tay s?ch.  Kh?n ?? r?a s?ch, mi?ng g?c ho?c kh?n gi?y m?m.  T?m bng.  Thu?c m? ho?c kem t?o mng ch?n ? da.  X phng v n??c.  Mi?ng b?t ho?c g?c ? c?t s?n (cu?n quanh ?ng).  B?ng dn ?ng.  CH?M [place] CH? ??T ?NG 1. Chu?n b? s?n t?t c? cc ?? cung c?p. 2. R?a s?ch tay. 3. ?eo g?ng tay s?ch. 4. Lo?i b? mi?ng b?t ho?c g?c b? b?n, n?u c, ???c tm th?y bn d??i d?ng c? c? ??nh ?ng. Thay mi?ng b?t ho?c g?c hng ngy ho?c khi b? b?n ho?c b? ?m ??t. 5. Ki?m tra da [place] ch? ??t ?ng xem c b? t?y ??, pht ban, s?ng n?, r? n??c ho?c pht tri?n thm m khng. N?u b?n nh?n th?y b?t k? v?n ?? no trong s? cc v?n ?? ny, hy g?i cho chuyn gia ch?m  s?c kh?e c?a mnh. 6. Lm ?m g?c v t?m bng b?ng n??c v x phng. 7. Lau s?ch khu v?c g?n ?ng nh?t (ngay g?n l? thot) b?ng t?m bng. Lau da xung quanh b?ng g?c ?m. R?a s?ch b?ng n??c. 8. Lm kh da v ch? ??t l? thot b?ng mi?ng g?c kh ho?c kh?n gi?y m?m. Khng thoa thu?c m? khng sinh vo ch? ??t ?ng. 9. N?u da b? ??, hy thoa kem ho?c thu?c m? t?o mng ch?n ? da (ch?ng h?n nh? m? khong) theo chuy?n ??ng vng trn, b?ng cch s? d?ng t?m bng. Kem ho?c thu?c m? s? cung c?p m?t l?p mng ch?n ?m cho da v gip v?t th??ng mau lnh. 10. Cu?n m?t mi?ng b?t ho?c g?c c?t s?n m?i quanh ?ng. Dnh ch?t n b?ng b?ng dnh xung quanh cc c?nh. N?u khng r? n??c, mi?ng b?t ho?c g?c c th? ???c tho b?. 11. S? d?ng b?ng dnh ho?c d?ng c? neo ?? si?t ch?t  ?ng thng ?? cho ?n vo da ?? t?o c?m gic tho?i mi ho?c theo ch? d?n. Xoay n?i b?n dn b?ng vo ?ng ?? trnh lm t?n th??ng da do ch?t k?t dnh. 12. ?? ng??i ? t? th? n?a n?m n?a ng?i (gc 30-45 ??). 13. V?t b? ?? cung c?p ? s? d?ng. 80. Tho g?ng tay. 15. R?a tay.  ?? CUNG C?P C?N ?? X? ?NG THNG CHO ?N  G?ng tay s?ch.  Xylanh 60 ml (k?t n?i v?i ?ng thng cho ?n).  Kh?n.  N???c.  X? ?NG THNG CHO ?N 1. Chu?n b? s?n t?t c? cc ?? cung c?p. 2. R?a s?ch tay. 3. ?eo g?ng tay s?ch. 4. Ht 30 ml n??c vo xylanh. 5. Xo?n l?i ?ng thng cho ?n trong khi ng?t k?t n?i n v?i ?ng c?a ti ??ng th?c ?n ho?c trong khi rt nt ? ??u ?ng. Xo?n l?i ?? ?ng ?ng  v ng?n khng cho d?ch trong ?ng trn ra ngoi. 6. C?m ??u xylanh vo ??u ?ng thng cho ?n. Th? ch? xo?n. T? t? b?m n??c. 7. N?u khng th? b?m n??c, ng??i s? d?ng ?ng thng cho ?n c?n n?m nghing v? bn tri. ??u ?ng c th? t? vo thnh d? dy, lm t?c dng ch?y. Thay ??i v? tr c th? d?ch chuy?n ??u ?ng ra kh?i thnh d? dy. Sau khi ??nh v? l?i, hy th? b?m n??c l?i. ? Khng s? d?ng xylanh nh? h?n 60 ml ?? x? ?ng. ? Khng s? d?ng l?c qu m?c ?? th?ng s?c c?n v ?i?u ny c th? khi?n ?ng b? v?. 8. Sau khi b?m n??c, hy tho xylanh ra. 9. Lun x? tr??c l?n dng thu?c ??u tin, gi?a cc l?n dng thu?c v sau l?n dng thu?c cu?i cng tr??c khi b?t ??u cho ?n. Cch ny gip trnh khng cho thu?c lm t?c ?ng. ? Khng tr?n thu?c v?i s?a b?t cng th?c ho?c v?i cc lo?i thu?c khc tr??c khi cho dng thu?c. ? X? s?ch thu?c ra kh?i ?ng ?? thu?c khng tr?n l?n v?i s?a b?t cng th?c. 10. V?t b? ?? cung c?p ? s? d?ng. 11. Tho g?ng tay. 12. R?a tay.  Thng tin ny khng nh?m m?c ?ch thay th? cho l?i khuyn m chuyn gia ch?m [place] s?c kh?e ni v?i qu v?. Hy b?o ??m qu v? ph?i th?o lu?n b?t k? v?n ?? g m qu v? c v?i chuyn gia ch?m [place] s?c kh?e c?a qu v?. Document Released: 04/14/2010 Document Revised: 06/27/2013 Document Reviewed:  06/08/2012 Elsevier Interactive Patient Education  2017 [employer] Inc.   Full Liquid Diet A full liquid diet may be used:  To help you transition from a clear liquid diet to a soft diet.  When your body is healing and can only tolerate foods that are easy to digest.  Before or after certain a procedure, test, or surgery (such as stomach or intestinal surgeries).  If you have trouble swallowing or chewing.  A full liquid diet includes fluids and foods that are liquid or will become liquid at room temperature. The full liquid diet gives you the proteins, fluids, salts, and minerals that you need for energy. If you continue this diet for more than 72 hours, talk to your health care provider about how many calories you need to consume. If you continue the diet for more than 5 days, talk to your health care provider about taking a multivitamin or a nutritional supplement. What do I need to know about a full liquid diet?  You may have any liquid.  You may have any food that becomes a liquid at room temperature. The food is considered a liquid if it can be poured off a spoon at room temperature.  Drink one serving of citrus or vitamin C-enriched fruit juice daily. What foods can I eat? Grains Any grain food that can be pureed in soup (such as crackers, pasta, and rice). Hot cereal (such as farina or oatmeal) that has been blended. Talk to your health care provider or dietitian about these foods. Vegetables Pulp-free tomato or vegetable juice. Vegetables pureed in soup. Fruits Fruit juice, including nectars and juices with pulp. Meats and Other Protein Sources Eggs in custard, eggnog mix, and eggs used in ice cream or pudding. Strained meats, like in baby food, may be allowed. Consult your health care provider. Dairy Milk and milk-based beverages, including milk shakes and instant breakfast mixes. Smooth  yogurt. Pureed cottage cheese. Avoid these foods if they are not well  tolerated. Beverages All beverages, including liquid nutritional supplements. Ask your health care provider if you can have carbonated beverages. They may not be well tolerated. Condiments Iodized salt, pepper, spices, and flavorings. Cocoa powder. Vinegar, ketchup, yellow mustard, smooth sauces (such as hollandaise, cheese sauce, or white sauce), and soy sauce. Sweets and Desserts Custard, smooth pudding. Flavored gelatin. Tapioca, junket. Plain ice cream, sherbet, fruit ices. Frozen ice pops, frozen fudge pops, pudding pops, and other frozen bars with cream. Syrups, including chocolate syrup. Sugar, honey, jelly. Fats and Oils Margarine, butter, cream, sour cream, and oils. Other Broth and cream soups. Strained, broth-based soups. The items listed above may not be a complete list of recommended foods or beverages. Contact your dietitian for more options. What foods can I not eat? Grains All breads. Grains are not allowed unless they are pureed into soup. Vegetables Vegetables are not allowed unless they are juiced, or cooked and pureed into soup. Fruits Fruits are not allowed unless they are juiced. Meats and Other Protein Sources Any meat or fish. Cooked or raw eggs. Nut butters. Dairy Cheese. Condiments Stone ground mustards. Fats and Oils Fats that are coarse or chunky. Sweets and Desserts Ice cream or other frozen desserts that have any solids in them or on top, such as nuts, chocolate chips, and pieces of cookies. Cakes. Cookies. Candy. Others Soups with chunks or pieces in them. The items listed above may not be a complete list of foods and beverages to avoid. Contact your dietitian for more information. This information is not intended to replace advice given to you by your health care provider. Make sure you discuss any questions you have with your health care provider. Document Released: 10/25/2005 Document Revised: 04/01/2016 Document Reviewed: 08/30/2013 Elsevier  Interactive Patient Education  2017 [employer].

## 2017-04-24 NOTE — Progress Notes (Signed)
Discharge paperwork reviewed with patient and patient's husband through use of an interpretor. Prescription given. Questions answered. Patient is ready for discharge.

## 2017-04-24 NOTE — Progress Notes (Signed)
CM notified [facility] rep, Jermaine who states [facility] services and tube feedings in place an can discharge pt; CM relayed message to unit Rn. No other CM needs were communicated.

## 2017-04-25 ENCOUNTER — Ambulatory Visit: Payer: Medicaid Other | Admitting: Hematology

## 2017-04-25 LAB — GLUCOSE, CAPILLARY: Glucose-Capillary: 98 mg/dL (ref 65–99)

## 2017-04-26 ENCOUNTER — Encounter: Payer: Self-pay | Admitting: *Deleted

## 2017-04-27 ENCOUNTER — Ambulatory Visit (HOSPITAL_BASED_OUTPATIENT_CLINIC_OR_DEPARTMENT_OTHER): Payer: Medicaid Other | Admitting: Nurse Practitioner

## 2017-04-27 ENCOUNTER — Ambulatory Visit (HOSPITAL_COMMUNITY): Payer: Medicaid Other

## 2017-04-27 ENCOUNTER — Encounter (HOSPITAL_COMMUNITY): Payer: Self-pay

## 2017-04-27 ENCOUNTER — Encounter: Payer: Self-pay | Admitting: *Deleted

## 2017-04-27 ENCOUNTER — Other Ambulatory Visit: Payer: Medicaid Other

## 2017-04-27 ENCOUNTER — Other Ambulatory Visit: Payer: Self-pay | Admitting: *Deleted

## 2017-04-27 ENCOUNTER — Other Ambulatory Visit (HOSPITAL_BASED_OUTPATIENT_CLINIC_OR_DEPARTMENT_OTHER): Payer: Medicaid Other

## 2017-04-27 ENCOUNTER — Ambulatory Visit: Payer: Medicaid Other | Admitting: Nutrition

## 2017-04-27 ENCOUNTER — Ambulatory Visit (HOSPITAL_BASED_OUTPATIENT_CLINIC_OR_DEPARTMENT_OTHER): Payer: Medicaid Other

## 2017-04-27 VITALS — BP 96/68 | HR 88 | Temp 97.9°F | Resp 18 | Ht 64.0 in | Wt 86.3 lb

## 2017-04-27 DIAGNOSIS — C169 Malignant neoplasm of stomach, unspecified: Secondary | ICD-10-CM

## 2017-04-27 DIAGNOSIS — C786 Secondary malignant neoplasm of retroperitoneum and peritoneum: Secondary | ICD-10-CM

## 2017-04-27 DIAGNOSIS — C163 Malignant neoplasm of pyloric antrum: Secondary | ICD-10-CM

## 2017-04-27 DIAGNOSIS — C162 Malignant neoplasm of body of stomach: Secondary | ICD-10-CM

## 2017-04-27 DIAGNOSIS — Z95828 Presence of other vascular implants and grafts: Secondary | ICD-10-CM

## 2017-04-27 LAB — COMPREHENSIVE METABOLIC PANEL
ALBUMIN: 3.3 g/dL — AB (ref 3.5–5.0)
ALK PHOS: 39 U/L — AB (ref 40–150)
ALT: 35 U/L (ref 0–55)
ANION GAP: 9 meq/L (ref 3–11)
AST: 29 U/L (ref 5–34)
BUN: 11.2 mg/dL (ref 7.0–26.0)
CALCIUM: 9.1 mg/dL (ref 8.4–10.4)
CHLORIDE: 101 meq/L (ref 98–109)
CO2: 26 mEq/L (ref 22–29)
CREATININE: 0.6 mg/dL (ref 0.6–1.1)
EGFR: >90 mL/min/{1.73_m2} (ref >90)
Glucose: 116 mg/dl (ref 70–140)
POTASSIUM: 4.4 meq/L (ref 3.5–5.1)
Sodium: 137 mEq/L (ref 136–145)
Total Bilirubin: 0.34 mg/dL (ref 0.20–1.20)
Total Protein: 6.9 g/dL (ref 6.4–8.3)

## 2017-04-27 LAB — CEA (IN HOUSE-CHCC)

## 2017-04-27 LAB — CBC WITH DIFFERENTIAL/PLATELET
BASO%: 0.3 % (ref 0.0–2.0)
BASOS ABS: 0 10*3/uL (ref 0.0–0.1)
EOS ABS: 0 10*3/uL (ref 0.0–0.5)
EOS%: 0.3 % (ref 0.0–7.0)
HEMATOCRIT: 39.7 % (ref 34.8–46.6)
HGB: 13.2 g/dL (ref 11.6–15.9)
LYMPH#: 0.7 10*3/uL — AB (ref 0.9–3.3)
LYMPH%: 8.2 % — ABNORMAL LOW (ref 14.0–49.7)
MCH: 29.1 pg (ref 25.1–34.0)
MCHC: 33.3 g/dL (ref 31.5–36.0)
MCV: 87.5 fL (ref 79.5–101.0)
MONO#: 0.6 10*3/uL (ref 0.1–0.9)
MONO%: 6.9 % (ref 0.0–14.0)
NEUT#: 7.1 10*3/uL — ABNORMAL HIGH (ref 1.5–6.5)
NEUT%: 84.3 % — AB (ref 38.4–76.8)
PLATELETS: 274 10*3/uL (ref 145–400)
RBC: 4.54 10*6/uL (ref 3.70–5.45)
RDW: 13.9 % (ref 11.2–14.5)
WBC: 8.4 10*3/uL (ref 3.9–10.3)

## 2017-04-27 MED ORDER — HEPARIN SOD (PORK) LOCK FLUSH 100 UNIT/ML IV SOLN
500.0000 [IU] | Freq: Once | INTRAVENOUS | Status: AC
  Administered 2017-04-27: 500 [IU]
  Filled 2017-04-27: qty 5

## 2017-04-27 MED ORDER — LIDOCAINE-PRILOCAINE 2.5-2.5 % EX CREA: TOPICAL_CREAM | Freq: Once | CUTANEOUS | Status: DC

## 2017-04-27 MED ORDER — LIDOCAINE-PRILOCAINE 2.5-2.5 % EX CREA: TOPICAL_CREAM | CUTANEOUS | 2 refills | Status: DC

## 2017-04-27 MED ORDER — SODIUM CHLORIDE 0.9% FLUSH
10.0000 mL | Freq: Once | INTRAVENOUS | Status: AC
  Administered 2017-04-27: 10 mL
  Filled 2017-04-27: qty 10

## 2017-04-27 NOTE — Progress Notes (Signed)
[facility] OFFICE PROGRESS NOTE   Diagnosis:  Gastric cancer  INTERVAL HISTORY:   Claire Bray returns as scheduled. She was hospitalized 04/19/2017 through 04/24/2017 with abdominal pain, nausea and malnutrition/poor oral intake secondary to gastric outlet obstruction.She underwent placement of a J-tube as well as a Port-A-Cath. Intraoperatively she was found to have multiple small nodules in the omentum, a 6 mm nodule in the right abdomen and multiple very small implants along the low anterior abdominal wall. Omental biopsy was positive for metastatic carcinoma with focal signet-ring ring cell differentiation.  She reports pain surrounding the J-tube site. The pain increases when she tries to increase the rate of the tube feeds. She is taking hydrocodone liquid as needed. She notes improvement in the pain after a dose. She is on a full liquid diet and reports to be tolerating this without difficulty. She denies significant nausea. She had a bowel movement while in the office today. Prior to that she had not had a bowel movement for 4 days.  Objective:  Vital signs in last 24 hours:  Blood pressure 96/68, pulse 88, temperature 97.9 F (36.6 C), temperature source Oral, resp. rate 18, height _0  (1.626 m), weight 86 lb 4.8 oz (39.1 kg), last menstrual period 04/01/2017, SpO2 100 %.    HEENT: No thrush or ulcers. Resp: Lungs clear bilaterally. Cardio: Regular rate and rhythm. GI: Midline surgical incision with steri strips. J-tube left abdomen. Marked tenderness just inferior to the J-tube site. No significant erythema. Vascular: No leg edema. Neuro: Alert and oriented.  Port-A-Cath without erythema.  Lab Results:  Lab Results  Component Value Date   WBC 8.4 04/27/2017   HGB 13.2 04/27/2017   HCT 39.7 04/27/2017   MCV 87.5 04/27/2017   PLT 274 04/27/2017   NEUTROABS 7.1 (H) 04/27/2017    Imaging:  No results found.  Medications: I have reviewed the patient's  current medications.  Assessment/Plan: 1. 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 Severe calorie and protein malnutrition.Tube feedings and full liquid diet. 3. Abdominal pain, nausea and vomiting. She is no longer having significant nausea. 4. Anorexia and weight loss 5. J-tube placement 04/20/2017 6. Port-A-Cath placement 04/20/2017   Disposition: Claire Bray has gastric cancer with peritoneal metastases. The plan is to begin FOLFOX chemotherapy with cycle 1 scheduled 05/10/2017. She has attended the chemotherapy education class. We again reviewed potential toxicities at today's visit and her questions were answered. She is agreeable to proceed. A prescription was sent to her pharmacy for emla cream. She has antiemetics to take as needed.  She met with the [facility] dietitian today. Tube feedings are not at goal rate. She is having difficulty increasing the rate due to pain at the J-tube site. We are contacting Dr. Marlowe Aschoff office to schedule an appointment in the acute clinic on 04/28/2017 to evaluate the J-tube. She has liquid hydrocodone to take for the pain and will continue this as needed.  She has had recent constipation but had a bowel movement while in the office today. We discussed beginning a laxative regimen.  She is scheduled to return for follow-up visit and cycle 1 FOLFOX on 05/10/2017. She will contact the office in the interim with any problems.  An interpreter was present throughout today's visit.  25 minutes were spent face-to-face at today's visit with the majority of that time involved in counseling/coordination of care.    Ned Card ANP/GNP-BC   04/27/2017  3:44 PM

## 2017-04-27 NOTE — Progress Notes (Signed)
I met with patient and her husband with interpreter regarding patient's diagnosis of Gastric adenocarcinoma with peritoneal metastasis.   She is a patient of Dr. Burr Medico.  There is no pertinent past medical history  Medications include Reglan, Zofran, protonix, Phenergan.  Labs include glucose 106.  Height: 64 inches. Weight: 87.2 pounds today, June 20. Usual body weight: 110-115 pounds per patient in November 2017. BMI: 14.97.  Patient is status post jejunostomy feeding tube on June 13.   Patient was tolerating Osmolite 1.2 at 55 mL an hour during hospitalization. Since patient has been home, she is only tolerating Osmolite 1.2 at 25 mL an hour for 6 hours twice a day. She reports pain at site of j-tube when she increases feeding up to 30 mL an hour. She denies nausea and vomiting. She has not had a bowel movement since Saturday and is not taking anything for constipation. She is eating a soft diet which is low fiber. Labs are pending.  Estimated nutrition needs: 1500-1700 calories, 60-80 grams protein, 1.7 L fluid.  Nutrition diagnosis:  Severe malnutrition related to gastric cancer as evidenced by severe depletion of muscle mass and fat loss as well as less than 75% of energy intake for greater than one month and 24% weight loss or 7 months.  Intervention: Educated patient on soft diet which is low fiber along with ensure enlive oral nutrition supplements. I encouraged small frequent meals and snacks as tolerated. Recommended patient try increasing  Osmolite 1.2 to 35 mL an hour for 12 hours daily. Patient will require Osmolite 1.2 at 110 mL an hour for 12 hours if she is unable to eat. This will provide 1584 cal, 73 g protein, 1082 mL free water  Will ask to have tube evaluated secondary to pain. Recommended bowel regimen. Questions were answered.  Teach back method used.  Contact information provided.  Monitoring, evaluation, goals:  Patient will tolerate oral diet plus tube  feeding advancement to meet 100% of estimated nutrition needs.   Will monitor for refeeding syndrome.  Next visit: To be scheduled.    **Disclaimer: This note was dictated with voice recognition software. Similar sounding words can inadvertently be transcribed and this note may contain transcription errors which may not have been corrected upon publication of note.**

## 2017-04-29 ENCOUNTER — Emergency Department (HOSPITAL_COMMUNITY)
Admission: EM | Admit: 2017-04-29 | Discharge: 2017-04-29 | Disposition: A | Discharge status: Home / Self Care | Payer: Medicaid Other | Attending: Emergency Medicine | Admitting: Emergency Medicine

## 2017-04-29 ENCOUNTER — Encounter (HOSPITAL_COMMUNITY): Payer: Self-pay | Admitting: *Deleted

## 2017-04-29 DIAGNOSIS — Z79899 Other long term (current) drug therapy: Secondary | ICD-10-CM | POA: Diagnosis not present

## 2017-04-29 DIAGNOSIS — K5900 Constipation, unspecified: Secondary | ICD-10-CM | POA: Diagnosis not present

## 2017-04-29 DIAGNOSIS — K9423 Gastrostomy malfunction: Secondary | ICD-10-CM | POA: Insufficient documentation

## 2017-04-29 DIAGNOSIS — Z85028 Personal history of other malignant neoplasm of stomach: Secondary | ICD-10-CM | POA: Insufficient documentation

## 2017-04-29 DIAGNOSIS — K9419 Other complications of enterostomy: Secondary | ICD-10-CM

## 2017-04-29 DIAGNOSIS — R1032 Left lower quadrant pain: Secondary | ICD-10-CM | POA: Insufficient documentation

## 2017-04-29 LAB — I-STAT BETA HCG BLOOD, ED (MC, WL, AP ONLY)

## 2017-04-29 LAB — URINALYSIS, ROUTINE W REFLEX MICROSCOPIC
BILIRUBIN URINE: NEGATIVE
GLUCOSE, UA: NEGATIVE mg/dL
HGB URINE DIPSTICK: NEGATIVE
Ketones, ur: NEGATIVE mg/dL
NITRITE: NEGATIVE
PROTEIN: NEGATIVE mg/dL
SPECIFIC GRAVITY, URINE: 1.017 (ref 1.005–1.030)
pH: 7 (ref 5.0–8.0)

## 2017-04-29 LAB — COMPREHENSIVE METABOLIC PANEL
ALBUMIN: 3.1 g/dL — AB (ref 3.5–5.0)
ALT: 54 U/L (ref 14–54)
ANION GAP: 6 (ref 5–15)
AST: 48 U/L — ABNORMAL HIGH (ref 15–41)
Alkaline Phosphatase: 34 U/L — ABNORMAL LOW (ref 38–126)
BUN: 7 mg/dL (ref 6–20)
CO2: 28 mmol/L (ref 22–32)
Calcium: 8.7 mg/dL — ABNORMAL LOW (ref 8.9–10.3)
Chloride: 100 mmol/L — ABNORMAL LOW (ref 101–111)
Creatinine, Ser: 0.5 mg/dL (ref 0.44–1.00)
GFR calc non Af Amer: >60 mL/min (ref >60)
GLUCOSE: 96 mg/dL (ref 65–99)
POTASSIUM: 4 mmol/L (ref 3.5–5.1)
SODIUM: 134 mmol/L — AB (ref 135–145)
Total Bilirubin: 0.3 mg/dL (ref 0.3–1.2)
Total Protein: 6.5 g/dL (ref 6.5–8.1)

## 2017-04-29 LAB — CBC
HCT: 35.6 % — ABNORMAL LOW (ref 36.0–46.0)
Hemoglobin: 11.6 g/dL — ABNORMAL LOW (ref 12.0–15.0)
MCH: 28.4 pg (ref 26.0–34.0)
MCHC: 32.6 g/dL (ref 30.0–36.0)
MCV: 87.3 fL (ref 78.0–100.0)
PLATELETS: 308 10*3/uL (ref 150–400)
RBC: 4.08 MIL/uL (ref 3.87–5.11)
RDW: 13.5 % (ref 11.5–15.5)
WBC: 5.4 10*3/uL (ref 4.0–10.5)

## 2017-04-29 LAB — I-STAT CHEM 8, ED
BUN: 8 mg/dL (ref 6–20)
CALCIUM ION: 1.12 mmol/L — AB (ref 1.15–1.40)
CHLORIDE: 98 mmol/L — AB (ref 101–111)
Creatinine, Ser: 0.5 mg/dL (ref 0.44–1.00)
Glucose, Bld: 90 mg/dL (ref 65–99)
HCT: 36 % (ref 36.0–46.0)
HEMOGLOBIN: 12.2 g/dL (ref 12.0–15.0)
Potassium: 4 mmol/L (ref 3.5–5.1)
SODIUM: 136 mmol/L (ref 135–145)
TCO2: 28 mmol/L (ref 0–100)

## 2017-04-29 MED ORDER — POLYETHYLENE GLYCOL 3350 17 G PO PACK: PACK | ORAL | 1 refills | Status: DC

## 2017-04-29 NOTE — ED Triage Notes (Signed)
Pt had gastric tube placed a week ago for tx r/t stomach ca.  States intermittent pain r/t g-tube placement.  Pain does not increase with tube feedings.

## 2017-04-29 NOTE — Progress Notes (Signed)
  Conducted via [place] interpreter Subjective/Chief Complaint: Pt came to ED with c/o generalized discomfort around J tube. No n/v. Last BM wed. Getting J tube feeds. Some mild discomfort during J tube feeds.   Reports she is tolerating water/liquids by mouth  Objective: Vital signs in last 24 hours: Temp:  [98.1 F (36.7 C)] 98.1 F (36.7 C) (06/22 1445) Pulse Rate:  [65-77] 70 (06/22 2100) Resp:  [14-18] 14 (06/22 1701) BP: (100-114)/(74-85) 114/82 (06/22 2100) SpO2:  [98 %-100 %] 98 % (06/22 2100) Weight:  [39 kg (86 lb)-39.9 kg (87 lb 14.4 oz)] 39.9 kg (87 lb 14.4 oz) (06/22 1459)    Intake/Output from previous day: No intake/output data recorded. Intake/Output this shift: No intake/output data recorded.  Alert, nontoxic Resting comfortably cta b/l Reg Soft, palpable mass, incision c/d/i; J tube looks great. No cellulitis. Secure.  No edema  Lab Results:   Recent Labs  04/27/17 1404 04/29/17 1959 04/29/17 2002  WBC 8.4 5.4  --   HGB 13.2 11.6* 12.2  HCT 39.7 35.6* 36.0  PLT 274 308  --    BMET  Recent Labs  04/27/17 1404 04/29/17 1959 04/29/17 2002  NA 137 134* 136  K 4.4 4.0 4.0  CL  --  100* 98*  CO2 26 28  --   GLUCOSE 116 96 90  BUN 11.2 7 8   CREATININE 0.6 0.50 0.50  CALCIUM 9.1 8.7*  --    PT/INR No results for input(s): LABPROT, INR in the last 72 hours. ABG No results for input(s): PHART, HCO3 in the last 72 hours.  Invalid input(s): PCO2, PO2  Studies/Results: No results found.  Anti-infectives: Anti-infectives    None      Assessment/Plan: Advanced gastric cancer with gastric outlet obstruction S/p port a cath, insertion of J tube 6/13 [place]  Labs ok. No sign of dislodgement, infection.   Gave reassurance via interpreter Discussed taking liquid stool softner or miralax daily to help prevent constipation If feels lots of distension/discomfort while J tube infusing, rec slowing down rate Discussed what to call for F/u as  previously scheduled Discussed with EDP  Leighton Ruff. Redmond Pulling, MD, FACS General, Bariatric, & Minimally Invasive Surgery [facility], [place]   LOS: 0 days    Gayland Curry 04/29/2017

## 2017-04-29 NOTE — ED Notes (Signed)
Virtual interpretor used at discharge.

## 2017-04-29 NOTE — Discharge Instructions (Signed)
Take miralax by mouth twice daily. If having abdominal pain, please slow tube feeds down. Please followup with surgery as scheduled. Please return for worsening abdominal pain, vomiting, or fevers

## 2017-04-29 NOTE — ED Notes (Signed)
ED Provider at bedside. 

## 2017-04-29 NOTE — ED Provider Notes (Signed)
[facility] DEPT Provider Note   CSN: [id number] Arrival date & time: 04/29/17  1424      History   Chief Complaint Chief Complaint  Patient presents with  . Abdominal Pain    recent g-tube placement    HPI Claire Bray is a 41 y.o. female.  The history is provided by the patient and medical records. No language interpreter was used.  Abdominal Pain   This is a new problem. The current episode started 2 days ago. The problem occurs hourly. The problem has not changed since onset.The pain is located in the LLQ. The pain is moderate. Pertinent negatives include fever, vomiting, dysuria, hematuria and arthralgias. Nothing aggravates the symptoms. Nothing relieves the symptoms. Past workup includes surgery. Past workup comments: Recent resection of omental for metastatic dz with J tube placement.    Past Medical History:  Diagnosis Date  . Cancer (HCC)    STOMACH  . No pertinent past medical history     Patient Active Problem List   Diagnosis Date Noted  . Port catheter in place 04/27/2017  . Protein-calorie malnutrition, severe 04/20/2017  . Gastric cancer ([place]) 04/18/2017  . Helicobacter pylori gastritis 01/12/2017    Past Surgical History:  Procedure Laterality Date  . GASTROSTOMY N/A 04/20/2017   Procedure: INSERTION OF J TUBE;  Surgeon: Stark Klein, MD;  Location: [facility];  Service: General;  Laterality: N/A;  . NO PAST SURGERIES    . PORTACATH PLACEMENT Left 04/20/2017   Procedure: INSERTION PORT-A-CATH;  Surgeon: Stark Klein, MD;  Location: [facility];  Service: General;  Laterality: Left;    OB History    Gravida Para Term Preterm AB Living   2 2 2  0 0 1   SAB TAB Ectopic Multiple Live Births   0 0 0   1       Home Medications    Prior to Admission medications   Medication Sig Start Date End Date Taking? Authorizing Provider  dicyclomine (BENTYL) 20 MG tablet Take 1 tablet (20 mg total) by mouth 2 (two) times daily. Patient not taking: Reported on  04/18/2017 04/02/17   Montine Circle, PA-C  HYDROcodone-acetaminophen (HYCET) 7.5-325 mg/15 ml solution Take 10 mLs by mouth every 4 (four) hours as needed for moderate pain or severe pain. 04/24/17   Stark Klein, MD  ibuprofen (ADVIL,MOTRIN) 600 MG tablet Take 1 tablet (600 mg total) by mouth every 6 (six) hours. Patient not taking: Reported on 04/18/2017 12/14/12   Nolon Rod, DO  lidocaine-prilocaine (EMLA) cream Apply to portacath site 1 hour prior to use 04/27/17   Owens Shark, NP  metoCLOPramide (REGLAN) 5 MG tablet Take 2 tablets (10 mg total) by mouth 4 (four) times daily -  before meals and at bedtime. 04/06/17   Marcille Buffy D, CNM  Nutritional Supplements (FEEDING SUPPLEMENT, OSMOLITE 1.2 CAL,) LIQD Place 1,000 mLs into feeding tube continuous. 04/24/17   Stark Klein, MD  ondansetron (ZOFRAN) 4 MG tablet Take 1-2 tablets (4-8 mg total) by mouth every 8 (eight) hours as needed for nausea. 04/24/17   Stark Klein, MD  ondansetron (ZOFRAN) 8 MG tablet Take 1 tablet (8 mg total) by mouth every 8 (eight) hours as needed for nausea or vomiting. Patient not taking: Reported on 04/18/2017 04/17/17   Varney Biles, MD  pantoprazole (PROTONIX) 40 MG tablet Take 1 tablet (40 mg total) by mouth daily at 12 noon. 04/24/17   Stark Klein, MD  polyethylene glycol [facility]) packet Take half  a pack (8.5 oz) every 12 hours. Adjust as needed for soft stools 04/29/17   Augustus Zurawski, Pilar Plate, MD  promethazine (PHENERGAN) 25 MG suppository Place 1 suppository (25 mg total) rectally every 6 (six) hours as needed for nausea. Patient not taking: Reported on 04/18/2017 04/17/17   Varney Biles, MD  zolpidem (AMBIEN) 5 MG tablet Take 1 tablet (5 mg total) by mouth at bedtime as needed for sleep. Patient not taking: Reported on 04/18/2017 12/11/12   Martha Clan, MD    Family History Family History  Problem Relation Age of Onset  . Hypertension Mother   . Heart disease Mother   . Other Neg Hx     Social  History Social History  Substance Use Topics  . Smoking status: Never Smoker  . Smokeless tobacco: Never Used  . Alcohol use No     Allergies   Patient has no known allergies.   Review of Systems Review of Systems  Constitutional: Negative for chills and fever.  HENT: Negative for ear pain and sore throat.   Eyes: Negative for pain and visual disturbance.  Respiratory: Negative for cough and shortness of breath.   Cardiovascular: Negative for chest pain and palpitations.  Gastrointestinal: Positive for abdominal pain. Negative for vomiting.  Genitourinary: Negative for dysuria and hematuria.  Musculoskeletal: Negative for arthralgias and back pain.  Skin: Negative for color change and rash.  Neurological: Negative for seizures and syncope.  All other systems reviewed and are negative.    Physical Exam Updated Vital Signs BP 112/82 (BP Location: Right Arm)   Pulse 71   Temp 98.1 F (36.7 C) (Oral)   Resp 16   Ht 5\' 4"  (1.626 m)   Wt 39.9 kg (87 lb 14.4 oz)   LMP 04/01/2017 (Exact Date)   SpO2 100%   BMI 15.09 kg/m   Physical Exam  Constitutional: She appears well-developed. No distress.  Thin female  HENT:  Head: Normocephalic and atraumatic.  Eyes: Conjunctivae are normal.  Neck: Neck supple.  Cardiovascular: Normal rate and regular rhythm.   No murmur heard. Pulmonary/Chest: Effort normal and breath sounds normal. No respiratory distress.  Abdominal: Soft. There is no tenderness.  J tube in place LLQ abdomen. Healing midline abdominal surgical scar  Musculoskeletal: She exhibits no edema.  Neurological: She is alert. No cranial nerve deficit. Coordination normal.  Moves all extremities  Skin: Skin is warm and dry.  Nursing note and vitals reviewed.    ED Treatments / Results  Labs (all labs ordered are listed, but only abnormal results are displayed) Labs Reviewed  CBC - Abnormal; Notable for the following:       Result Value   Hemoglobin 11.6 (*)     HCT 35.6 (*)    All other components within normal limits  COMPREHENSIVE METABOLIC PANEL - Abnormal; Notable for the following:    Sodium 134 (*)    Chloride 100 (*)    Calcium 8.7 (*)    Albumin 3.1 (*)    AST 48 (*)    Alkaline Phosphatase 34 (*)    All other components within normal limits  URINALYSIS, ROUTINE W REFLEX MICROSCOPIC - Abnormal; Notable for the following:    APPearance CLOUDY (*)    Leukocytes, UA TRACE (*)    Bacteria, UA RARE (*)    Squamous Epithelial / LPF 0-5 (*)    All other components within normal limits  I-STAT CHEM 8, ED - Abnormal; Notable for the following:    Chloride 98 (*)  Calcium, Ion 1.12 (*)    All other components within normal limits  I-STAT BETA HCG BLOOD, ED ([facility], [facility], [facility] ONLY)    EKG  EKG Interpretation None       Radiology No results found.  Procedures Procedures (including critical care time)  Medications Ordered in ED Medications - No data to display   Initial Impression / Assessment and Plan / ED Course  I have reviewed the triage vital signs and the nursing notes.  Pertinent labs & imaging results that were available during my care of the patient were reviewed by me and considered in my medical decision making (see chart for details).     41 year old [place] speaking female history of metastatic gastric cancer S/P resection and G-tube placement 9 days ago who presents with pain around LLQ J-tube site.  She reports intermittent pain around J-tube site over past week. Endorses constipation. She denies any association with tube feeds, urination, p.o. intake, or palpation.  She states tube feeds occur twice daily without incident.  She is also tolerating p.o.  She denies any fevers, drainage, leakage, chest pain, shortness breath, dysuria, vaginal discharge, or rash.  Afebrile, VSS.  Lungs clear to auscultation bilaterally.  Localized edema around G-tube site.  Wound otherwise c/d/i.  Patient also with healing midline  abdominal surgical scar.  CBC, BMP unremarkable.  Spoke with surgery given recency of operation.  No current signs of dislodgment or infection.  Patient instructed on taking MiraLAX.  She will also slow to feeds for distention or discomfort.  She will follow-up with surgery as scheduled.  Return precautions provided for worsening symptoms. Pt will f/u with PCP at first availability. Pt verbalized agreement with plan.  Pt care d/w Dr. Jeneen Rinks  Final Clinical Impressions(s) / ED Diagnoses   Final diagnoses:  Jejunostomy tube site pain [facility])  Constipation, unspecified constipation type    New Prescriptions Discharge Medication List as of 04/29/2017  9:22 PM    START taking these medications   Details  polyethylene glycol (MIRALAX) packet Take half a pack (8.5 oz) every 12 hours. Adjust as needed for soft stools, Print         Payton Emerald, MD 04/30/17 0345    Tanna Furry, MD 05/12/17 [phone]

## 2017-05-07 DIAGNOSIS — C786 Secondary malignant neoplasm of retroperitoneum and peritoneum: Secondary | ICD-10-CM | POA: Insufficient documentation

## 2017-05-07 DIAGNOSIS — C801 Malignant (primary) neoplasm, unspecified: Secondary | ICD-10-CM

## 2017-05-09 ENCOUNTER — Encounter (HOSPITAL_COMMUNITY): Payer: Self-pay

## 2017-05-09 ENCOUNTER — Encounter: Payer: Self-pay | Admitting: Pharmacist

## 2017-05-09 NOTE — Progress Notes (Signed)
[place]  [phone] [phone]) [phone]  Clinic Follow up Note   Patient Care Team: Patient, No Pcp Per as PCP - General (General Practice) Patient, No Pcp Per (General Practice) Arta Silence, MD as Consulting Physician (Gastroenterology) Truitt Merle, MD as Consulting Physician (Hematology) Stark Klein, MD as Consulting Physician (General Surgery) 05/10/2017  CHIEF COMPLAINTS:  Adenocarcinoma of the stomach    Gastric cancer ([place])   04/12/2017 Procedure    Endoscopy by Dr. Paulita Fujita 04/12/17 A large, fungating, infiltrative and ulcerated, non-circumferential mass with no bleeding was found in the prepyloric region of the stomach, extending to the pylorus distally and proximally along the lesser cure of the antrum and body. Mass more ulcer in appearance with heaped-up margins. Tight pinhole pyloric channel, unable to transverse this area with the endoscope.      04/12/2017 Initial Biopsy    FINAL MICROSCOPIC DIAGNOSIS 04/12/17 Stomach, Bx: ADENOCARCINOMA, HIGH GRADE, SIGNET RING CELL SUBTYPE, INVASIVE      04/18/2017 Initial Diagnosis    Gastric cancer ([place])     04/20/2017 Pathology Results    Diagnosis Omentum, resection for tumor - METASTATIC CARCINOMA WITH FOCAL SIGNET RING CELL DIFFERENTIATION. - ONE BENIGN LYMPH NODE (0/1). - SEE COMMENT. Microscopic Comment Given the clinical history as well as the histologic findings, the features are consistent with metastatic gastric carcinoma. Additional studies can be performed upon clinician request. (JBK:ecj 04/21/2017)       HISTORY OF PRESENTING ILLNESS (04/18/2017):  Claire Bray 41 y.o. female is here because of a new diagnosis of adenocarcinoma of the stomach.  The patient presented to the ED on 04/02/17 complaining of right lower abdominal pain that has been intermittent since January 2018. The pain worsened with an onset of emesis and diarrhea. The patient also had weight loss of approximately  25 lbs. Workup CT ABD/PEL showed significant gastroparesis, diffuse bowel dysmotility, and the small bowel was largely distended with fluid and air.  The patient presented to Deliah Goody, PA of [facility] on 04/11/17. By then,. On abdominal exam, a mass was palpated in the right middle and bilateral lower abdomen - questionable stool in the colon/instestines. The patient underwent an endoscopy by Dr. Paulita Fujita on 04/12/17. This revealed a large, fungating, infiltrative mass in the prepyloric region of the stomach extending to the pylorus distally and proximally along the lesser curve of the antrum and body. Biopsies were obtained. This revealed adenocarcinoma, high grade, signet ring cell subtype, invasive. Tumor marker testing is pending.  The patient has been referred today to discuss systemic treatment for the management of her disease. Her husband and two young children were present during the encounter. We had [employer] video interpreter during this encounter who spoke [place].  She is only able to drink liquids; such as milk or soups. She has emesis when she eat solids. She is supplementing with Boost/Ensure with 1.5 - 2 a day. She reports less emesis with Ensure. The nausea is better is she does not eat anything. She reports her stool is normal in color. The patient reports she lost 25 lbs in the last 6 months. She continues to have abdominal pain.  The patient reports she is not using anything for contraception at this time. She used Depo-Provera in the past.  CURRENT THERAPY: first line chemo [id number], every 2 weeks   INTERIM HISTORY: Today, she presents to the clinic accompanied by an interpreter and husband for follow up and chemotherpay. Since she left the hospital, she  has been doing fine. She has occasional pain, discharge and smell around the feeding tube incision.  She has nausea some days at night. She denies vomiting. She only eats liquid and crackers sometimes. She has no other  concerns.     MEDICAL HISTORY:  Past Medical History:  Diagnosis Date  . Cancer (HCC)    STOMACH  . No pertinent past medical history     SURGICAL HISTORY: Past Surgical History:  Procedure Laterality Date  . GASTROSTOMY N/A 04/20/2017   Procedure: INSERTION OF J TUBE;  Surgeon: Stark Klein, MD;  Location: [place];  Service: General;  Laterality: N/A;  . NO PAST SURGERIES    . PORTACATH PLACEMENT Left 04/20/2017   Procedure: INSERTION PORT-A-CATH;  Surgeon: Stark Klein, MD;  Location: [place];  Service: General;  Laterality: Left;    SOCIAL HISTORY: Social History   Social History  . Marital status: Married    Spouse name: N/A  . Number of children: N/A  . Years of education: N/A   Occupational History  . Not on file.   Social History Main Topics  . Smoking status: Never Smoker  . Smokeless tobacco: Never Used  . Alcohol use No  . Drug use: No  . Sexual activity: Not Currently   Other Topics Concern  . Not on file   Social History Narrative   ** Merged History Encounter **       The patient has 5 siblings. She is the oldest. She is married with 2 children. She has an 25 yo son and a 24 yo daughter. 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 Her husband works for [employer].  FAMILY HISTORY: Family History  Problem Relation Age of Onset  . Hypertension Mother   . Heart disease Mother   . Other Neg Hx     ALLERGIES:  has No Known Allergies.  MEDICATIONS:  Current Outpatient Prescriptions  Medication Sig Dispense Refill  . dicyclomine (BENTYL) 20 MG tablet Take 1 tablet (20 mg total) by mouth 2 (two) times daily. 20 tablet 0  . HYDROcodone-acetaminophen (HYCET) 7.5-325 mg/15 ml solution Take 10 mLs by mouth every 4 (four) hours as needed for moderate pain or severe pain. 120 mL 0  . lidocaine-prilocaine (EMLA) cream Apply to portacath site 1 hour prior to use 30 g 2  . metoCLOPramide (REGLAN) 5 MG tablet Take 2 tablets (10 mg total) by mouth 4 (four)  times daily -  before meals and at bedtime. 120 tablet 1  . Nutritional Supplements (FEEDING SUPPLEMENT, OSMOLITE 1.2 CAL,) LIQD Place 1,000 mLs into feeding tube continuous. 40000 mL 11  . pantoprazole (PROTONIX) 40 MG tablet Take 1 tablet (40 mg total) by mouth daily at 12 noon. 30 tablet 3  . polyethylene glycol (MIRALAX) packet Take half a pack (8.5 oz) every 12 hours. Adjust as needed for soft stools 30 each 1  . ibuprofen (ADVIL,MOTRIN) 600 MG tablet Take 1 tablet (600 mg total) by mouth every 6 (six) hours. (Patient not taking: Reported on 04/18/2017) 60 tablet 0  . ondansetron (ZOFRAN) 4 MG tablet Take 1-2 tablets (4-8 mg total) by mouth every 8 (eight) hours as needed for nausea. (Patient not taking: Reported on 05/10/2017) 20 tablet 0  . ondansetron (ZOFRAN) 8 MG tablet Take 1 tablet (8 mg total) by mouth every 8 (eight) hours as needed for nausea or vomiting. (Patient not taking: Reported on 04/18/2017) 30 tablet 0  . promethazine (PHENERGAN)  25 MG suppository Place 1 suppository (25 mg total) rectally every 6 (six) hours as needed for nausea. (Patient not taking: Reported on 04/18/2017) 12 each 0  . zolpidem (AMBIEN) 5 MG tablet Take 1 tablet (5 mg total) by mouth at bedtime as needed for sleep. (Patient not taking: Reported on 04/18/2017) 5 tablet 0   No current facility-administered medications for this visit.     REVIEW OF SYSTEMS:   Constitutional: Denies fevers, chills or abnormal night sweats (+) Weight gain (+)pain around incision site Eyes: Denies blurriness of vision, double vision or watery eyes Ears, nose, mouth, throat, and face: Denies mucositis or sore throat Respiratory: Denies cough, dyspnea or wheezes Cardiovascular: Denies palpitation, chest discomfort or lower extremity swelling Gastrointestinal:  Denies heartburn or change in bowel habits (+) Nausea Skin: Denies abnormal skin rashes Lymphatics: Denies new lymphadenopathy or easy bruising Neurological:Denies numbness,  tingling or new weaknesses Behavioral/Psych: Mood is stable, no new changes  All other systems were reviewed with the patient and are negative.  PHYSICAL EXAMINATION:  ECOG PERFORMANCE STATUS: 1 - Symptomatic but completely ambulatory  Vitals:   05/10/17 1017  BP: 91/71  Pulse: 72  Resp: 18  Temp: 98.1 F (36.7 C)   Filed Weights   05/10/17 1017  Weight: 92 lb 12.8 oz (42.1 kg)    GENERAL:alert, no distress and comfortable, cachectic SKIN: skin color, texture, turgor are normal, no rashes or significant lesions EYES: normal, conjunctiva are pink and non-injected, sclera clear OROPHARYNX:no exudate, no erythema and lips, buccal mucosa, and tongue normal  NECK: supple, thyroid normal size, non-tender, without nodularity LYMPH:  no palpable lymphadenopathy in the cervical, axillary or inguinal LUNGS: clear to auscultation and percussion with normal breathing effort HEART: regular rate & rhythm and no murmurs and no lower extremity edema ABDOMEN:abdomen soft and normal bowel sounds Musculoskeletal:no cyanosis of digits and no clubbing  PSYCH: alert & oriented x 3 with fluent speech NEURO: no focal motor/sensory deficits  LABORATORY DATA:  I have reviewed the data as listed CBC Latest Ref Rng & Units 05/10/2017 04/29/2017 04/29/2017  WBC 3.9 - 10.3 10e3/uL 5.3 - 5.4  Hemoglobin 11.6 - 15.9 g/dL 12.0 12.2 11.6(L)  Hematocrit 34.8 - 46.6 % 36.5 36.0 35.6(L)  Platelets 145 - 400 10e3/uL 256 - 308    CMP Latest Ref Rng & Units 05/10/2017 04/29/2017 04/29/2017  Glucose 70 - 140 mg/dl 101 90 96  BUN 7.0 - 26.0 mg/dL 11.2 8 7   Creatinine 0.6 - 1.1 mg/dL 0.6 0.50 0.50  Sodium 136 - 145 mEq/L 139 136 134(L)  Potassium 3.5 - 5.1 mEq/L 4.3 4.0 4.0  Chloride 101 - 111 mmol/L - 98(L) 100(L)  CO2 22 - 29 mEq/L 28 - 28  Calcium 8.4 - 10.4 mg/dL 8.9 - 8.7(L)  Total Protein 6.4 - 8.3 g/dL 6.7 - 6.5  Total Bilirubin 0.20 - 1.20 mg/dL <0.22 - 0.3  Alkaline Phos 40 - 150 U/L 47 - 34(L)  AST 5 -  34 U/L 30 - 48(H)  ALT 0 - 55 U/L 42 - 54    PATHOLOGY: Diagnosis 04/20/2017 Omentum, resection for tumor - METASTATIC CARCINOMA WITH FOCAL SIGNET RING CELL DIFFERENTIATION. - ONE BENIGN LYMPH NODE (0/1). - SEE COMMENT. Microscopic Comment Given the clinical history as well as the histologic findings, the features are consistent with metastatic gastric carcinoma. Additional studies can be performed upon clinician request. (JBK:ecj 04/21/2017)   FINAL MICROSCOPIC DIAGNOSIS 04/12/17 Stomach, Bx: ADENOCARCINOMA, HIGH GRADE, SIGNET RING CELL SUBTYPE, INVASIVE  HER-2 NEU TESTING WILL BE SENT TO [facility] AND THE RESULT WILL BE FOLLOWED AS AN ADDEDUM, CYTO-7, CDX-2, IRCV-89 AND HELICOBACTER IP STAINS PENDING This case was discussed with Dr. Paulita Fujita on 04/14/2017. PER DR. Paulita Fujita, THE LESION IS AN ULCERATED MASS, MALIGNANT APPEARANCE, CARCINOMA VS LYMHOMA, HE CALLED AND DISCUSSED THIS AT 04/13/2017 AND SPOKE WITH DR. Jacky Kindle M.D., Pathologyist (Case signed 04/14/2017)  PROCEDURES: Endoscopy by Dr. Paulita Fujita 04/12/17 The examined esophagus was normal. A large amount of food (residue) was found in the gastric fundus, in the gastric body, and in the gastric antrum. A large, fungating, infiltrative and ulcerated, non-circumferential mass with no bleeding was found in the prepyloric region of the stomach, extending to the pylorus distally and proximally along the lesser cure of the antrum and body. Mass more ulcer in appearance with heaped-up margins. Biopsies were taken with a cold forceps for histology. Tight pinhole pyloric channel, unable to transverse this area with the endoscope.   RADIOGRAPHIC STUDIES: I have personally reviewed the radiological images as listed and agreed with the findings in the report. Ct Chest W Contrast  Result Date: 04/20/2017 CLINICAL DATA:  Gastric cancer. Staging. Admitted for possible J tube placement due to poor oral intake. EXAM: CT CHEST WITH CONTRAST  TECHNIQUE: Multidetector CT imaging of the chest was performed during intravenous contrast administration. CONTRAST:  68m ISOVUE-300 IOPAMIDOL (ISOVUE-300) INJECTION 61% COMPARISON:  None. FINDINGS: Cardiovascular: Normal heart size. No significant pericardial fluid/thickening. Great vessels are normal in course and caliber. No central pulmonary emboli. Mediastinum/Nodes: Subcentimeter hypodense left thyroid lobe nodule. Unremarkable esophagus. No pathologically enlarged axillary, mediastinal or hilar lymph nodes. Lungs/Pleura: No pneumothorax. No pleural effusion. No acute consolidative airspace disease, lung masses or significant pulmonary nodules. Upper abdomen: Visualized proximal stomach is distended with fluid and food debris. No appreciable wall thickening or discrete mass in the visualized proximal stomach. Musculoskeletal:  No aggressive appearing focal osseous lesions. IMPRESSION: No evidence of metastatic disease in the chest. No active pulmonary disease. Visualized proximal stomach is distended with fluid and debris. Electronically Signed   By: JIlona SorrelM.D.   On: 04/20/2017 08:43   Dg Chest Port 1 View  Result Date: 04/20/2017 CLINICAL DATA:  Port-A-Cath placement, concomitant jejunostomy tube placement. EXAM: PORTABLE CHEST 1 VIEW COMPARISON:  04/19/2017 CT FINDINGS: The heart size and mediastinal contours are within normal limits. Port catheter tip is seen in the distal SVC. No pneumothorax is noted. Both lungs are clear. A gastric tube extends into the expected location of the stomach with scattered enteric contrast noted in the left upper quadrant. Free air beneath the right diaphragm may be explained by the same day jejunostomy tube placement but follow-up to ensure clearance is recommended. The visualized skeletal structures are unremarkable. IMPRESSION: 1. Satisfactory left subclavian port placement without pneumothorax. Tip at the distal SVC. 2. Chest tube extends into the expected  location of stomach. 3. Small amount of free air beneath the right diaphragm may be explained by recent jejunostomy tube placement. These results will be called to the ordering clinician or representative by the Radiologist Assistant, and communication documented in the PACS or zVision Dashboard. Electronically Signed   By: DAshley RoyaltyM.D.   On: 04/20/2017 15:10   Dg Fluoro Guide Cv Line-no Report  Result Date: 04/20/2017 Fluoroscopy was utilized by the requesting physician.  No radiographic interpretation.    ASSESSMENT & PLAN: 41y[place] who presented with abdominal pain, nausea, and emesis for 4-5 months and  significant weight loss   1. Gastric adenocarcinoma, prepylori area, high grade, signet ring cell subtype, with peritoneal metastasis -I reviewed her CT scan, endoscopy finding and the biopsy results with patient and her husband in great details, through a vedio interpreter.  -The patient had an endoscopy by Dr. Paulita Fujita on 04/12/17 revealing a mass in the prepyloric region of the stomach. Biopsy of the mass revealed high grade adenocarcinoma of stomach. 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 Biopsy confirmed metastasis. --I recommend palliative chemotherapy with first-line FOLFOX. Her tumor HER2 was negative.  -I reviewed her Foundation one genomic testing results, unfortunately she does not have any targetable mutations. PDL 1 test results to pending, her tumor tissue was not sufficient for MSI testing. I'll try to obtain her initial gastric mass biopsy tissue for the MSI testing.  -We discussed her that surgery is not indicated now due to her metastatic disease. However if she has great response to chemotherapy, I may refer her to Dr. Hartford Poli to see if she is a candidate for debulking and HIPEC --Chemotherapy consent: Side effects including but does not not limited to, fatigue, nausea, vomiting, diarrhea, hair loss, neuropathy,  fluid retention, renal and kidney dysfunction, neutropenic fever, needed for blood transfusion, bleeding, were discussed with patient in great detail. She agrees to proceed. -The goal of therapy is palliative, to prolong her life and preserve her quality of life. -We discussed infertility issue, she does not plan to have more children. I strongly recommend her husband to use condom for contraceptives and protection. They agree.  -She has gained some weight, clinically doing well, we'll start first cycle FOLFOX today. -She has received a J-Tube. I encouraged her to wash around it throughly and apply neosporin around it - I again discussed limitations on foods due to chemo along with side effects. She should not eat anything cold due to cold sensitivity. If she experiences more than what she anticipated, she knows to call - f/u and chemo in 2 weeks - repeat scan after 2-3 months of treatment - will continue treatment as long as it is effective and she is tolerating well  2. Abdominal pain, nausea and vomiting  -Secondary to her gastric cancer and pyloric outlet obstruction -She will continue omeprazole, and use Zofran, Reglan, and Phenergan as needed for nausea   3. Anorexia and weight loss, moderate calorie and protein malnutrition  -The patient has over a 25 lb weight loss in the past 6 months and she currently weighs 87 lbs. This is secondary of her disease -She has a poor appetite with her eating habits impacted by nausea, emesis, and abdominal pain. - patient now has a J-Tube and is on tube feeds, 4 cans a day, followed by home care service  -she has gained some weight lately   4. Genetics  -she does not have family history of malignancy. -Due to her young age, I'll ask our genetic counselor to see if she meets the criteria for genetic testing.  5. Anemia  -she has mild anemia, possible related to small bleeding from her gastric cancer and anemia in neoplastic disease, resolved now -will  monitor her iron study   6. Goal of care discussion  -We again discussed the incurable nature of her cancer, and the overall poor prognosis, especially if she does not have good response to chemotherapy or progress on chemo -The patient understands the goal of care is palliative. -I recommend DNR/DNI, she will think about it  PLAN - begin cycle 1 FOLFOX today - f/u and chemo in 2 weeks -I will try to get her initial gastric tumor biopsy tissue for MSI testing   No orders of the defined types were placed in this encounter.   All questions were answered. The patient knows to call the clinic with any problems, questions or concerns. I spent 30 minutes counseling the patient face to face, by using the medial interpreting service. The total time spent in the appointment was 40 minutes and more than 50% was on counseling.     Truitt Merle, MD 05/10/2017 11:19 PM  This document serves as a record of services personally performed by Truitt Merle, MD. It was created on her behalf by Brandt Loosen, a trained medical scribe. The creation of this record is based on the scribe's personal observations and the provider's statements to them. This document has been checked and approved by the attending provider.

## 2017-05-10 ENCOUNTER — Ambulatory Visit (HOSPITAL_BASED_OUTPATIENT_CLINIC_OR_DEPARTMENT_OTHER): Payer: Medicaid Other

## 2017-05-10 ENCOUNTER — Other Ambulatory Visit (HOSPITAL_BASED_OUTPATIENT_CLINIC_OR_DEPARTMENT_OTHER): Payer: Medicaid Other

## 2017-05-10 ENCOUNTER — Ambulatory Visit (HOSPITAL_BASED_OUTPATIENT_CLINIC_OR_DEPARTMENT_OTHER): Payer: Medicaid Other | Admitting: Hematology

## 2017-05-10 ENCOUNTER — Telehealth: Payer: Self-pay | Admitting: Hematology

## 2017-05-10 VITALS — BP 91/71 | HR 72 | Temp 98.1°F | Resp 18 | Ht 64.0 in | Wt 92.8 lb

## 2017-05-10 DIAGNOSIS — Z7189 Other specified counseling: Secondary | ICD-10-CM

## 2017-05-10 DIAGNOSIS — C163 Malignant neoplasm of pyloric antrum: Secondary | ICD-10-CM

## 2017-05-10 DIAGNOSIS — R634 Abnormal weight loss: Secondary | ICD-10-CM

## 2017-05-10 DIAGNOSIS — R109 Unspecified abdominal pain: Secondary | ICD-10-CM

## 2017-05-10 DIAGNOSIS — C786 Secondary malignant neoplasm of retroperitoneum and peritoneum: Secondary | ICD-10-CM

## 2017-05-10 DIAGNOSIS — Z5111 Encounter for antineoplastic chemotherapy: Secondary | ICD-10-CM

## 2017-05-10 DIAGNOSIS — R630 Anorexia: Secondary | ICD-10-CM | POA: Diagnosis not present

## 2017-05-10 DIAGNOSIS — C801 Malignant (primary) neoplasm, unspecified: Secondary | ICD-10-CM

## 2017-05-10 DIAGNOSIS — R112 Nausea with vomiting, unspecified: Secondary | ICD-10-CM | POA: Diagnosis not present

## 2017-05-10 DIAGNOSIS — E440 Moderate protein-calorie malnutrition: Secondary | ICD-10-CM

## 2017-05-10 DIAGNOSIS — C169 Malignant neoplasm of stomach, unspecified: Secondary | ICD-10-CM

## 2017-05-10 DIAGNOSIS — Z95828 Presence of other vascular implants and grafts: Secondary | ICD-10-CM

## 2017-05-10 DIAGNOSIS — E43 Unspecified severe protein-calorie malnutrition: Secondary | ICD-10-CM

## 2017-05-10 DIAGNOSIS — D630 Anemia in neoplastic disease: Secondary | ICD-10-CM

## 2017-05-10 LAB — CBC WITH DIFFERENTIAL/PLATELET
BASO%: 0.5 % (ref 0.0–2.0)
Basophils Absolute: 0 10*3/uL (ref 0.0–0.1)
EOS%: 2.3 % (ref 0.0–7.0)
Eosinophils Absolute: 0.1 10*3/uL (ref 0.0–0.5)
HCT: 36.5 % (ref 34.8–46.6)
HGB: 12 g/dL (ref 11.6–15.9)
LYMPH%: 15.5 % (ref 14.0–49.7)
MCH: 29 pg (ref 25.1–34.0)
MCHC: 33 g/dL (ref 31.5–36.0)
MCV: 87.8 fL (ref 79.5–101.0)
MONO#: 0.3 10*3/uL (ref 0.1–0.9)
MONO%: 6.3 % (ref 0.0–14.0)
NEUT#: 4 10*3/uL (ref 1.5–6.5)
NEUT%: 75.4 % (ref 38.4–76.8)
PLATELETS: 256 10*3/uL (ref 145–400)
RBC: 4.15 10*6/uL (ref 3.70–5.45)
RDW: 14.7 % — ABNORMAL HIGH (ref 11.2–14.5)
WBC: 5.3 10*3/uL (ref 3.9–10.3)
lymph#: 0.8 10*3/uL — ABNORMAL LOW (ref 0.9–3.3)

## 2017-05-10 LAB — COMPREHENSIVE METABOLIC PANEL
ALT: 42 U/L (ref 0–55)
ANION GAP: 9 meq/L (ref 3–11)
AST: 30 U/L (ref 5–34)
Albumin: 3.3 g/dL — ABNORMAL LOW (ref 3.5–5.0)
Alkaline Phosphatase: 47 U/L (ref 40–150)
BUN: 11.2 mg/dL (ref 7.0–26.0)
CHLORIDE: 102 meq/L (ref 98–109)
CO2: 28 meq/L (ref 22–29)
CREATININE: 0.6 mg/dL (ref 0.6–1.1)
Calcium: 8.9 mg/dL (ref 8.4–10.4)
EGFR: >90 mL/min/{1.73_m2} (ref >90)
Glucose: 101 mg/dl (ref 70–140)
POTASSIUM: 4.3 meq/L (ref 3.5–5.1)
Sodium: 139 mEq/L (ref 136–145)
Total Bilirubin: <0.22 mg/dL (ref 0.20–1.20)
Total Protein: 6.7 g/dL (ref 6.4–8.3)

## 2017-05-10 LAB — CEA (IN HOUSE-CHCC): CEA (CHCC-In House): <1 ng/mL (ref 0.00–5.00)

## 2017-05-10 MED ORDER — DEXTROSE 5 % IV SOLN
85.0000 mg/m2 | Freq: Once | INTRAVENOUS | Status: AC
Start: 2017-05-10 — End: 2017-05-10
  Administered 2017-05-10: 115 mg via INTRAVENOUS
  Filled 2017-05-10: qty 20

## 2017-05-10 MED ORDER — SODIUM CHLORIDE 0.9% FLUSH
10.0000 mL | Freq: Once | INTRAVENOUS | Status: AC
  Administered 2017-05-10: 10 mL
  Filled 2017-05-10: qty 10

## 2017-05-10 MED ORDER — PALONOSETRON HCL INJECTION 0.25 MG/5ML
0.2500 mg | Freq: Once | INTRAVENOUS | Status: AC
  Administered 2017-05-10: 0.25 mg via INTRAVENOUS

## 2017-05-10 MED ORDER — PALONOSETRON HCL INJECTION 0.25 MG/5ML
INTRAVENOUS | Status: AC
  Filled 2017-05-10: qty 5

## 2017-05-10 MED ORDER — DEXAMETHASONE SODIUM PHOSPHATE 10 MG/ML IJ SOLN
INTRAMUSCULAR | Status: AC
  Filled 2017-05-10: qty 1

## 2017-05-10 MED ORDER — SODIUM CHLORIDE 0.9 % IV SOLN
2400.0000 mg/m2 | INTRAVENOUS | Status: DC
  Administered 2017-05-10: 3250 mg via INTRAVENOUS
  Filled 2017-05-10: qty 65

## 2017-05-10 MED ORDER — DEXTROSE 5 % IV SOLN
Freq: Once | INTRAVENOUS | Status: AC
  Administered 2017-05-10: 12:00:00 via INTRAVENOUS

## 2017-05-10 MED ORDER — LEUCOVORIN CALCIUM INJECTION 350 MG
400.0000 mg/m2 | Freq: Once | INTRAVENOUS | Status: AC
  Administered 2017-05-10: 540 mg via INTRAVENOUS
  Filled 2017-05-10: qty 27

## 2017-05-10 MED ORDER — DEXAMETHASONE SODIUM PHOSPHATE 10 MG/ML IJ SOLN
10.0000 mg | Freq: Once | INTRAMUSCULAR | Status: AC
  Administered 2017-05-10: 10 mg via INTRAVENOUS

## 2017-05-10 MED ORDER — FLUOROURACIL CHEMO INJECTION 2.5 GM/50ML
400.0000 mg/m2 | Freq: Once | INTRAVENOUS | Status: AC
  Administered 2017-05-10: 550 mg via INTRAVENOUS
  Filled 2017-05-10: qty 11

## 2017-05-10 NOTE — Telephone Encounter (Signed)
Scheduled appt per 7/3 los - Gave patient AVS and calender per los.  

## 2017-05-10 NOTE — Progress Notes (Signed)
Pt tolerated infusion well/  1530: blood return noted before, during and after Adrucil push. Verbal (Via interpreter Shirlee Limerick) and printed discharge instructions given to pt. Pt verbalizes understanding per interpreter Shirlee Limerick. Pt stable at discharge.

## 2017-05-10 NOTE — Patient Instructions (Signed)
[facility] Discharge Instructions for Patients Receiving Chemotherapy  Today you received the following chemotherapy agents: Oxaliplatin, Leucovorin and Adrucil   To help prevent nausea and vomiting after your treatment, we encourage you to take your nausea medication as directed.    If you develop nausea and vomiting that is not controlled by your nausea medication, call the clinic.   BELOW ARE SYMPTOMS THAT SHOULD BE REPORTED IMMEDIATELY:  *FEVER GREATER THAN 100.5 F  *CHILLS WITH OR WITHOUT FEVER  NAUSEA AND VOMITING THAT IS NOT CONTROLLED WITH YOUR NAUSEA MEDICATION  *UNUSUAL SHORTNESS OF BREATH  *UNUSUAL BRUISING OR BLEEDING  TENDERNESS IN MOUTH AND THROAT WITH OR WITHOUT PRESENCE OF ULCERS  *URINARY PROBLEMS  *BOWEL PROBLEMS  UNUSUAL RASH Items with * indicate a potential emergency and should be followed up as soon as possible.  Feel free to call the clinic you have any questions or concerns. The clinic phone number is [phone].  Please show the [facility] at check-in to the Emergency Department and triage nurse.       Oxaliplatin Injection ?y l thu?c g? OXALIPLATIN l thu?c ha tr? li?u. N nh?m ??n cc t? bo phn chia nhanh, nh? cc t? bo ung th?, v lm cho cc t? bo ? ch?t. Thu?c ny ???c dng ?? ?i?u tr? ung th? ru?t gi v ru?t th?ng (tr?c trng), v nhi?u lo?i ung th? khc. Thu?c ny c th? ???c dng cho nh?ng m?c ?ch khc; hy h?i ng??i cung c?p d?ch v? y t? ho?c d??c s? c?a mnh, n?u qu v? c th?c m?c. (CC) NHN HI?U PH? BI?N: [place] Ti c?n ph?i bo cho ng??i cung c?p d?ch v? y t? c?a mnh ?i?u g tr??c khi dng thu?c ny? H? c?n bi?t li?u qu v? hi?n c b?t k? tnh tr?ng no sau ?y hay khng: -b?nh th?n -pha?n ??ng b?t th???ng ho??c di? ??ng v??i oxaliplatin ho?c thu?c ha tr? li?u khc -pha?n ??ng b?t th???ng ho??c di? ??ng v??i ca?c d??c ph?m kha?c, th?c ph?m, thu?c nhu?m, ho??c ch?t ba?o qua?n -?ang c  thai ho??c ??nh co? thai -?ang cho con bu? Ti nn s? d?ng thu?c ny nh? th? no? Thu?c ny ?? truy?n vo t?nh m?ch. Thu?c ny ???c cho trong b?nh vi?n ho?c phng m?ch b?i chuyn vin y t? ???c hu?n luy?n ??c bi?t. Hy bn v?i bc s? nhi khoa c?a qu v? v? vi?c dng thu?c ny ? tr? em. C th? c?n ch?m [place] ??c bi?t. Qu li?u: N?u qu v? cho r?ng mnh ? dng qu nhi?u thu?c ny, th hy lin l?c v?i trung tm ki?m sot ch?t ??c ho?c phng c?p c?u ngay l?p t?c. L?U : Thu?c ny ch? dnh ring cho qu v?. Khng chia s? thu?c ny v?i nh?ng ng??i khc. N?u ti l? qun m?t li?u th sao? ?i?u quan tr?ng l khng nn b? l? li?u thu?c no. Hy lin l?c v?i bc s? ho?c [place] vin y t? c?a mnh, n?u qu v? khng th? gi? ?ng cu?c h?n khm. Nh?ng g c th? t??ng tc v?i thu?c ny? -m?t s? thu?c lm t?ng s? l??ng t? bo mu, ch?ng h?n nh? filgrastim, pegfilgrastim, sargramostim -probenecid -m?t s? thu?c khng sinh, ch?ng h?n nh? amikacin, gentamicin, neomycin, polymyxin B, streptomycin, tobramycin -zalcitabine Hy bo cho bc s? ho?c chuyn vin y t? c?a mnh tr??c khi dng b?t k? thu?c no trong s? nh?ng thu?c ny: -acetaminophen -aspirin -ibuprofen -ketoprofen -naproxen Dan[facility] sch ny c th? khng m t? ??  h?t cc t??ng tc c th? x?y ra. Hy ??a cho ng??i cung c?p d?ch v? y t? c?a mnh danh sch t?t c? cc thu?c, th?o d??c, cc thu?c khng c?n toa, ho?c cc ch? ph?m b? sung m qu v? dng. C?ng nn bo cho h? bi?t r?ng qu v? c ht thu?c, u?ng r??u, ho?c c s? d?ng ma ty tri php hay khng. Vi th? c th? t??ng tc v?i thu?c c?a qu v?. Ti c?n ph?i theo di ?i?u g trong khi dng thu?c ny? Qu v? s? ???c theo di ch?t ch? trong khi dng thu?c ny. Qu v? s? c?n ph?i ?i lm cc xt nghi?m mu quan tr?ng trong th?i gian dng thu?c ny. Thu?c ny c th? khi?n cho qu v? nh?y c?m h?n v?i l?nh. Khng ???c u?ng ?? u?ng l?nh ho?c n??c ?. Hy che kn cc vng da h? tr??c khi ti?p xc v?i nhi?t ?? l?nh  ho?c cc v?t l?nh. Khi ?i ra ngoi tr?i l?nh, hy m?c qu?n o ?m v che mi?ng v m?i ?? lm ?m khng kh ht vo ph?i. Hy bo cho bc s? n?u qu v? b? nh?y c?m v?i l?nh. Thu?c ny c th? lm cho qu v? c?m th?y khng ???c kh?e nh? th??ng l?. ?i?u ny khng ph?i khng ph? bi?n, b?i v thu?c ha tr? li?u c th? ?nh h??ng ??n c? t? bo lnh l?n t? bo ung th?. Hy t??ng trnh m?i tc d?ng ph?. Hy ti?p t?c ??t ?i?u tr? c?a mnh ngay c? khi qu v? c?m th?y m?t, tr? khi bc s? yu c?u qu v? ng?ng ?i?u tr?[place] m?t s? tr??ng h?p, qu v? c th? ???c cho dng cc thu?c ph? thm ?? gip gi?m tc d?ng ph?. Hy lm theo t?t c? cc h??ng d?n v? vi?c s? d?ng chng. Hy h?i  ki?n bc s? ho?c chuyn vin y t?, n?u qu v? b? s?t, ?n l?nh ho?c ?au h?ng, ho?c c cc tri?u ch?ng khc c?a c?m l?nh ho?c cm. Khng ???c t? ?i?u tr? cho mnh. Thu?c ny lm gi?m kh? n?ng ch?ng l?i cc b?nh nhi?m trng c?a c? th?. Hy c? trnh ? g?n nh?ng ng??i b? b?nh. Thu?c ny c th? lm t?ng nguy c? b? b?m tm ho?c ch?y mu. Hy lin l?c v?i bc s? ho?c chuyn vin y t?, n?u qu v? th?y ch?y mu b?t th??ng. Hy c?n th?n khi ?nh r?ng ho?c x?a r?ng b?ng ch? nha khoa ho?c b?ng t?m, b?i v qu v? c th? d? b? nhi?m trng ho?c d? b? ch?y mu h?n. N?u qu v? c ?i lm r?ng, th hy bo v?i nha s? r?ng qu v? ?ang dng thu?c ny. Trnh dng cc thu?c c ch?a aspirin, acetaminophen, ibuprofen, naproxen, ho?c ketoprofen, tr? khi ? ???c bc s? ch? d?n. Cc thu?c ny c th? che l?p tri?u ch?ng s?t. Khng ???c ?? c thai trong khi dng thu?c ny. Ph? n? c?n ph?i thng bo cho bc s? c?a mnh, n?u mu?n c thai ho?c ngh? r?ng c th? mnh ? c [place]. C nguy c? v? cc tc d?ng ph? nghim tr?ng ??i v?i [place] nhi. Hy th?o lu?n v?i bc s? ho?c chuyn vin y t? ho?c d??c s? ?? bi?t thm thng tin. Khng ???c nui con b?ng s?a m? trong khi dng thu?c ny. Hy lin l?c v?i bc s? ho?c chuyn vin y t? n?u qu v? b? tiu ch?y. Khng ???c t? ?i?u tr? cho  mnh. Ti c th? nh?n th?y  nh?ng tc d?ng ph? no khi dng thu?c ny? Nh?ng tc d?ng ph? qu v? c?n ph?i bo cho bc s? ho?c chuyn vin y t? cng s?m cng t?t: -cc ph?n ?ng d? ?ng, ch?ng h?n nh? da b? m?n ??, ng?a, n?i my ?ay, s?ng ? m?t, mi, ho?c l??i -gi?m s? l??ng t? bo mu - thu?c ny c th? lm gi?m s? l??ng t? bo b?ch c?u, t? bo h?ng c?u v ti?u c?u. Qu v? c th? c nguy c? cao b? nhi?m trng v xu?t huy?t. -cc d?u hi?u nhi?m trng - s?t ho?c ?n l?nh, ho, ?au h?ng, kh ?i ti?u ho?c ?i ti?u ?au -cc d?u hi?u gi?m ti?u c?u ho?c xu?t huy?t - b?m tm, cc n?t l?m t?m ?? trn da, phn c mu ?en, mu h?c n, ch?y mu cam -cc d?u hi?u gi?m s? l??ng t? bo h?ng c?u - c?m th?y y?u ?t ho?c m?t m?i m?t cch b?t th??ng, cc c?n ng?t x?u, chong vng -cc v?n ?? v? h h?p -?au ng?c, t?c ng?c -ho -tiu ch?y -c?ng quai hm -lot mi?ng -bu?n i ho?c i m?a -?au, s?ng, ??, ho?c kch ?ng ? ch? tim -?au, t ho?c c?m gic nh? b? ki?n b ? bn tay ho?c bn chn -cc v?n ?? v? gi? th?ng b?ng, ni n?ng, ?i l?i -m?n ??, r?p da, bong ho?c trc da, bao g?m bn trong mi?ng. -kh ?i ti?u ho?c thay ??i l??ng n??c ti?u ???c bi ti?t Cc tc d?ng ph? khng c?n ph?i ch?m [place] y t? (hy bo cho bc s? ho?c chuyn vin y t?, n?u cc tc d?ng ph? ny ti?p di?n ho?c gy phi?n toi): -thay ??i th? l?c -to bn -r?ng tc -m?t c?m gic ngon mi?ng -thay ??i v? gic, ho?c c v? kim lo?i trong mi?ng -?au b?ng [facility] sch ny c th? khng m t? ?? h?t cc tc d?ng ph? c th? x?y ra. Xin g?i t?i bc s? c?a mnh ?? ???c c? v?n chuyn mn v? cc tc d?ng ph?Sander Nephew v? c th? t??ng trnh cc tc d?ng ph? cho FDA theo s? [phone]. Ti nn c?t gi? thu?c c?a mnh ? ?u? Thu?c ny ???c s? d?ng b?i chuyn vin y t? ? b?nh vi?n ho?c ? phng m?ch. Qu v? s? khng ???c c?p thu?c ny ?? c?t gi? t?i nh. L?U : ?y l b?n tm t?t. N c th? khng bao hm t?t c? thng tin c th? c. N?u qu v? th?c m?c v? thu?c ny, xin trao  ??i v?i bc s?, d??c s?, ho?c ng??i cung c?p d?ch v? y t? c?a mnh.  2018 [employer]/Gold Standard (2009-09-26 00:00:00)    Leucovorin injection ?y l thu?c g? LEUCOVORIN ???c dng ?? phng ng?a ho?c ?i?u tr? cc tc d?ng c h?i c?a m?t s? thu?c. Thu?c ny ???c dng ?? ?i?u tr? thi?u mu do l??ng acid folic trong c? th? th?p. N c?ng ???c dng cng v?i 5-fluorouracil (5-FU) ?? ?i?u tr? ung th? ru?t gi. Thu?c ny c th? ???c dng cho nh?ng m?c ?ch khc; hy h?i ng??i cung c?p d?ch v? y t? ho?c d??c s? c?a mnh, n?u qu v? c th?c m?c. Ti c?n ph?i bo cho ng??i cung c?p d?ch v? y t? c?a mnh ?i?u g tr??c khi dng thu?c ny? H? c?n bi?t li?u qu v? hi?n c b?t k? tnh tr?ng no sau ?y hay khng: -thi?u mu do hm l??ng vitamin B-12 trong mu th?p -pha?n ??ng b?t th???ng ho??c di? ??ng v??i  leucovorin ho?c acid folic -pha?n ??ng b?t th???ng ho??c di? ??ng v??i ca?c d??c ph?m kha?c, th?c ph?m, thu?c nhu?m, ho??c ch?t ba?o qua?n -?ang c thai ho??c ??nh co? thai -?ang cho con bu? Ti nn s? d?ng thu?c ny nh? th? no? Thu?c ny ?? tim vo t?nh m?ch ho?c tim vo b?p th?t. Thu?c ny ???c s? d?ng b?i chuyn vin y t? ? b?nh vi?n ho?c ? phng m?ch. Hy bn v?i bc s? nhi khoa c?a qu v? v? vi?c dng thu?c ny ? tr? em. C th? c?n ch?m [place] ??c bi?t. Qu li?u: N?u qu v? cho r?ng mnh ? dng qu nhi?u thu?c ny, th hy lin l?c v?i trung tm ki?m sot ch?t ??c ho?c phng c?p c?u ngay l?p t?c. L?U : Thu?c ny ch? dnh ring cho qu v?. Khng chia s? thu?c ny v?i nh?ng ng??i khc. N?u ti l? qun m?t li?u th sao? ?i?u ny khng p d?ng. Nh?ng g c th? t??ng tc v?i thu?c ny? -capecitabine -fluorouracil -phenobarbital -phenytoin -primidone -trimethoprim-sulfamethoxazole [facility] sch ny c th? khng m t? ?? h?t cc t??ng tc c th? x?y ra. Hy ??a cho ng??i cung c?p d?ch v? y t? c?a mnh danh sch t?t c? cc thu?c, th?o d??c, cc thu?c khng c?n toa, ho?c cc ch? ph?m b? sung m qu  v? dng. C?ng nn bo cho h? bi?t r?ng qu v? c ht thu?c, u?ng r??u, ho?c c s? d?ng ma ty tri php hay khng. Vi th? c th? t??ng tc v?i thu?c c?a qu v?. Ti c?n ph?i theo di ?i?u g trong khi dng thu?c ny? Qu v? s? ???c theo di ch?t ch? trong khi dng thu?c ny. Thu?c ny c th? lm t?ng cc tc d?ng ph? c?a 5-fluorouracil, 5-FU. Hy bo cho bc s? ho?c chuyn vin y t?, n?u qu v? b? tiu ch?y ho?c b? l? mi?ng khng ?? ho?c tr? nn n?ng h?n. Ti c th? nh?n th?y nh?ng tc d?ng ph? no khi dng thu?c ny? Nh?ng tc d?ng ph? qu v? c?n ph?i bo cho bc s? ho?c chuyn vin y t? cng s?m cng t?t: -cc ph?n ?ng d? ?ng, ch?ng h?n nh? da b? m?n ??, ng?a, n?i my ?ay, s?ng ? m?t, mi, ho?c l??i -cc v?n ?? v? h h?p -s?t, nhi?m trng -lot mi?ng -b? b?m tm ho?c xu?t huy?t b?t th??ng -m?t m?i ho?c y?u ?t b?t th??ng Cc tc d?ng ph? khng c?n ph?i ch?m [place] y t? (hy bo cho bc s? ho?c chuyn vin y t?, n?u cc tc d?ng ph? ny ti?p di?n ho?c gy phi?n toi): -to bn ho?c tiu ch?y -m?t c?m gic ngon mi?ng -bu?n i ho?c i m?a [facility] sch ny c th? khng m t? ?? h?t cc tc d?ng ph? c th? x?y ra. Xin g?i t?i bc s? c?a mnh ?? ???c c? v?n chuyn mn v? cc tc d?ng ph?Sander Nephew v? c th? t??ng trnh cc tc d?ng ph? cho FDA theo s? [phone]. Ti nn c?t gi? thu?c c?a mnh ? ?u? Thu?c ny ???c s? d?ng b?i chuyn vin y t? ? b?nh vi?n ho?c ? phng m?ch. Qu v? s? khng ???c c?p thu?c ny ?? c?t gi? t?i nh. L?U : ?y l b?n tm t?t. N c th? khng bao hm t?t c? thng tin c th? c. N?u qu v? th?c m?c v? thu?c ny, xin trao ??i v?i bc s?, d??c s?, ho?c ng??i cung c?p d?ch v? y t? c?a mnh.  2018 Elsevier/Gold  Standard (2009-09-26 00:00:00)   Fluorouracil, 5-FU injection ?y l thu?c g? FLUOROURACIL, 5-FU l thu?c ha tr? li?u. N lm ch?m s? pht tri?n c?a cc t? bo ung th?. Thu?c ny ???c dng ?? ?i?u tr? nhi?u lo?i ung th?, ch?ng h?n nh? ung th? v, ung th? ru?t gi ho?c  ru?t th?ng (tr?c trng), ung th? tuy?n t?y, v ung th? bao t?. Thu?c ny c th? ???c dng cho nh?ng m?c ?ch khc; hy h?i ng??i cung c?p d?ch v? y t? ho?c d??c s? c?a mnh, n?u qu v? c th?c m?c. (CC) NHN HI?U PH? BI?N: Adrucil Ti c?n ph?i bo cho ng??i cung c?p d?ch v? y t? c?a mnh ?i?u g tr??c khi dng thu?c ny? H? c?n bi?t li?u qu v? hi?n c b?t k? tnh tr?ng no sau ?y hay khng: -cc r?i lo?n v? mu -ch?ng thi?u h?t men dihydropyrimidine dehydrogenase (DPD) -nhi?m trng (??c bi?t l nhi?m virus, ch?ng h?n nh? th?y ??u, l? mi?ng, ho?c herpes) -b?nh th?n -b?nh gan -b? r?i lo?n dinh d??ng, dinh d??ng km -m?i x? tr? g?n ?y ho?c ?ang x? tr? -pha?n ??ng b?t th???ng ho??c di? ??ng v??i fluorouracil ho?c thu?c ha tr? li?u khc -pha?n ??ng b?t th???ng ho??c di? ??ng v??i ca?c d??c ph?m kha?c, th?c ph?m, thu?c nhu?m, ho??c ch?t ba?o qua?n -?ang c thai ho??c ??nh co? thai -?ang cho con bu? Ti nn s? d?ng thu?c ny nh? th? no? Thu?c ny ?? tim ho?c truy?n vo t?nh m?ch. Thu?c ny ???c cho trong b?nh vi?n ho?c phng m?ch b?i chuyn vin y t? ???c hu?n luy?n ??c bi?t. Hy bn v?i bc s? nhi khoa c?a qu v? v? vi?c dng thu?c ny ? tr? em. C th? c?n ch?m [place] ??c bi?t. Qu li?u: N?u qu v? cho r?ng mnh ? dng qu nhi?u thu?c ny, th hy lin l?c v?i trung tm ki?m sot ch?t ??c ho?c phng c?p c?u ngay l?p t?c. L?U : Thu?c ny ch? dnh ring cho qu v?. Khng chia s? thu?c ny v?i nh?ng ng??i khc. N?u ti l? qun m?t li?u th sao? ?i?u quan tr?ng l khng nn b? l? li?u thu?c no. Hy lin l?c v?i bc s? ho?c [place] vin y t? c?a mnh, n?u qu v? khng th? gi? ?ng cu?c h?n khm. Nh?ng g c th? t??ng tc v?i thu?c ny? -allopurinol -cimetidine -dapsone -digoxin -hydroxyurea -leucovorin -levamisole -m?t s? thu?c ?? tr? cc ch?ng co gi?t, ch?ng h?n nh? ethotoin, fosphenytoin, phenytoin -m?t s? thu?c lm t?ng s? l??ng t? bo mu, ch?ng h?n nh? filgrastim,  pegfilgrastim, sargramostim -m?t s? thu?c ?i?u tr? ho?c phng ng?a c?c mu ?ng, ch?ng h?n nh? warfarin, enoxaparin, v dalteparin -methotrexate -metronidazole -pyrimethamine -m?t s? thu?c ha tr? li?u khc, ch?ng h?n nh? busulfan, cisplatin, estramustine, vinblastine -trimethoprim -trimetrexate -cc thu?c ch?ng ng?a Hy bo cho bc s? ho?c chuyn vin y t? c?a mnh tr??c khi dng b?t k? thu?c no trong s? nh?ng thu?c ny: -acetaminophen -aspirin -ibuprofen -ketoprofen -naproxen Dan[facility] sch ny c th? khng m t? ?? h?t cc t??ng tc c th? x?y ra. Hy ??a cho ng??i cung c?p d?ch v? y t? c?a mnh danh sch t?t c? cc thu?c, th?o d??c, cc thu?c khng c?n toa, ho?c cc ch? ph?m b? sung m qu v? dng. C?ng nn bo cho h? bi?t r?ng qu v? c ht thu?c, u?ng r??u, ho?c c s? d?ng ma ty tri php hay khng. Vi th? c th? t??ng tc v?i thu?c c?a qu v?. Ti c?n  ph?i theo di ?i?u g trong khi dng thu?c ny? Hy ?i g?p bc s? ho?c [place] vin y t? ?? theo di ??nh k? s? c?i thi?n c?a qu v?. Thu?c ny c th? lm cho qu v? c?m th?y khng ???c kh?e nh? th??ng l?. ?i?u ny khng ph?i khng ph? bi?n, b?i v thu?c ha tr? li?u c th? ?nh h??ng ??n c? t? bo lnh l?n t? bo ung th?. Hy t??ng trnh m?i tc d?ng ph?. Hy ti?p t?c ??t ?i?u tr? c?a mnh ngay c? khi qu v? c?m th?y m?t, tr? khi bc s? yu c?u qu v? ng?ng ?i?u tr?[place] m?t s? tr??ng h?p, qu v? c th? ???c cho dng cc thu?c ph? thm ?? gip gi?m tc d?ng ph?. Hy lm theo t?t c? cc h??ng d?n v? vi?c s? d?ng chng. Hy h?i  ki?n bc s? ho?c chuyn vin y t?, n?u qu v? b? s?t, ?n l?nh ho?c ?au h?ng, ho?c c cc tri?u ch?ng khc c?a c?m l?nh ho?c cm. Khng ???c t? ?i?u tr? cho mnh. Thu?c ny lm gi?m kh? n?ng ch?ng l?i cc b?nh nhi?m trng c?a c? th?. Hy c? trnh ? g?n nh?ng ng??i b? b?nh. Thu?c ny c th? lm t?ng nguy c? b? b?m tm ho?c ch?y mu. Hy lin l?c v?i bc s? ho?c chuyn vin y t?, n?u qu v? th?y ch?y mu b?t th??ng. Hy  c?n th?n khi ?nh r?ng ho?c x?a r?ng b?ng ch? nha khoa ho?c b?ng t?m, b?i v qu v? c th? d? b? nhi?m trng ho?c d? b? ch?y mu h?n. N?u qu v? c ?i lm r?ng, th hy bo v?i nha s? r?ng qu v? ?ang dng thu?c ny. Trnh dng cc thu?c c ch?a aspirin, acetaminophen, ibuprofen, naproxen, ho?c ketoprofen, tr? khi ? ???c bc s? ch? d?n. Cc thu?c ny c th? che l?p tri?u ch?ng s?t. Khng ???c ?? c thai trong khi dng thu?c ny. Ph? n? c?n ph?i thng bo cho bc s? c?a mnh, n?u mu?n c thai ho?c ngh? r?ng c th? mnh ? c [place]. C nguy c? v? cc tc d?ng ph? nghim tr?ng ??i v?i [place] nhi. Hy th?o lu?n v?i bc s? ho?c chuyn vin y t? ho?c d??c s? ?? bi?t thm thng tin. Khng ???c nui con b?ng s?a m? trong khi dng thu?c ny. Nam gi?i c?n ph?i thng bo cho bc s? c?a mnh, n?u h? mu?n ???c lm b?. Thu?c ny c th? lm gi?m s? l??ng tinh trng. Khng ???c ?i?u tr? tiu ch?y b?ng cc lo?i thu?c khng c?n toa. Hy lin l?c v?i bc s?, n?u qu v? b? tiu ch?y ko di h?n 2 ngy ho?c b? tiu ch?y x?i x? v tr?m tr?ng. Thu?c ny c th? lm cho qu v? b? nh?y c?m h?n v?i nh n?ng. Hy trnh ra n?ng. N?u qu v? khng th? trnh ra n?ng, th hy m?c trang ph?c b?o v? v bi thu?c ch?ng n?ng. Khng ???c dng cc ?n chi?u nh n?ng (sun lamps) ho?c gi??ng ho?c bu?ng dng ?? t?o ln da rm n?ng (sun tanning beds or booths). Ti c th? nh?n th?y nh?ng tc d?ng ph? no khi dng thu?c ny? Nh?ng tc d?ng ph? qu v? c?n ph?i bo cho bc s? ho?c chuyn vin y t? cng s?m cng t?t: -cc ph?n ?ng d? ?ng, ch?ng h?n nh? da b? m?n ??, ng?a, n?i my ?ay, s?ng ? m?t, mi, ho?c l??i -gi?m s? l??ng t? bo mu - thu?c ny c th?  lm gi?m s? l??ng t? bo b?ch c?u, t? bo h?ng c?u v ti?u c?u. Qu v? c th? c nguy c? cao b? nhi?m trng v xu?t huy?t. -cc d?u hi?u nhi?m trng - s?t ho?c ?n l?nh, ho, ?au h?ng, kh ?i ti?u ho?c ?i ti?u ?au -cc d?u hi?u gi?m ti?u c?u ho?c xu?t huy?t - b?m tm, cc n?t l?m t?m ?? trn da, phn c  mu ?en, mu h?c n, c mu trong n??c ti?u -cc d?u hi?u gi?m s? l??ng t? bo h?ng c?u - c?m th?y y?u ?t ho?c m?t m?i m?t cch b?t th??ng, cc c?n ng?t x?u, chong vng -cc v?n ?? v? h h?p -thay ??i th? l?c -?au ng?c -lot mi?ng -bu?n i ho?c i m?a -?au, s?ng, ?? ? ch? tim -?au, t ho?c c?m gic nh? b? ki?n b ? bn tay ho?c bn chn -??, s?ng, ho?c l? lot ? bn tay ho?c bn chn -?au b?ng -xu?t huy?t b?t th??ng Cc tc d?ng ph? khng c?n ph?i ch?m Wyatt y t? (hy bo cho bc s? ho?c chuyn vin y t?, n?u cc tc d?ng ph? ny ti?p di?n ho?c gy phi?n toi): -cc thay ??i ? mng tay ho?c mng chn -tiu ch?y -da kh, ng?a -r?ng tc -?au ??u -m?t c?m gic ngon mi?ng -m?t b? nh?y c?m h?n v?i nh sng -kh ch?u ? bao t? -b? ch?y n??c m?t m?t cch b?t th??ng Danh sch ny c th? khng m t? ?? h?t cc tc d?ng ph? c th? x?y ra. Xin g?i t?i bc s? c?a mnh ?? ???c c? v?n chuyn mn v? cc tc d?ng ph?Sander Nephew v? c th? t??ng trnh cc tc d?ng ph? cho FDA theo s? [phone]. Ti nn c?t gi? thu?c c?a mnh ? ?u? Thu?c ny ???c s? d?ng b?i chuyn vin y t? ? b?nh vi?n ho?c ? phng m?ch. Qu v? s? khng ???c c?p thu?c ny ?? c?t gi? t?i nh. L?U : ?y l b?n tm t?t. N c th? khng bao hm t?t c? thng tin c th? c. N?u qu v? th?c m?c v? thu?c ny, xin trao ??i v?i bc s?, d??c s?, ho?c ng??i cung c?p d?ch v? y t? c?a mnh.  2018 [employer] (2009-09-26 00:00:00)

## 2017-05-10 NOTE — Patient Instructions (Signed)

## 2017-05-11 ENCOUNTER — Encounter: Payer: Self-pay | Admitting: Hematology

## 2017-05-11 DIAGNOSIS — Z7189 Other specified counseling: Secondary | ICD-10-CM | POA: Insufficient documentation

## 2017-05-11 DIAGNOSIS — D630 Anemia in neoplastic disease: Secondary | ICD-10-CM | POA: Insufficient documentation

## 2017-05-12 ENCOUNTER — Ambulatory Visit (HOSPITAL_BASED_OUTPATIENT_CLINIC_OR_DEPARTMENT_OTHER): Payer: Medicaid Other

## 2017-05-12 VITALS — BP 90/66 | HR 76 | Temp 97.8°F | Resp 18

## 2017-05-12 DIAGNOSIS — Z452 Encounter for adjustment and management of vascular access device: Secondary | ICD-10-CM | POA: Diagnosis present

## 2017-05-12 DIAGNOSIS — C163 Malignant neoplasm of pyloric antrum: Secondary | ICD-10-CM | POA: Diagnosis not present

## 2017-05-12 MED ORDER — LIDOCAINE-PRILOCAINE 2.5-2.5 % EX CREA
TOPICAL_CREAM | CUTANEOUS | Status: AC
Start: 2017-05-12 — End: 2017-05-12
  Filled 2017-05-12: qty 5

## 2017-05-12 MED ORDER — HEPARIN SOD (PORK) LOCK FLUSH 100 UNIT/ML IV SOLN
500.0000 [IU] | Freq: Once | INTRAVENOUS | Status: AC | PRN
  Administered 2017-05-12: 500 [IU]
  Filled 2017-05-12: qty 5

## 2017-05-12 MED ORDER — SODIUM CHLORIDE 0.9% FLUSH
10.0000 mL | INTRAVENOUS | Status: DC | PRN
Start: 2017-05-12 — End: 2017-05-12
  Administered 2017-05-12: 10 mL
  Filled 2017-05-12: qty 10

## 2017-05-18 ENCOUNTER — Encounter (HOSPITAL_COMMUNITY): Payer: Self-pay

## 2017-05-24 ENCOUNTER — Other Ambulatory Visit (HOSPITAL_BASED_OUTPATIENT_CLINIC_OR_DEPARTMENT_OTHER): Payer: Medicaid Other

## 2017-05-24 ENCOUNTER — Ambulatory Visit: Payer: Medicaid Other

## 2017-05-24 ENCOUNTER — Ambulatory Visit (HOSPITAL_BASED_OUTPATIENT_CLINIC_OR_DEPARTMENT_OTHER): Payer: Medicaid Other | Admitting: Nurse Practitioner

## 2017-05-24 ENCOUNTER — Ambulatory Visit (HOSPITAL_BASED_OUTPATIENT_CLINIC_OR_DEPARTMENT_OTHER): Payer: Medicaid Other

## 2017-05-24 ENCOUNTER — Other Ambulatory Visit (HOSPITAL_COMMUNITY)
Admission: AD | Admit: 2017-05-24 | Discharge: 2017-05-24 | Disposition: A | Discharge status: Home / Self Care | Payer: Medicaid Other | Source: Ambulatory Visit | Attending: Hematology | Admitting: Hematology

## 2017-05-24 VITALS — BP 109/75 | HR 83 | Temp 97.7°F | Resp 18 | Ht 64.0 in | Wt 94.2 lb

## 2017-05-24 DIAGNOSIS — Z809 Family history of malignant neoplasm, unspecified: Secondary | ICD-10-CM

## 2017-05-24 DIAGNOSIS — D630 Anemia in neoplastic disease: Secondary | ICD-10-CM | POA: Diagnosis not present

## 2017-05-24 DIAGNOSIS — C163 Malignant neoplasm of pyloric antrum: Secondary | ICD-10-CM

## 2017-05-24 DIAGNOSIS — C786 Secondary malignant neoplasm of retroperitoneum and peritoneum: Secondary | ICD-10-CM

## 2017-05-24 DIAGNOSIS — E43 Unspecified severe protein-calorie malnutrition: Secondary | ICD-10-CM

## 2017-05-24 DIAGNOSIS — Z5111 Encounter for antineoplastic chemotherapy: Secondary | ICD-10-CM

## 2017-05-24 DIAGNOSIS — R112 Nausea with vomiting, unspecified: Secondary | ICD-10-CM

## 2017-05-24 DIAGNOSIS — C801 Malignant (primary) neoplasm, unspecified: Secondary | ICD-10-CM

## 2017-05-24 DIAGNOSIS — R634 Abnormal weight loss: Secondary | ICD-10-CM | POA: Diagnosis not present

## 2017-05-24 DIAGNOSIS — Z95828 Presence of other vascular implants and grafts: Secondary | ICD-10-CM

## 2017-05-24 DIAGNOSIS — R630 Anorexia: Secondary | ICD-10-CM

## 2017-05-24 DIAGNOSIS — E440 Moderate protein-calorie malnutrition: Secondary | ICD-10-CM

## 2017-05-24 DIAGNOSIS — C169 Malignant neoplasm of stomach, unspecified: Secondary | ICD-10-CM

## 2017-05-24 LAB — CBC WITH DIFFERENTIAL/PLATELET
BASO%: 0.6 % (ref 0.0–2.0)
Basophils Absolute: 0 10*3/uL (ref 0.0–0.1)
EOS ABS: 0.1 10*3/uL (ref 0.0–0.5)
EOS%: 4 % (ref 0.0–7.0)
HEMATOCRIT: 33.6 % — AB (ref 34.8–46.6)
HGB: 11.1 g/dL — ABNORMAL LOW (ref 11.6–15.9)
LYMPH#: 1 10*3/uL (ref 0.9–3.3)
LYMPH%: 31.1 % (ref 14.0–49.7)
MCH: 29.3 pg (ref 25.1–34.0)
MCHC: 33 g/dL (ref 31.5–36.0)
MCV: 88.7 fL (ref 79.5–101.0)
MONO#: 0.4 10*3/uL (ref 0.1–0.9)
MONO%: 13.1 % (ref 0.0–14.0)
NEUT%: 51.2 % (ref 38.4–76.8)
NEUTROS ABS: 1.7 10*3/uL (ref 1.5–6.5)
NRBC: 0 % (ref 0–0)
PLATELETS: 236 10*3/uL (ref 145–400)
RBC: 3.79 10*6/uL (ref 3.70–5.45)
RDW: 14.9 % — AB (ref 11.2–14.5)
WBC: 3.3 10*3/uL — AB (ref 3.9–10.3)

## 2017-05-24 LAB — IRON AND TIBC
%SAT: 17 % — ABNORMAL LOW (ref 21–57)
Iron: 57 ug/dL (ref 41–142)
TIBC: 335 ug/dL (ref 236–444)
UIBC: 278 ug/dL (ref 120–384)

## 2017-05-24 LAB — COMPREHENSIVE METABOLIC PANEL
ALBUMIN: 3.2 g/dL — AB (ref 3.5–5.0)
ALK PHOS: 55 U/L (ref 40–150)
ALT: 25 U/L (ref 0–55)
AST: 22 U/L (ref 5–34)
Anion Gap: 7 mEq/L (ref 3–11)
BUN: 12.3 mg/dL (ref 7.0–26.0)
CALCIUM: 8.9 mg/dL (ref 8.4–10.4)
CHLORIDE: 104 meq/L (ref 98–109)
CO2: 25 mEq/L (ref 22–29)
Creatinine: 0.6 mg/dL (ref 0.6–1.1)
GLUCOSE: 100 mg/dL (ref 70–140)
POTASSIUM: 4.5 meq/L (ref 3.5–5.1)
Sodium: 136 mEq/L (ref 136–145)
Total Bilirubin: 0.25 mg/dL (ref 0.20–1.20)
Total Protein: 6.7 g/dL (ref 6.4–8.3)

## 2017-05-24 LAB — FERRITIN: Ferritin: 43 ng/ml (ref 9–269)

## 2017-05-24 LAB — PREGNANCY, URINE: PREG TEST UR: NEGATIVE

## 2017-05-24 MED ORDER — FLUOROURACIL CHEMO INJECTION 2.5 GM/50ML
400.0000 mg/m2 | Freq: Once | INTRAVENOUS | Status: AC
  Administered 2017-05-24: 550 mg via INTRAVENOUS
  Filled 2017-05-24: qty 11

## 2017-05-24 MED ORDER — LEUCOVORIN CALCIUM INJECTION 350 MG
400.0000 mg/m2 | Freq: Once | INTRAVENOUS | Status: AC
  Administered 2017-05-24: 540 mg via INTRAVENOUS
  Filled 2017-05-24: qty 27

## 2017-05-24 MED ORDER — DEXTROSE 5 % IV SOLN
85.0000 mg/m2 | Freq: Once | INTRAVENOUS | Status: AC
  Administered 2017-05-24: 115 mg via INTRAVENOUS
  Filled 2017-05-24: qty 20

## 2017-05-24 MED ORDER — DEXAMETHASONE SODIUM PHOSPHATE 10 MG/ML IJ SOLN
10.0000 mg | Freq: Once | INTRAMUSCULAR | Status: AC
  Administered 2017-05-24: 10 mg via INTRAVENOUS

## 2017-05-24 MED ORDER — DEXTROSE 5 % IV SOLN
Freq: Once | INTRAVENOUS | Status: AC
  Administered 2017-05-24: 11:00:00 via INTRAVENOUS

## 2017-05-24 MED ORDER — SODIUM CHLORIDE 0.9% FLUSH
10.0000 mL | INTRAVENOUS | Status: DC | PRN
Start: 2017-05-24 — End: 2017-05-24
  Administered 2017-05-24: 10 mL via INTRAVENOUS
  Filled 2017-05-24: qty 10

## 2017-05-24 MED ORDER — PALONOSETRON HCL INJECTION 0.25 MG/5ML
0.2500 mg | Freq: Once | INTRAVENOUS | Status: AC
  Administered 2017-05-24: 0.25 mg via INTRAVENOUS

## 2017-05-24 MED ORDER — DEXAMETHASONE SODIUM PHOSPHATE 10 MG/ML IJ SOLN
INTRAMUSCULAR | Status: AC
Start: 2017-05-24 — End: 2017-05-24
  Filled 2017-05-24: qty 1

## 2017-05-24 MED ORDER — PALONOSETRON HCL INJECTION 0.25 MG/5ML
INTRAVENOUS | Status: AC
  Filled 2017-05-24: qty 5

## 2017-05-24 MED ORDER — FLUOROURACIL CHEMO INJECTION 5 GM/100ML
2400.0000 mg/m2 | INTRAVENOUS | Status: DC
  Administered 2017-05-24: 3250 mg via INTRAVENOUS
  Filled 2017-05-24: qty 65

## 2017-05-24 NOTE — Progress Notes (Signed)
[name]  [phone] [phone]  Clinic Follow up Note   Patient Care Team: Patient, No Pcp Per as PCP - General (General Practice) Patient, No Pcp Per (General Practice) Arta Silence, MD as Consulting Physician (Gastroenterology) Truitt Merle, MD as Consulting Physician (Hematology) Stark Klein, MD as Consulting Physician (General Surgery) 05/24/2017  CHIEF COMPLAINTS:  Adenocarcinoma of the stomach    Gastric cancer ([place])   04/12/2017 Procedure    Endoscopy by Dr. Paulita Fujita 04/12/17 A large, fungating, infiltrative and ulcerated, non-circumferential mass with no bleeding was found in the prepyloric region of the stomach, extending to the pylorus distally and proximally along the lesser cure of the antrum and body. Mass more ulcer in appearance with heaped-up margins. Tight pinhole pyloric channel, unable to transverse this area with the endoscope.      04/12/2017 Initial Biopsy    FINAL MICROSCOPIC DIAGNOSIS 04/12/17 Stomach, Bx: ADENOCARCINOMA, HIGH GRADE, SIGNET RING CELL SUBTYPE, INVASIVE      04/18/2017 Initial Diagnosis    Gastric cancer ([place])     04/20/2017 Pathology Results    Diagnosis Omentum, resection for tumor - METASTATIC CARCINOMA WITH FOCAL SIGNET RING CELL DIFFERENTIATION. - ONE BENIGN LYMPH NODE (0/1). - SEE COMMENT. Microscopic Comment Given the clinical history as well as the histologic findings, the features are consistent with metastatic gastric carcinoma. Additional studies can be performed upon clinician request. (JBK:ecj 04/21/2017)       HISTORY OF PRESENTING ILLNESS (04/18/2017):  Claire Bray 41 y.o. female is here because of a new diagnosis of adenocarcinoma of the stomach.  The patient presented to the ED on 04/02/17 complaining of right lower abdominal pain that has been intermittent since January 2018. The pain worsened with an onset of emesis and diarrhea. The patient also had weight loss of approximately  25 lbs. Workup CT ABD/PEL showed significant gastroparesis, diffuse bowel dysmotility, and the small bowel was largely distended with fluid and air.  The patient presented to Deliah Goody, PA of [facility] on 04/11/17. By then,. On abdominal exam, a mass was palpated in the right middle and bilateral lower abdomen - questionable stool in the colon/instestines. The patient underwent an endoscopy by Dr. Paulita Fujita on 04/12/17. This revealed a large, fungating, infiltrative mass in the prepyloric region of the stomach extending to the pylorus distally and proximally along the lesser curve of the antrum and body. Biopsies were obtained. This revealed adenocarcinoma, high grade, signet ring cell subtype, invasive. Tumor marker testing is pending.  The patient has been referred today to discuss systemic treatment for the management of her disease. Her husband and two young children were present during the encounter. We had [employer] video interpreter during this encounter who spoke [place].  She is only able to drink liquids; such as milk or soups. She has emesis when she eat solids. She is supplementing with Boost/Ensure with 1.5 - 2 a day. She reports less emesis with Ensure. The nausea is better is she does not eat anything. She reports her stool is normal in color. The patient reports she lost 25 lbs in the last 6 months. She continues to have abdominal pain.  The patient reports she is not using anything for contraception at this time. She used Depo-Provera in the past.  CURRENT THERAPY: first line chemo [id number], every 2 weeks, here for cycle 2.   INTERIM HISTORY: Today, she presents to the clinic accompanied by an interpreter and husband for follow up after first cycle of  chemotherapy, and to receive cycle 2. She has had only one episode of emesis and very little nausea. Her main complaint is that her stomach is constantly growling and she feels hungry. She has been able to drink liquids, and eat  some vietnamese noodles, as well as a little bit of rice. She is using 4 cans via her J tube, and when she goes up to 5 cans she has stomach pain. She feels well otherwise. She states she has had one bowel movement every other day since her first chemo and they have been soft bowel movements. She occasionally will have an isolated episode of diarrhea. She denies fevers, or chills, numbness or tingling in her hands or feet or difficulty with breathing or coughing. Overall she tolerated the chemotherapy without much difficulty. She has been able to sleep ok and is slowly increasing her amount of solids.    MEDICAL HISTORY:  Past Medical History:  Diagnosis Date  . Cancer (HCC)    STOMACH  . No pertinent past medical history     SURGICAL HISTORY: Past Surgical History:  Procedure Laterality Date  . GASTROSTOMY N/A 04/20/2017   Procedure: INSERTION OF J TUBE;  Surgeon: Stark Klein, MD;  Location: [facility];  Service: General;  Laterality: N/A;  . NO PAST SURGERIES    . PORTACATH PLACEMENT Left 04/20/2017   Procedure: INSERTION PORT-A-CATH;  Surgeon: Stark Klein, MD;  Location: [facility];  Service: General;  Laterality: Left;    SOCIAL HISTORY: Social History   Social History  . Marital status: Married    Spouse name: N/A  . Number of children: N/A  . Years of education: N/A   Occupational History  . Not on file.   Social History Main Topics  . Smoking status: Never Smoker  . Smokeless tobacco: Never Used  . Alcohol use No  . Drug use: No  . Sexual activity: Not Currently   Other Topics Concern  . Not on file   Social History Narrative   ** Merged History Encounter **       The patient has 5 siblings. She is the oldest. She is married with 2 children. She has an 24 yo son and a 77 yo daughter. 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 Her husband works for [employer].  FAMILY HISTORY: Family History  Problem Relation Age of Onset  . Hypertension Mother   . Heart  disease Mother   . Other Neg Hx     ALLERGIES:  has No Known Allergies.  MEDICATIONS:  Current Outpatient Prescriptions  Medication Sig Dispense Refill  . dicyclomine (BENTYL) 20 MG tablet Take 1 tablet (20 mg total) by mouth 2 (two) times daily. 20 tablet 0  . HYDROcodone-acetaminophen (HYCET) 7.5-325 mg/15 ml solution Take 10 mLs by mouth every 4 (four) hours as needed for moderate pain or severe pain. 120 mL 0  . ibuprofen (ADVIL,MOTRIN) 600 MG tablet Take 1 tablet (600 mg total) by mouth every 6 (six) hours. (Patient not taking: Reported on 04/18/2017) 60 tablet 0  . lidocaine-prilocaine (EMLA) cream Apply to portacath site 1 hour prior to use 30 g 2  . metoCLOPramide (REGLAN) 5 MG tablet Take 2 tablets (10 mg total) by mouth 4 (four) times daily -  before meals and at bedtime. 120 tablet 1  . Nutritional Supplements (FEEDING SUPPLEMENT, OSMOLITE 1.2 CAL,) LIQD Place 1,000 mLs into feeding tube continuous. 40000 mL 11  . ondansetron (ZOFRAN) 4 MG  tablet Take 1-2 tablets (4-8 mg total) by mouth every 8 (eight) hours as needed for nausea. (Patient not taking: Reported on 05/10/2017) 20 tablet 0  . ondansetron (ZOFRAN) 8 MG tablet Take 1 tablet (8 mg total) by mouth every 8 (eight) hours as needed for nausea or vomiting. (Patient not taking: Reported on 04/18/2017) 30 tablet 0  . pantoprazole (PROTONIX) 40 MG tablet Take 1 tablet (40 mg total) by mouth daily at 12 noon. 30 tablet 3  . polyethylene glycol (MIRALAX) packet Take half a pack (8.5 oz) every 12 hours. Adjust as needed for soft stools 30 each 1  . promethazine (PHENERGAN) 25 MG suppository Place 1 suppository (25 mg total) rectally every 6 (six) hours as needed for nausea. (Patient not taking: Reported on 04/18/2017) 12 each 0  . zolpidem (AMBIEN) 5 MG tablet Take 1 tablet (5 mg total) by mouth at bedtime as needed for sleep. (Patient not taking: Reported on 04/18/2017) 5 tablet 0   No current facility-administered medications for this  visit.    Facility-Administered Medications Ordered in Other Visits  Medication Dose Route Frequency Provider Last Rate Last Dose  . fluorouracil (ADRUCIL) 3,250 mg in sodium chloride 0.9 % 85 mL chemo infusion  2,400 mg/m2 (Treatment Plan Recorded) Intravenous 1 day or 1 dose Truitt Merle, MD      . fluorouracil (ADRUCIL) chemo injection 550 mg  400 mg/m2 (Treatment Plan Recorded) Intravenous Once Truitt Merle, MD      . leucovorin 540 mg in dextrose 5 % 250 mL infusion  400 mg/m2 (Treatment Plan Recorded) Intravenous Once Truitt Merle, MD 139 mL/hr at 05/24/17 1149 540 mg at 05/24/17 1149  . oxaliplatin (ELOXATIN) 115 mg in dextrose 5 % 500 mL chemo infusion  85 mg/m2 (Treatment Plan Recorded) Intravenous Once Truitt Merle, MD 262 mL/hr at 05/24/17 1148 115 mg at 05/24/17 1148    REVIEW OF SYSTEMS:   Constitutional: Denies fevers, chills or abnormal night sweats (+) Weight gain  Ears, nose, mouth, throat, and face: Denies mucositis or sore throat Respiratory: Denies cough, dyspnea or wheezes Cardiovascular: Denies palpitation, chest discomfort or lower extremity swelling Gastrointestinal:  Denies heartburn or change in bowel habits (+) Nausea (+) one episode of emesis since last OV Skin: Denies abnormal skin rashes Lymphatics: Denies new lymphadenopathy or easy bruising Neurological:Denies numbness, tingling or new weaknesses Behavioral/Psych: Mood is stable, no new changes  All other systems were reviewed with the patient and are negative.  PHYSICAL EXAMINATION:  ECOG PERFORMANCE STATUS: 1 - Symptomatic but completely ambulatory  Vitals:   05/24/17 0957  BP: 109/75  Pulse: 83  Resp: 18  Temp: 97.7 F (36.5 C)   Filed Weights   05/24/17 0957  Weight: 94 lb 3.2 oz (42.7 kg)    GENERAL:alert, no distress and comfortable, cachectic SKIN: skin color, texture, turgor are normal, no rashes or significant lesions EYES: normal, conjunctiva are pink and non-injected, sclera clear OROPHARYNX:no  exudate, no erythema and lips, buccal mucosa, and tongue normal  LUNGS: clear to auscultation and percussion with normal breathing effort HEART: regular rate & rhythm and no murmurs and no lower extremity edema ABDOMEN:abdomen soft and normal bowel sounds Musculoskeletal:no cyanosis of digits and no clubbing  PSYCH: alert & oriented x 3 with fluent speech NEURO: no focal motor/sensory deficits  LABORATORY DATA:  I have reviewed the data as listed CBC Latest Ref Rng & Units 05/24/2017 05/10/2017 04/29/2017  WBC 3.9 - 10.3 10e3/uL 3.3(L) 5.3 -  Hemoglobin 11.6 -  15.9 g/dL 11.1(L) 12.0 12.2  Hematocrit 34.8 - 46.6 % 33.6(L) 36.5 36.0  Platelets 145 - 400 10e3/uL 236 256 -    CMP Latest Ref Rng & Units 05/24/2017 05/10/2017 04/29/2017  Glucose 70 - 140 mg/dl 100 101 90  BUN 7.0 - 26.0 mg/dL 12.3 11.2 8  Creatinine 0.6 - 1.1 mg/dL 0.6 0.6 0.50  Sodium 136 - 145 mEq/L 136 139 136  Potassium 3.5 - 5.1 mEq/L 4.5 4.3 4.0  Chloride 101 - 111 mmol/L - - 98(L)  CO2 22 - 29 mEq/L 25 28 -  Calcium 8.4 - 10.4 mg/dL 8.9 8.9 -  Total Protein 6.4 - 8.3 g/dL 6.7 6.7 -  Total Bilirubin 0.20 - 1.20 mg/dL 0.25 <0.22 -  Alkaline Phos 40 - 150 U/L 55 47 -  AST 5 - 34 U/L 22 30 -  ALT 0 - 55 U/L 25 42 -    PATHOLOGY: Diagnosis 04/20/2017 Omentum, resection for tumor - METASTATIC CARCINOMA WITH FOCAL SIGNET RING CELL DIFFERENTIATION. - ONE BENIGN LYMPH NODE (0/1). - SEE COMMENT. Microscopic Comment Given the clinical history as well as the histologic findings, the features are consistent with metastatic gastric carcinoma. Additional studies can be performed upon clinician request. (JBK:ecj 04/21/2017)   FINAL MICROSCOPIC DIAGNOSIS 04/12/17 Stomach, Bx: ADENOCARCINOMA, HIGH GRADE, SIGNET RING CELL SUBTYPE, INVASIVE HER-2 NEU TESTING WILL BE SENT TO [facility] AND THE RESULT WILL BE FOLLOWED AS AN ADDEDUM, CYTO-7, CDX-2, ULAG-53 AND HELICOBACTER IP STAINS PENDING This case was discussed with Dr.  Paulita Fujita on 04/14/2017. PER DR. Paulita Fujita, THE LESION IS AN ULCERATED MASS, MALIGNANT APPEARANCE, CARCINOMA VS LYMHOMA, HE CALLED AND DISCUSSED THIS AT 04/13/2017 AND SPOKE WITH DR. Jacky Kindle M.D., Pathologyist (Case signed 04/14/2017)  PROCEDURES: Endoscopy by Dr. Paulita Fujita 04/12/17 The examined esophagus was normal. A large amount of food (residue) was found in the gastric fundus, in the gastric body, and in the gastric antrum. A large, fungating, infiltrative and ulcerated, non-circumferential mass with no bleeding was found in the prepyloric region of the stomach, extending to the pylorus distally and proximally along the lesser cure of the antrum and body. Mass more ulcer in appearance with heaped-up margins. Biopsies were taken with a cold forceps for histology. Tight pinhole pyloric channel, unable to transverse this area with the endoscope.   RADIOGRAPHIC STUDIES: I have personally reviewed the radiological images as listed and agreed with the findings in the report. No results found.  ASSESSMENT & PLAN: 41 y.o. [place] woman who presented with abdominal pain, nausea, and emesis for 4-5 months and significant weight loss  - Interpreter was present today and translated; patient accompanied by her husband.   1. Gastric adenocarcinoma, prepylori area, high grade, signet ring cell subtype, with peritoneal metastasis.  -The patient had an endoscopy by Dr. Paulita Fujita on 04/12/17 revealing a mass in the prepyloric region of the stomach. Biopsy of the mass revealed high grade adenocarcinoma of stomach. 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 Biopsy confirmed metastasis. Review of foundation one genomic testing results reveal unfortunately she does not have any targetable mutations. PDL 1 test results to pending, her tumor tissue was not sufficient for MSI testing.  It was previously discussed at last OV that surgery is not indicated now due to her  metastatic disease. However if she has great response to chemotherapy, I may refer her to Dr. Hartford Poli to see if she is a candidate for debulking and HIPEC  She is receiving  palliative chemotherapy with first-line FOLFOX. Her tumor HER2 was negative.   -The goal of therapy is palliative, to prolong her life and preserve her quality of life.  -She has gained some weight, clinically doing well, will proceed with cycle 2 of chemo today.- I again discussed limitations on foods due to chemo along with side effects. She should not eat anything cold due to cold sensitivity.   If she experiences more than what she anticipated, she knows to call - f/u and chemo in 2 weeks - repeat scan after 2-3 months of treatment - will continue treatment as long as it is effective and she is tolerating well  2. Abdominal pain, nausea and vomiting  -Secondary to her gastric cancer and pyloric outlet obstruction -She will continue omeprazole, and use Zofran, Reglan, and Phenergan as needed for nausea - she does feel like her pain is subsiding overall, and she had only one episode of emesis with her first cycle. She states she doesn't currently have abdominal pain, only intermittently and that it has been subsiding.   3. Anorexia and weight loss, moderate calorie and protein malnutrition  -The patient has over a 25 lb weight loss in the past 6 months and at her last OV she weighed 87lbs, today she is up to 94 lbs. This is secondary of her disease -She has previously had a poor appetite with her eating habits impacted by nausea, emesis, and abdominal pain. - patient now has a J-Tube and is on tube feeds, 4 cans a day, followed by home care service  -she has gained some weight lately and today reports feeling hungry. She is slowly eating more regular foods, and can tolerate small bites of rice and vietnamese noodles.  4. Genetics  -she does not have family history of malignancy.   5. Anemia  -she has mild anemia,  possible related to small bleeding from her gastric cancer and anemia in neoplastic disease, resolved now -will monitor her iron study   PLAN - proceed with cycle 2 FOLFOX today - f/u and chemo in 2 weeks   No orders of the defined types were placed in this encounter.   All questions were answered. The patient knows to call the clinic with any problems, questions or concerns. I spent 20 minutes counseling the patient face to face, by using the medial interpreting service. The total time spent in the appointment was 30 minutes and more than 50% was on counseling.     Bill Salinas, NP, AOCNP 05/24/2017 1:18 PM

## 2017-05-24 NOTE — Patient Instructions (Signed)
Cancer Center Discharge Instructions for Patients Receiving Chemotherapy  Today you received the following chemotherapy agents: Oxaliplatin, Leucovorin and Adrucil.   To help prevent nausea and vomiting after your treatment, we encourage you to take your nausea medication as directed.  If you develop nausea and vomiting that is not controlled by your nausea medication, call the clinic.   BELOW ARE SYMPTOMS THAT SHOULD BE REPORTED IMMEDIATELY:  *FEVER GREATER THAN 100.5 F  *CHILLS WITH OR WITHOUT FEVER  NAUSEA AND VOMITING THAT IS NOT CONTROLLED WITH YOUR NAUSEA MEDICATION  *UNUSUAL SHORTNESS OF BREATH  *UNUSUAL BRUISING OR BLEEDING  TENDERNESS IN MOUTH AND THROAT WITH OR WITHOUT PRESENCE OF ULCERS  *URINARY PROBLEMS  *BOWEL PROBLEMS  UNUSUAL RASH Items with * indicate a potential emergency and should be followed up as soon as possible.  Feel free to call the clinic you have any questions or concerns. The clinic phone number is [phone].  Please show the CHEMO ALERT CARD at check-in to the Emergency Department and triage nurse.   

## 2017-05-24 NOTE — Progress Notes (Signed)
Nutrition Follow-up:  Spoke with patient and husband with interpreter this pm during infusion.  Patient with gastric adenocarcinoma with peritoneal metastasis.  Followed by Dr. Burr Medico.  Husband reports that patient is tolerating tube feeding (osmolite 1.2) at rate of 61ml-60ml/hr for 24 hours per day on most days.  Reports that at times patient will complain of stomach pain so he will adjust rate down to 66ml/hr.  Patient reports that patient has been tolerating 55-30ml/hr for the last 7 days consistently for 24 hours.  Husband reports that he flushes tube with 66ml of water in the morning and 45ml of water in the evening.  Patient is tolerating bread, milk, soup (broth based with noodles, potato chips, cookies, water).  Patient reports that she has a stool about every other day and it is soft of loose but not diarrhea.     Medications: reglan, protonix  Labs: reviewed  Anthropometrics:   Weight has increased to 94 lb 3.2 oz today from 87.2 lb on 6/20.   Estimated Energy Needs  Kcals: 1500-1700 calories/d Protein: 60-80 g  Fluid: 1.7 L/d  NUTRITION DIAGNOSIS:  Severe malnutrition continues   INTERVENTION:  Encouraged patient to continue to eat orally to provide nutrition. Encouraged patient to drink fluids for hydration.   Recommend patient try and increase osmolite 1.2 to 31ml/hr for 20 hours to better receive all of nutrition (ie off pump for bathing, etc).  Patient and husband agreeable to try.  Discussed importance of patient trying to continue tube feeding for added nutrition to what patient is able to eat orally.   Current tube feeding regimen providing 1584 calories, 73 g of protein and 1063ml free water.     MONITORING, EVALUATION, GOAL: Patient will tolerate oral diet plus tube feeding advancement to meet 100% of estimated nutrition needs.    NEXT VISIT: Aug 1 during infusion  Jamyla Ard B. Zenia Resides, [place], [place] Registered Dietitian [phone] (pager)

## 2017-05-26 ENCOUNTER — Ambulatory Visit (HOSPITAL_BASED_OUTPATIENT_CLINIC_OR_DEPARTMENT_OTHER): Payer: Medicaid Other

## 2017-05-26 VITALS — BP 85/58 | HR 76 | Temp 97.7°F | Resp 18

## 2017-05-26 DIAGNOSIS — C163 Malignant neoplasm of pyloric antrum: Secondary | ICD-10-CM

## 2017-05-26 DIAGNOSIS — Z95828 Presence of other vascular implants and grafts: Secondary | ICD-10-CM

## 2017-05-26 DIAGNOSIS — Z452 Encounter for adjustment and management of vascular access device: Secondary | ICD-10-CM | POA: Diagnosis not present

## 2017-05-26 MED ORDER — SODIUM CHLORIDE 0.9% FLUSH
10.0000 mL | Freq: Once | INTRAVENOUS | Status: AC
  Administered 2017-05-26: 10 mL
  Filled 2017-05-26: qty 10

## 2017-05-26 MED ORDER — HEPARIN SOD (PORK) LOCK FLUSH 100 UNIT/ML IV SOLN
500.0000 [IU] | Freq: Once | INTRAVENOUS | Status: AC
  Administered 2017-05-26: 500 [IU]
  Filled 2017-05-26: qty 5

## 2017-05-26 NOTE — Patient Instructions (Signed)

## 2017-06-02 NOTE — Progress Notes (Signed)
[place]  [phone] [phone]  Clinic Follow up Note   Patient Care Team: Patient, No Pcp Per as PCP - General (General Practice) Patient, No Pcp Per (General Practice) Arta Silence, MD as Consulting Physician (Gastroenterology) Truitt Merle, MD as Consulting Physician (Hematology) Stark Klein, MD as Consulting Physician (General Surgery) 06/08/2017  CHIEF COMPLAINTS:  Adenocarcinoma of the stomach  Oncology History   Cancer Staging Gastric cancer [facility]) Staging form: Stomach, AJCC 8th Edition - Clinical stage from 04/12/2017: Stage IVB (cTX, cN0, cM1) - Signed by Truitt Merle, MD on 05/26/2017       Gastric cancer ([place])   04/12/2017 Procedure    Endoscopy by Dr. Paulita Fujita 04/12/17 A large, fungating, infiltrative and ulcerated, non-circumferential mass with no bleeding was found in the prepyloric region of the stomach, extending to the pylorus distally and proximally along the lesser cure of the antrum and body. Mass more ulcer in appearance with heaped-up margins. Tight pinhole pyloric channel, unable to transverse this area with the endoscope.      04/12/2017 Initial Biopsy    FINAL MICROSCOPIC DIAGNOSIS 04/12/17 Stomach, Bx: ADENOCARCINOMA, HIGH GRADE, SIGNET RING CELL SUBTYPE, INVASIVE      04/18/2017 Initial Diagnosis    Gastric cancer ([place])     04/18/2017 Miscellaneous    HER2 not amplified (IHC borderline, FISH her2/CEN17 ratio 1.4, copy number 3.15) MMR normal       04/20/2017 Pathology Results    Diagnosis Omentum, resection for tumor - METASTATIC CARCINOMA WITH FOCAL SIGNET RING CELL DIFFERENTIATION. - ONE BENIGN LYMPH NODE (0/1). - SEE COMMENT. Microscopic Comment Given the clinical history as well as the histologic findings, the features are consistent with metastatic gastric carcinoma. Additional studies can be performed upon clinician request. (JBK:ecj 04/21/2017)      04/20/2017 Miscellaneous    Foundation One genomic tests  showed postive mutation in ARID1A, MSI and mutation burden was not able to be evaluated.        04/20/2017 Miscellaneous    PD-L1 expression positve, with combined positive score 25 (>=1 is positve)       05/10/2017 -  Chemotherapy    first line chemo [id number], every 2 weeks          HISTORY OF PRESENTING ILLNESS (04/18/2017):  Claire Bray 41 y.o. female is here because of a new diagnosis of adenocarcinoma of the stomach.  The patient presented to the ED on 04/02/17 complaining of right lower abdominal pain that has been intermittent since January 2018. The pain worsened with an onset of emesis and diarrhea. The patient also had weight loss of approximately 25 lbs. Workup CT ABD/PEL showed significant gastroparesis, diffuse bowel dysmotility, and the small bowel was largely distended with fluid and air.  The patient presented to Deliah Goody, PA of [facility] on 04/11/17. By then,. On abdominal exam, a mass was palpated in the right middle and bilateral lower abdomen - questionable stool in the colon/instestines. The patient underwent an endoscopy by Dr. Paulita Fujita on 04/12/17. This revealed a large, fungating, infiltrative mass in the prepyloric region of the stomach extending to the pylorus distally and proximally along the lesser curve of the antrum and body. Biopsies were obtained. This revealed adenocarcinoma, high grade, signet ring cell subtype, invasive. Tumor marker testing is pending.  The patient has been referred today to discuss systemic treatment for the management of her disease. Her husband and two young children were present during the encounter. We had [employer] video  interpreter during this encounter who spoke [place].  She is only able to drink liquids; such as milk or soups. She has emesis when she eat solids. She is supplementing with Boost/Ensure with 1.5 - 2 a day. She reports less emesis with Ensure. The nausea is better is she does not eat anything. She reports her  stool is normal in color. The patient reports she lost 25 lbs in the last 6 months. She continues to have abdominal pain.  The patient reports she is not using anything for contraception at this time. She used Depo-Provera in the past.  CURRENT THERAPY: first line chemo [id number], every 2 weeks   INTERIM HISTORY:  Claire Bray is here for a follow-up. She presents to the clinic today accompanied by an interpreter and husband. She reports to being OK and last chemo was fine and did not have nausea, and her stool is slightly soft but not diarrhea. BM occurs every other day. She has no stomach issues. She has some pain around the feeding tube, no discharge. She is getting 4 bottles a day. She is also eating by mouth.  They usually feed continuously. They have not tried bolus. They do pause for a couple hours to go out. It does not hurt to eat by mouth now.  She feels like she has more energy and has not gone to work. Sometimes her feet will swell. The swelling will not last long. If she has energy increase she wants to know would she then be able to go back to eat. She needs help to apply for food stamps    MEDICAL HISTORY:  Past Medical History:  Diagnosis Date  . Cancer (HCC)    STOMACH  . No pertinent past medical history     SURGICAL HISTORY: Past Surgical History:  Procedure Laterality Date  . GASTROSTOMY N/A 04/20/2017   Procedure: INSERTION OF J TUBE;  Surgeon: Stark Klein, MD;  Location: [place];  Service: General;  Laterality: N/A;  . NO PAST SURGERIES    . PORTACATH PLACEMENT Left 04/20/2017   Procedure: INSERTION PORT-A-CATH;  Surgeon: Stark Klein, MD;  Location: [place];  Service: General;  Laterality: Left;    SOCIAL HISTORY: Social History   Social History  . Marital status: Married    Spouse name: N/A  . Number of children: N/A  . Years of education: N/A   Occupational History  . Not on file.   Social History Main Topics  . Smoking status: Never Smoker  .  Smokeless tobacco: Never Used  . Alcohol use No  . Drug use: No  . Sexual activity: Not Currently   Other Topics Concern  . Not on file   Social History Narrative   ** Merged History Encounter **       The patient has 5 siblings. She is the oldest. She is married with 2 children. She has an 93 yo son and a 20 yo daughter. 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 Her husband works for [employer].  FAMILY HISTORY: Family History  Problem Relation Age of Onset  . Hypertension Mother   . Heart disease Mother   . Other Neg Hx     ALLERGIES:  has No Known Allergies.  MEDICATIONS:  Current Outpatient Prescriptions  Medication Sig Dispense Refill  . lidocaine-prilocaine (EMLA) cream Apply to portacath site 1 hour prior to use 30 g 2  . Nutritional Supplements (FEEDING SUPPLEMENT, OSMOLITE 1.2 CAL,)  LIQD Place 1,000 mLs into feeding tube continuous. 40000 mL 11  . pantoprazole (PROTONIX) 40 MG tablet Take 1 tablet (40 mg total) by mouth daily at 12 noon. 30 tablet 3  . promethazine (PHENERGAN) 25 MG suppository Place 1 suppository (25 mg total) rectally every 6 (six) hours as needed for nausea. 12 each 0  . dicyclomine (BENTYL) 20 MG tablet Take 1 tablet (20 mg total) by mouth 2 (two) times daily. (Patient not taking: Reported on 06/08/2017) 20 tablet 0  . HYDROcodone-acetaminophen (HYCET) 7.5-325 mg/15 ml solution Take 10 mLs by mouth every 4 (four) hours as needed for moderate pain or severe pain. (Patient not taking: Reported on 06/08/2017) 120 mL 0  . ibuprofen (ADVIL,MOTRIN) 600 MG tablet Take 1 tablet (600 mg total) by mouth every 6 (six) hours. (Patient not taking: Reported on 04/18/2017) 60 tablet 0  . metoCLOPramide (REGLAN) 5 MG tablet Take 2 tablets (10 mg total) by mouth 4 (four) times daily -  before meals and at bedtime. (Patient not taking: Reported on 06/08/2017) 120 tablet 1  . ondansetron (ZOFRAN) 8 MG tablet Take 1 tablet (8 mg total) by mouth every 8 (eight) hours as  needed for nausea or vomiting. (Patient not taking: Reported on 04/18/2017) 30 tablet 0  . polyethylene glycol (MIRALAX) packet Take half a pack (8.5 oz) every 12 hours. Adjust as needed for soft stools (Patient not taking: Reported on 06/08/2017) 30 each 1  . zolpidem (AMBIEN) 5 MG tablet Take 1 tablet (5 mg total) by mouth at bedtime as needed for sleep. (Patient not taking: Reported on 04/18/2017) 5 tablet 0   No current facility-administered medications for this visit.     REVIEW OF SYSTEMS:   Constitutional: Denies fevers, chills or abnormal night sweats (+) Weight gain (+)pain around incision site Eyes: Denies blurriness of vision, double vision or watery eyes Ears, nose, mouth, throat, and face: Denies mucositis or sore throat Respiratory: Denies cough, dyspnea or wheezes Cardiovascular: Denies palpitation, chest discomfort or lower extremity swelling Gastrointestinal:  Denies heartburn or change in bowel habits Skin: Denies abnormal skin rashes Lymphatics: Denies new lymphadenopathy or easy bruising Neurological:Denies numbness, tingling or new weaknesses Behavioral/Psych: Mood is stable, no new changes  All other systems were reviewed with the patient and are negative.  PHYSICAL EXAMINATION:  ECOG PERFORMANCE STATUS: 1 - Symptomatic but completely ambulatory  Vitals:   06/08/17 0828  BP: 100/71  Pulse: 92  Resp: 20  Temp: 98.3 F (36.8 C)   Filed Weights   06/08/17 0828  Weight: 99 lb 1.6 oz (45 kg)    GENERAL:alert, no distress and comfortable, cachectic SKIN: skin color, texture, turgor are normal, no rashes or significant lesions EYES: normal, conjunctiva are pink and non-injected, sclera clear OROPHARYNX:no exudate, no erythema and lips, buccal mucosa, and tongue normal  NECK: supple, thyroid normal size, non-tender, without nodularity LYMPH:  no palpable lymphadenopathy in the cervical, axillary or inguinal LUNGS: clear to auscultation and percussion with normal  breathing effort HEART: regular rate & rhythm and no murmurs and no lower extremity edema ABDOMEN:abdomen soft and normal bowel sounds Musculoskeletal:no cyanosis of digits and no clubbing  PSYCH: alert & oriented x 3 with fluent speech NEURO: no focal motor/sensory deficits  LABORATORY DATA:  I have reviewed the data as listed CBC Latest Ref Rng & Units 06/08/2017 05/24/2017 05/10/2017  WBC 3.9 - 10.3 10e3/uL 3.7(L) 3.3(L) 5.3  Hemoglobin 11.6 - 15.9 g/dL 10.3(L) 11.1(L) 12.0  Hematocrit 34.8 - 46.6 %  32.1(L) 33.6(L) 36.5  Platelets 145 - 400 10e3/uL 180 236 256    CMP Latest Ref Rng & Units 06/08/2017 05/24/2017 05/10/2017  Glucose 70 - 140 mg/dl 97 100 101  BUN 7.0 - 26.0 mg/dL 11.9 12.3 11.2  Creatinine 0.6 - 1.1 mg/dL 0.6 0.6 0.6  Sodium 136 - 145 mEq/L 139 136 139  Potassium 3.5 - 5.1 mEq/L 4.2 4.5 4.3  Chloride 101 - 111 mmol/L - - -  CO2 22 - 29 mEq/L _0 Calcium 8.4 - 10.4 mg/dL 8.5 8.9 8.9  Total Protein 6.4 - 8.3 g/dL 6.2(L) 6.7 6.7  Total Bilirubin 0.20 - 1.20 mg/dL 0.28 0.25 <0.22  Alkaline Phos 40 - 150 U/L 56 55 47  AST 5 - 34 U/L 33 22 30  ALT 0 - 55 U/L 47 25 42    PATHOLOGY: Diagnosis 04/20/2017 Omentum, resection for tumor - METASTATIC CARCINOMA WITH FOCAL SIGNET RING CELL DIFFERENTIATION. - ONE BENIGN LYMPH NODE (0/1). - SEE COMMENT. Microscopic Comment Given the clinical history as well as the histologic findings, the features are consistent with metastatic gastric carcinoma. Additional studies can be performed upon clinician request. (JBK:ecj 04/21/2017)   FINAL MICROSCOPIC DIAGNOSIS 04/12/17 Stomach, Bx: ADENOCARCINOMA, HIGH GRADE, SIGNET RING CELL SUBTYPE, INVASIVE HER-2 NEU TESTING WILL BE SENT TO [facility] AND THE RESULT WILL BE FOLLOWED AS AN ADDEDUM, CYTO-7, CDX-2, YPPJ-09 AND HELICOBACTER IP STAINS PENDING This case was discussed with Dr. Paulita Fujita on 04/14/2017. PER DR. Paulita Fujita, THE LESION IS AN ULCERATED MASS, MALIGNANT APPEARANCE, CARCINOMA  VS LYMHOMA, HE CALLED AND DISCUSSED THIS AT 04/13/2017 AND SPOKE WITH DR. Jacky Kindle M.D., Pathologyist (Case signed 04/14/2017)  PROCEDURES: Endoscopy by Dr. Paulita Fujita 04/12/17 The examined esophagus was normal. A large amount of food (residue) was found in the gastric fundus, in the gastric body, and in the gastric antrum. A large, fungating, infiltrative and ulcerated, non-circumferential mass with no bleeding was found in the prepyloric region of the stomach, extending to the pylorus distally and proximally along the lesser cure of the antrum and body. Mass more ulcer in appearance with heaped-up margins. Biopsies were taken with a cold forceps for histology. Tight pinhole pyloric channel, unable to transverse this area with the endoscope.   RADIOGRAPHIC STUDIES: I have personally reviewed the radiological images as listed and agreed with the findings in the report. No results found.  CT CHEST W CONTRAST 04/20/17 IMPRESSION: No evidence of metastatic disease in the chest. No active pulmonary disease. Visualized proximal stomach is distended with fluid and debris.  ASSESSMENT & PLAN: 41 y.o. [place] woman who presented with abdominal pain, nausea, and emesis for 4-5 months and significant weight loss   1. Gastric adenocarcinoma, prepylori area, high grade, signet ring cell subtype, with peritoneal metastasis, PD-L1 (+) -I reviewed her CT scan, endoscopy finding and the biopsy results with patient and her husband in great details, through a vedio interpreter.  -The patient had an endoscopy by Dr. Paulita Fujita on 04/12/17 revealing a mass in the prepyloric region of the stomach. Biopsy of the mass revealed high grade adenocarcinoma of stomach. 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 Biopsy confirmed metastasis. --I recommend palliative chemotherapy with first-line FOLFOX. Her tumor HER2 was negative.  -I reviewed her Foundation one  genomic testing results, unfortunately she does not have any targetable mutations. PDL 1 test results to pending, her tumor tissue was not sufficient for MSI testing. I'll try to obtain her initial gastric  mass biopsy tissue for the MSI testing.  -We discussed her that surgery is not indicated now due to her metastatic disease. However if she has great response to chemotherapy, I may refer her to Dr. Hartford Poli to see if she is a candidate for debulking and HIPEC -She has started first-line chemotherapy FOLFOX on 05/10/17, tolerating very well. -I reviewed her Foundation one genomic testing results, unfortunately there is no tonsillar therapy available. -Her tumor was tested positive for PDL 1 expression, she is a candidate for immunotherapy as a second line. I discussed and reviewed with her in details. -She is on continuous feeding tube, tolerating well, has gained weight, overall feels better. She's also eating soft and liquid diet, no nausea or vomiting. -We discussed that she may be able to eat more if her gastric tumor shrinks after chemotherapy, in that case we can reduce her her tube feeds, and she may be able to return to work. -Will repeat scan in 07/2017, will continue chemo every 2 weeks, so far she is tolerating very well.  -F/u in 4 weeks   2. Abdominal pain, nausea and vomiting  -Secondary to her gastric cancer and pyloric outlet obstruction -She will continue omeprazole, and use Zofran, Reglan, and Phenergan as needed for nausea  -resolved now   3. Anorexia and weight loss, moderate calorie and protein malnutrition  -The patient has over a 25 lb weight loss in the past 6 months and she currently weighs 87 lbs. This is secondary of her disease -She has a poor appetite with her eating habits impacted by nausea, emesis, and abdominal pain. - patient now has a J-Tube and is on tube feeds, 4 cans a day, followed by home care service  -she has gained some weight lately  -I discussed that when  she is able to gain her weight and can eat by mouth, she can remove her feeding tube. It will help to let the chemo shrink her tumor so she is able to eat by mouth more.  -I suggest she keep a soft and liquid diet of well cook food to eat by mouth  4. Genetics  -she does not have family history of malignancy. -Due to her young age, I'll ask our genetic counselor to see if she meets the criteria for genetic testing. -She is agreeable with genetic referral if needed.  5. Anemia  -she has mild anemia, possible related to small bleeding from her gastric cancer and anemia in neoplastic disease, resolved now -will monitor her iron study   6. Goal of care discussion  -We again discussed the incurable nature of her cancer, and the overall poor prognosis, especially if she does not have good response to chemotherapy or progress on chemo -The patient understands the goal of care is palliative. -I recommend DNR/DNI, she will think about it   7. Financial Help -Pt has Medicaid -They want to know can they get help applying for food stamps -will refer to social work   PLAN -Lab reviewed, adequate for treatment, we'll proceed to cycle 3 FOLFOX today, and continue every 2 weeks. -F/u in 4 weeks -refer to genetics and social worker  -Plan to order restaging CT scan after cycle 5 of chemotherapy.   No orders of the defined types were placed in this encounter.   All questions were answered. The patient knows to call the clinic with any problems, questions or concerns. I spent 30 minutes counseling the patient face to face, by using the medial interpreting service.  The total time spent in the appointment was 40 minutes and more than 50% was on counseling.      Truitt Merle, MD 06/08/2017 6:04 PM  This document serves as a record of services personally performed by Truitt Merle, MD. It was created on her behalf by Joslyn Devon, a trained medical scribe. The creation of this record is based on the  scribe's personal observations and the provider's statements to them. This document has been checked and approved by the attending provider.

## 2017-06-08 ENCOUNTER — Encounter: Payer: Self-pay | Admitting: *Deleted

## 2017-06-08 ENCOUNTER — Encounter: Payer: Self-pay | Admitting: Hematology

## 2017-06-08 ENCOUNTER — Ambulatory Visit (HOSPITAL_BASED_OUTPATIENT_CLINIC_OR_DEPARTMENT_OTHER): Payer: Medicaid Other | Admitting: Hematology

## 2017-06-08 ENCOUNTER — Ambulatory Visit (HOSPITAL_BASED_OUTPATIENT_CLINIC_OR_DEPARTMENT_OTHER): Payer: Medicaid Other

## 2017-06-08 ENCOUNTER — Other Ambulatory Visit (HOSPITAL_BASED_OUTPATIENT_CLINIC_OR_DEPARTMENT_OTHER): Payer: Medicaid Other

## 2017-06-08 VITALS — BP 100/71 | HR 92 | Temp 98.3°F | Resp 20 | Ht 64.0 in | Wt 99.1 lb

## 2017-06-08 DIAGNOSIS — E440 Moderate protein-calorie malnutrition: Secondary | ICD-10-CM

## 2017-06-08 DIAGNOSIS — E43 Unspecified severe protein-calorie malnutrition: Secondary | ICD-10-CM | POA: Diagnosis not present

## 2017-06-08 DIAGNOSIS — C786 Secondary malignant neoplasm of retroperitoneum and peritoneum: Secondary | ICD-10-CM | POA: Diagnosis not present

## 2017-06-08 DIAGNOSIS — R630 Anorexia: Secondary | ICD-10-CM | POA: Diagnosis not present

## 2017-06-08 DIAGNOSIS — C169 Malignant neoplasm of stomach, unspecified: Secondary | ICD-10-CM

## 2017-06-08 DIAGNOSIS — C163 Malignant neoplasm of pyloric antrum: Secondary | ICD-10-CM

## 2017-06-08 DIAGNOSIS — C162 Malignant neoplasm of body of stomach: Secondary | ICD-10-CM

## 2017-06-08 DIAGNOSIS — Z5111 Encounter for antineoplastic chemotherapy: Secondary | ICD-10-CM | POA: Diagnosis present

## 2017-06-08 DIAGNOSIS — C801 Malignant (primary) neoplasm, unspecified: Secondary | ICD-10-CM

## 2017-06-08 DIAGNOSIS — D630 Anemia in neoplastic disease: Secondary | ICD-10-CM

## 2017-06-08 DIAGNOSIS — Z95828 Presence of other vascular implants and grafts: Secondary | ICD-10-CM

## 2017-06-08 LAB — CBC WITH DIFFERENTIAL/PLATELET
BASO%: 0.3 % (ref 0.0–2.0)
Basophils Absolute: 0 10*3/uL (ref 0.0–0.1)
EOS%: 2.4 % (ref 0.0–7.0)
Eosinophils Absolute: 0.1 10*3/uL (ref 0.0–0.5)
HEMATOCRIT: 32.1 % — AB (ref 34.8–46.6)
HGB: 10.3 g/dL — ABNORMAL LOW (ref 11.6–15.9)
LYMPH#: 1.2 10*3/uL (ref 0.9–3.3)
LYMPH%: 31 % (ref 14.0–49.7)
MCH: 29.3 pg (ref 25.1–34.0)
MCHC: 32.1 g/dL (ref 31.5–36.0)
MCV: 91.5 fL (ref 79.5–101.0)
MONO#: 0.5 10*3/uL (ref 0.1–0.9)
MONO%: 12.8 % (ref 0.0–14.0)
NEUT%: 53.5 % (ref 38.4–76.8)
NEUTROS ABS: 2 10*3/uL (ref 1.5–6.5)
PLATELETS: 180 10*3/uL (ref 145–400)
RBC: 3.51 10*6/uL — ABNORMAL LOW (ref 3.70–5.45)
RDW: 16.9 % — ABNORMAL HIGH (ref 11.2–14.5)
WBC: 3.7 10*3/uL — ABNORMAL LOW (ref 3.9–10.3)

## 2017-06-08 LAB — COMPREHENSIVE METABOLIC PANEL
ALT: 47 U/L (ref 0–55)
AST: 33 U/L (ref 5–34)
Albumin: 3.1 g/dL — ABNORMAL LOW (ref 3.5–5.0)
Alkaline Phosphatase: 56 U/L (ref 40–150)
Anion Gap: 6 mEq/L (ref 3–11)
BUN: 11.9 mg/dL (ref 7.0–26.0)
CALCIUM: 8.5 mg/dL (ref 8.4–10.4)
CHLORIDE: 106 meq/L (ref 98–109)
CO2: 27 mEq/L (ref 22–29)
CREATININE: 0.6 mg/dL (ref 0.6–1.1)
EGFR: >90 mL/min/{1.73_m2} (ref >90)
GLUCOSE: 97 mg/dL (ref 70–140)
Potassium: 4.2 mEq/L (ref 3.5–5.1)
Sodium: 139 mEq/L (ref 136–145)
Total Bilirubin: 0.28 mg/dL (ref 0.20–1.20)
Total Protein: 6.2 g/dL — ABNORMAL LOW (ref 6.4–8.3)

## 2017-06-08 MED ORDER — PALONOSETRON HCL INJECTION 0.25 MG/5ML
0.2500 mg | Freq: Once | INTRAVENOUS | Status: AC
  Administered 2017-06-08: 0.25 mg via INTRAVENOUS

## 2017-06-08 MED ORDER — LEUCOVORIN CALCIUM INJECTION 350 MG
400.0000 mg/m2 | Freq: Once | INTRAVENOUS | Status: AC
  Administered 2017-06-08: 540 mg via INTRAVENOUS
  Filled 2017-06-08: qty 27

## 2017-06-08 MED ORDER — FLUOROURACIL CHEMO INJECTION 2.5 GM/50ML
400.0000 mg/m2 | Freq: Once | INTRAVENOUS | Status: AC
  Administered 2017-06-08: 550 mg via INTRAVENOUS
  Filled 2017-06-08: qty 11

## 2017-06-08 MED ORDER — PALONOSETRON HCL INJECTION 0.25 MG/5ML
INTRAVENOUS | Status: AC
  Filled 2017-06-08: qty 5

## 2017-06-08 MED ORDER — DEXAMETHASONE SODIUM PHOSPHATE 10 MG/ML IJ SOLN
INTRAMUSCULAR | Status: AC
  Filled 2017-06-08: qty 1

## 2017-06-08 MED ORDER — DEXAMETHASONE SODIUM PHOSPHATE 10 MG/ML IJ SOLN
10.0000 mg | Freq: Once | INTRAMUSCULAR | Status: AC
  Administered 2017-06-08: 10 mg via INTRAVENOUS

## 2017-06-08 MED ORDER — SODIUM CHLORIDE 0.9% FLUSH
10.0000 mL | Freq: Once | INTRAVENOUS | Status: AC
  Administered 2017-06-08: 10 mL
  Filled 2017-06-08: qty 10

## 2017-06-08 MED ORDER — DEXTROSE 5 % IV SOLN
Freq: Once | INTRAVENOUS | Status: AC
  Administered 2017-06-08: 10:00:00 via INTRAVENOUS

## 2017-06-08 MED ORDER — OXALIPLATIN CHEMO INJECTION 100 MG/20ML
85.0000 mg/m2 | Freq: Once | INTRAVENOUS | Status: AC
  Administered 2017-06-08: 115 mg via INTRAVENOUS
  Filled 2017-06-08: qty 3

## 2017-06-08 MED ORDER — SODIUM CHLORIDE 0.9 % IV SOLN
2400.0000 mg/m2 | INTRAVENOUS | Status: DC
  Administered 2017-06-08: 3250 mg via INTRAVENOUS
  Filled 2017-06-08: qty 65

## 2017-06-08 NOTE — Patient Instructions (Signed)
[facility] Discharge Instructions for Patients Receiving Chemotherapy  Today you received the following chemotherapy agents: Oxaliplatin and Leucovorin  To help prevent nausea and vomiting after your treatment, we encourage you to take your nausea medication as directed.   If you develop nausea and vomiting that is not controlled by your nausea medication, call the clinic.   BELOW ARE SYMPTOMS THAT SHOULD BE REPORTED IMMEDIATELY:  *FEVER GREATER THAN 100.5 F  *CHILLS WITH OR WITHOUT FEVER  NAUSEA AND VOMITING THAT IS NOT CONTROLLED WITH YOUR NAUSEA MEDICATION  *UNUSUAL SHORTNESS OF BREATH  *UNUSUAL BRUISING OR BLEEDING  TENDERNESS IN MOUTH AND THROAT WITH OR WITHOUT PRESENCE OF ULCERS  *URINARY PROBLEMS  *BOWEL PROBLEMS  UNUSUAL RASH Items with * indicate a potential emergency and should be followed up as soon as possible.  Feel free to call the clinic you have any questions or concerns. The clinic phone number is [phone].  Please show the CHEMO ALERT CARD at check-in to the Emergency Department and triage nurse.   

## 2017-06-08 NOTE — Progress Notes (Signed)
Nutrition Follow-up:  Met with patient and husband during infusion this am.  Interpreter not with patient today.  Patient with gastric adenocarcinoma with peritoneal metastasis. Followed by Dr. Burr Medico.   Husband reports that patient is tolerating osmolite 1.2 at 90m/hr for about 24 hours most days of the week.  Husband reports patient has had 1 episode of nausea over the past several weeks.  Husband reports that wife is having a soft bowel movement usually 1 every day.  He flushes tube with 622mof water BID.  Patient is eating orally potato chips, soup, noodles, liquids, ensure 1 per day)  Medications: reviewed  Labs: reviewed  Anthropometrics:   Weight has increased today to 99 lb 1.6 oz from 94 lb 3.2 oz on 7/17.   UBW is 110-115 lb   Estimated Energy Needs  Kcals: 1500-1700 calories/d Protein: 60-80 g/d  Fluid: 1.7 L/d  NUTRITION DIAGNOSIS: Severe malnutrition improving    INTERVENTION:   Recommend osmolite 1.2 at 6671mr for 20 hours to allow patient to be off the pump for ADL.  This will provide 1584 calories, 73 g of protein and 1082m54mee water.  Husband verbalized understanding.   Encouraged patient to continue to eat foods orally and drink liquids to maintain hydration Encouraged patient to continue to drink ensure for added nutrition Spoke with Dr. FengBurr Medico patient asking MD when tube can be taking out. Dr. FengBurr Medicoting to continue tube feeding at this time until chemo is able to shrink tumor and allow patient to eat more orally.      MONITORING, EVALUATION, GOAL: Patient will tolerate oral diet plus tube feeding advancement to meet 100% of estimated nutrition needs.     NEXT VISIT: Wednesday, August 29 during infusion  Taiwan Millon B. AlleZenia Resides, [place] Eckhart Minesistered Dietitian [phone])

## 2017-06-08 NOTE — Progress Notes (Signed)
Blood return noted before and after Adrucil push.  

## 2017-06-08 NOTE — Patient Instructions (Signed)
Implanted Port Home Guide An implanted port is a type of central line that is placed under the skin. Central lines are used to provide IV access when treatment or nutrition needs to be given through a person's veins. Implanted ports are used for long-term IV access. An implanted port may be placed because:  You need IV medicine that would be irritating to the small veins in your hands or arms.  You need long-term IV medicines, such as antibiotics.  You need IV nutrition for a long period.  You need frequent blood draws for lab tests.  You need dialysis.  Implanted ports are usually placed in the chest area, but they can also be placed in the upper arm, the abdomen, or the leg. An implanted port has two main parts:  Reservoir. The reservoir is round and will appear as a small, raised area under your skin. The reservoir is the part where a needle is inserted to give medicines or draw blood.  Catheter. The catheter is a thin, flexible tube that extends from the reservoir. The catheter is placed into a large vein. Medicine that is inserted into the reservoir goes into the catheter and then into the vein.  How will I care for my incision site? Do not get the incision site wet. Bathe or shower as directed by your health care provider. How is my port accessed? Special steps must be taken to access the port:  Before the port is accessed, a numbing cream can be placed on the skin. This helps numb the skin over the port site.  Your health care provider uses a sterile technique to access the port. ? Your health care provider must put on a mask and sterile gloves. ? The skin over your port is cleaned carefully with an antiseptic and allowed to dry. ? The port is gently pinched between sterile gloves, and a needle is inserted into the port.  Only "non-coring" port needles should be used to access the port. Once the port is accessed, a blood return should be checked. This helps ensure that the port  is in the vein and is not clogged.  If your port needs to remain accessed for a constant infusion, a clear (transparent) bandage will be placed over the needle site. The bandage and needle will need to be changed every week, or as directed by your health care provider.  Keep the bandage covering the needle clean and dry. Do not get it wet. Follow your health care provider's instructions on how to take a shower or bath while the port is accessed.  If your port does not need to stay accessed, no bandage is needed over the port.  What is flushing? Flushing helps keep the port from getting clogged. Follow your health care provider's instructions on how and when to flush the port. Ports are usually flushed with saline solution or a medicine called heparin. The need for flushing will depend on how the port is used.  If the port is used for intermittent medicines or blood draws, the port will need to be flushed: ? After medicines have been given. ? After blood has been drawn. ? As part of routine maintenance.  If a constant infusion is running, the port may not need to be flushed.  How long will my port stay implanted? The port can stay in for as long as your health care provider thinks it is needed. When it is time for the port to come out, surgery will be   done to remove it. The procedure is similar to the one performed when the port was put in. When should I seek immediate medical care? When you have an implanted port, you should seek immediate medical care if:  You notice a bad smell coming from the incision site.  You have swelling, redness, or drainage at the incision site.  You have more swelling or pain at the port site or the surrounding area.  You have a fever that is not controlled with medicine.  This information is not intended to replace advice given to you by your health care provider. Make sure you discuss any questions you have with your health care provider. Document  Released: 10/25/2005 Document Revised: 04/01/2016 Document Reviewed: 07/02/2013 Elsevier Interactive Patient Education  2017 [employer]  

## 2017-06-08 NOTE — Progress Notes (Signed)
[place] Work  Clinical Social Work was referred by  Futures trader for assessment of psychosocial needs due to request for assistance with food stamps.  Clinical Social Worker reviewed chart and met with patient and her husband at [facility] during infusion to offer support and assess for needs. CSW provided pt and husband with food stamp application process documents, where to go to apply, food pantry list and free meal list. Pt is already receiving ss disability and medicaid. Husband reports children also have medicaid and they should qualify. CSW also provided family with food bag from [employer].  CSW also encouraged pt and husband to apply for [facility] grant to assist with other financial needs. CSW will also alert financial counselor about their needs. CSW team to follow and assist accordingly.   Clinical Social Work interventions:  Resource education and referral  Loren Racer, LCSW, OSW-C Clinical Social Worker [place]  [place] Phone: [phone] Fax: [phone]

## 2017-06-09 NOTE — Addendum Note (Signed)
Addended by: Truitt Merle on: 06/09/2017 08:34 AM   Modules accepted: Orders

## 2017-06-10 ENCOUNTER — Ambulatory Visit (HOSPITAL_BASED_OUTPATIENT_CLINIC_OR_DEPARTMENT_OTHER): Payer: Medicaid Other

## 2017-06-10 ENCOUNTER — Encounter: Payer: Self-pay | Admitting: Hematology

## 2017-06-10 VITALS — BP 90/57 | HR 87 | Temp 97.9°F | Resp 18

## 2017-06-10 DIAGNOSIS — C163 Malignant neoplasm of pyloric antrum: Secondary | ICD-10-CM

## 2017-06-10 MED ORDER — HEPARIN SOD (PORK) LOCK FLUSH 100 UNIT/ML IV SOLN
500.0000 [IU] | Freq: Once | INTRAVENOUS | Status: AC | PRN
  Administered 2017-06-10: 500 [IU]
  Filled 2017-06-10: qty 5

## 2017-06-10 MED ORDER — SODIUM CHLORIDE 0.9% FLUSH
10.0000 mL | INTRAVENOUS | Status: DC | PRN
  Administered 2017-06-10: 10 mL
  Filled 2017-06-10: qty 10

## 2017-06-10 NOTE — Progress Notes (Signed)
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  I went over what it covers and gave her an expense sheet.  She would like to apply so since she's not working her husband will bring his proof of income on 07/06/17.  They have my card for any questions or concerns she may have in the future.

## 2017-06-17 ENCOUNTER — Telehealth: Payer: Self-pay | Admitting: Hematology

## 2017-06-17 NOTE — Telephone Encounter (Signed)
Scheduled appt per 8/8 sch message - sent reminder letter in the mail.

## 2017-06-22 ENCOUNTER — Encounter: Payer: Medicaid Other | Admitting: Nutrition

## 2017-06-22 ENCOUNTER — Other Ambulatory Visit: Payer: Medicaid Other

## 2017-06-22 ENCOUNTER — Ambulatory Visit: Payer: Medicaid Other

## 2017-06-22 ENCOUNTER — Ambulatory Visit: Payer: Medicaid Other | Admitting: Hematology

## 2017-06-28 ENCOUNTER — Encounter: Payer: Medicaid Other | Admitting: Genetics

## 2017-06-28 ENCOUNTER — Other Ambulatory Visit: Payer: Medicaid Other

## 2017-07-06 ENCOUNTER — Ambulatory Visit: Payer: Medicaid Other | Admitting: Nutrition

## 2017-07-06 ENCOUNTER — Encounter: Payer: Medicaid Other | Admitting: Nutrition

## 2017-07-06 ENCOUNTER — Ambulatory Visit (HOSPITAL_BASED_OUTPATIENT_CLINIC_OR_DEPARTMENT_OTHER): Payer: Medicaid Other

## 2017-07-06 ENCOUNTER — Encounter: Payer: Self-pay | Admitting: Hematology

## 2017-07-06 ENCOUNTER — Other Ambulatory Visit (HOSPITAL_BASED_OUTPATIENT_CLINIC_OR_DEPARTMENT_OTHER): Payer: Medicaid Other

## 2017-07-06 ENCOUNTER — Other Ambulatory Visit (HOSPITAL_COMMUNITY)
Admission: RE | Admit: 2017-07-06 | Discharge: 2017-07-06 | Disposition: A | Discharge status: Home / Self Care | Payer: Medicaid Other | Source: Ambulatory Visit | Attending: Hematology | Admitting: Hematology

## 2017-07-06 ENCOUNTER — Ambulatory Visit (HOSPITAL_BASED_OUTPATIENT_CLINIC_OR_DEPARTMENT_OTHER): Payer: Medicaid Other | Admitting: Hematology

## 2017-07-06 VITALS — BP 124/90 | HR 92 | Temp 98.0°F | Resp 18 | Ht 64.0 in | Wt 103.1 lb

## 2017-07-06 DIAGNOSIS — R197 Diarrhea, unspecified: Secondary | ICD-10-CM

## 2017-07-06 DIAGNOSIS — C169 Malignant neoplasm of stomach, unspecified: Secondary | ICD-10-CM

## 2017-07-06 DIAGNOSIS — Z95828 Presence of other vascular implants and grafts: Secondary | ICD-10-CM

## 2017-07-06 DIAGNOSIS — D630 Anemia in neoplastic disease: Secondary | ICD-10-CM

## 2017-07-06 DIAGNOSIS — C163 Malignant neoplasm of pyloric antrum: Secondary | ICD-10-CM

## 2017-07-06 DIAGNOSIS — R630 Anorexia: Secondary | ICD-10-CM | POA: Diagnosis not present

## 2017-07-06 DIAGNOSIS — C786 Secondary malignant neoplasm of retroperitoneum and peritoneum: Secondary | ICD-10-CM | POA: Diagnosis not present

## 2017-07-06 DIAGNOSIS — C801 Malignant (primary) neoplasm, unspecified: Secondary | ICD-10-CM

## 2017-07-06 DIAGNOSIS — R634 Abnormal weight loss: Secondary | ICD-10-CM

## 2017-07-06 DIAGNOSIS — R109 Unspecified abdominal pain: Secondary | ICD-10-CM | POA: Diagnosis not present

## 2017-07-06 DIAGNOSIS — E440 Moderate protein-calorie malnutrition: Secondary | ICD-10-CM | POA: Diagnosis not present

## 2017-07-06 DIAGNOSIS — Z5111 Encounter for antineoplastic chemotherapy: Secondary | ICD-10-CM | POA: Diagnosis present

## 2017-07-06 DIAGNOSIS — C162 Malignant neoplasm of body of stomach: Secondary | ICD-10-CM

## 2017-07-06 LAB — CBC WITH DIFFERENTIAL/PLATELET
BASO%: 0.1 % (ref 0.0–2.0)
BASOS ABS: 0 10*3/uL (ref 0.0–0.1)
EOS ABS: 0.1 10*3/uL (ref 0.0–0.5)
EOS%: 0.8 % (ref 0.0–7.0)
HCT: 35.2 % (ref 34.8–46.6)
HGB: 11.3 g/dL — ABNORMAL LOW (ref 11.6–15.9)
LYMPH%: 15.7 % (ref 14.0–49.7)
MCH: 29.9 pg (ref 25.1–34.0)
MCHC: 32.1 g/dL (ref 31.5–36.0)
MCV: 93.1 fL (ref 79.5–101.0)
MONO#: 1 10*3/uL — AB (ref 0.1–0.9)
MONO%: 10.9 % (ref 0.0–14.0)
NEUT#: 6.6 10*3/uL — ABNORMAL HIGH (ref 1.5–6.5)
NEUT%: 72.5 % (ref 38.4–76.8)
PLATELETS: 203 10*3/uL (ref 145–400)
RBC: 3.78 10*6/uL (ref 3.70–5.45)
RDW: 16.6 % — ABNORMAL HIGH (ref 11.2–14.5)
WBC: 9.2 10*3/uL (ref 3.9–10.3)
lymph#: 1.4 10*3/uL (ref 0.9–3.3)

## 2017-07-06 LAB — COMPREHENSIVE METABOLIC PANEL
ALT: 98 U/L — ABNORMAL HIGH (ref 0–55)
ANION GAP: 7 meq/L (ref 3–11)
AST: 73 U/L — ABNORMAL HIGH (ref 5–34)
Albumin: 3.4 g/dL — ABNORMAL LOW (ref 3.5–5.0)
Alkaline Phosphatase: 76 U/L (ref 40–150)
BILIRUBIN TOTAL: 0.24 mg/dL (ref 0.20–1.20)
BUN: 9.6 mg/dL (ref 7.0–26.0)
CALCIUM: 9.2 mg/dL (ref 8.4–10.4)
CO2: 27 meq/L (ref 22–29)
Chloride: 104 mEq/L (ref 98–109)
Creatinine: 0.6 mg/dL (ref 0.6–1.1)
Glucose: 115 mg/dl (ref 70–140)
Potassium: 4 mEq/L (ref 3.5–5.1)
SODIUM: 138 meq/L (ref 136–145)
TOTAL PROTEIN: 7.5 g/dL (ref 6.4–8.3)

## 2017-07-06 LAB — PREGNANCY, URINE: Preg Test, Ur: NEGATIVE

## 2017-07-06 LAB — FERRITIN: Ferritin: 32 ng/ml (ref 9–269)

## 2017-07-06 MED ORDER — DEXAMETHASONE SODIUM PHOSPHATE 10 MG/ML IJ SOLN
10.0000 mg | Freq: Once | INTRAMUSCULAR | Status: AC
  Administered 2017-07-06: 10 mg via INTRAVENOUS

## 2017-07-06 MED ORDER — SODIUM CHLORIDE 0.9% FLUSH
10.0000 mL | Freq: Once | INTRAVENOUS | Status: AC
  Administered 2017-07-06: 10 mL
  Filled 2017-07-06: qty 10

## 2017-07-06 MED ORDER — OXALIPLATIN CHEMO INJECTION 100 MG/20ML
85.0000 mg/m2 | Freq: Once | INTRAVENOUS | Status: AC
  Administered 2017-07-06: 115 mg via INTRAVENOUS
  Filled 2017-07-06: qty 3

## 2017-07-06 MED ORDER — SODIUM CHLORIDE 0.9 % IV SOLN
2400.0000 mg/m2 | INTRAVENOUS | Status: DC
  Administered 2017-07-06: 3250 mg via INTRAVENOUS
  Filled 2017-07-06: qty 65

## 2017-07-06 MED ORDER — DEXTROSE 5 % IV SOLN
Freq: Once | INTRAVENOUS | Status: AC
  Administered 2017-07-06: 13:00:00 via INTRAVENOUS

## 2017-07-06 MED ORDER — PALONOSETRON HCL INJECTION 0.25 MG/5ML
0.2500 mg | Freq: Once | INTRAVENOUS | Status: AC
  Administered 2017-07-06: 0.25 mg via INTRAVENOUS

## 2017-07-06 MED ORDER — FLUOROURACIL CHEMO INJECTION 2.5 GM/50ML
400.0000 mg/m2 | Freq: Once | INTRAVENOUS | Status: AC
  Administered 2017-07-06: 550 mg via INTRAVENOUS
  Filled 2017-07-06: qty 11

## 2017-07-06 MED ORDER — PALONOSETRON HCL INJECTION 0.25 MG/5ML
INTRAVENOUS | Status: AC
  Filled 2017-07-06: qty 5

## 2017-07-06 MED ORDER — LEUCOVORIN CALCIUM INJECTION 350 MG
400.0000 mg/m2 | Freq: Once | INTRAVENOUS | Status: AC
  Administered 2017-07-06: 540 mg via INTRAVENOUS
  Filled 2017-07-06: qty 27

## 2017-07-06 NOTE — Progress Notes (Addendum)
[place]  [phone] [phone]  Clinic Follow up Note   Patient Care Team: Patient, No Pcp Per as PCP - General (General Practice) Patient, No Pcp Per (General Practice) Arta Silence, MD as Consulting Physician (Gastroenterology) Truitt Merle, MD as Consulting Physician (Hematology) Stark Klein, MD as Consulting Physician (General Surgery) 07/06/2017  CHIEF COMPLAINTS:  Adenocarcinoma of the stomach  Oncology History   Cancer Staging Gastric cancer [facility]) Staging form: Stomach, AJCC 8th Edition - Clinical stage from 04/12/2017: Stage IVB (cTX, cN0, cM1) - Signed by Truitt Merle, MD on 05/26/2017       Gastric cancer ([place])   04/12/2017 Procedure    Endoscopy by Dr. Paulita Fujita 04/12/17 A large, fungating, infiltrative and ulcerated, non-circumferential mass with no bleeding was found in the prepyloric region of the stomach, extending to the pylorus distally and proximally along the lesser cure of the antrum and body. Mass more ulcer in appearance with heaped-up margins. Tight pinhole pyloric channel, unable to transverse this area with the endoscope.      04/12/2017 Initial Biopsy    FINAL MICROSCOPIC DIAGNOSIS 04/12/17 Stomach, Bx: ADENOCARCINOMA, HIGH GRADE, SIGNET RING CELL SUBTYPE, INVASIVE      04/18/2017 Initial Diagnosis    Gastric cancer ([place])     04/18/2017 Miscellaneous    HER2 not amplified (IHC borderline, FISH her2/CEN17 ratio 1.4, copy number 3.15) MMR normal       04/20/2017 Pathology Results    Diagnosis Omentum, resection for tumor - METASTATIC CARCINOMA WITH FOCAL SIGNET RING CELL DIFFERENTIATION. - ONE BENIGN LYMPH NODE (0/1). - SEE COMMENT. Microscopic Comment Given the clinical history as well as the histologic findings, the features are consistent with metastatic gastric carcinoma. Additional studies can be performed upon clinician request. (JBK:ecj 04/21/2017)      04/20/2017 Miscellaneous    Foundation One genomic  tests showed postive mutation in ARID1A, MSI and mutation burden was not able to be evaluated.        04/20/2017 Miscellaneous    PD-L1 expression positve, with combined positive score 25 (>=1 is positve)       05/10/2017 -  Chemotherapy    first line chemo mFOLFOX6, every 2 weeks          HISTORY OF PRESENTING ILLNESS (04/18/2017):  Claire Bray 41 y.o. female is here because of a new diagnosis of adenocarcinoma of the stomach.  The patient presented to the ED on 04/02/17 complaining of right lower abdominal pain that has been intermittent since January 2018. The pain worsened with an onset of emesis and diarrhea. The patient also had weight loss of approximately 25 lbs. Workup CT ABD/PEL showed significant gastroparesis, diffuse bowel dysmotility, and the small bowel was largely distended with fluid and air.  The patient presented to Deliah Goody, PA of [facility] on 04/11/17. By then,. On abdominal exam, a mass was palpated in the right middle and bilateral lower abdomen - questionable stool in the colon/instestines. The patient underwent an endoscopy by Dr. Paulita Fujita on 04/12/17. This revealed a large, fungating, infiltrative mass in the prepyloric region of the stomach extending to the pylorus distally and proximally along the lesser curve of the antrum and body. Biopsies were obtained. This revealed adenocarcinoma, high grade, signet ring cell subtype, invasive. Tumor marker testing is pending.  The patient has been referred to discuss systemic treatment for the management of her disease. Her husband and two young children were present during the encounter. We had [employer] video interpreter  during this encounter who spoke [place].  She is only able to drink liquids; such as milk or soups. She has emesis when she eat solids. She is supplementing with Boost/Ensure with 1.5 - 2 a day. She reports less emesis with Ensure. The nausea is better is she does not eat anything. She reports her  stool is normal in color. The patient reports she lost 25 lbs in the last 6 months. She continues to have abdominal pain.  The patient reports she is not using anything for contraception at this time. She used Depo-Provera in the past.  CURRENT THERAPY: first line chemo mFOLFOX6, every 2 weeks started 05/10/17  INTERIM HISTORY:  [name] is here for a follow-up and cycle 4 FOLFOX. She tolerated her last cycle of FOLFOX on 06/08/2017 without difficulty. She presents to the clinic today accompanied by an interpreter and husband. She reports tube feeding and by mouth eating is OK. She eats rice soup in the morning and evening with occasional cookies, instant noodles, and a small amount of vegetables by mouth. She occasionally feels stomach discomfort after eating that quickly resolves after lying down. She denies nausea or vomiting. She has had 2 episodes of diarrhea in the last 24 hours with associated cramping; she has not tried any medication or intervention for her diarrhea. She has not had recent antibiotics or started any new meds. She denies blood in stool.  She reports left-sided intermittent "warm" flank pain that began 10 days ago, lasts 20-30 minutes and resolves spontaneously. Worse when lying down and with prolonged sitting. She has not tried medication or any interventions for her pain.  She denies fatigue, fever, chills, chest pain, shortness of breath, cough, cold sensitivity, or numbness and tingling.  MEDICAL HISTORY:  Past Medical History:  Diagnosis Date   Cancer ([place])    STOMACH   No pertinent past medical history     SURGICAL HISTORY: Past Surgical History:  Procedure Laterality Date   GASTROSTOMY N/A 04/20/2017   Procedure: INSERTION OF J TUBE;  Surgeon: Stark Klein, MD;  Location: [facility];  Service: General;  Laterality: N/A;   NO PAST SURGERIES     PORTACATH PLACEMENT Left 04/20/2017   Procedure: INSERTION PORT-A-CATH;  Surgeon: Stark Klein, MD;  Location:  [facility];  Service: General;  Laterality: Left;    SOCIAL HISTORY: Social History   Social History   Marital status: Married    Spouse name: N/A   Number of children: N/A   Years of education: N/A   Occupational History   Not on file.   Social History Main Topics   Smoking status: Never Smoker   Smokeless tobacco: Never Used   Alcohol use No   Drug use: No   Sexual activity: Not Currently   Other Topics Concern   Not on file   Social History Narrative   ** Merged History Encounter **       The patient has 5 siblings. She is the oldest. She is married with 2 children. She has an 55 yo son and a 24 yo daughter. 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 Her husband works for [employer].  FAMILY HISTORY: Family History  Problem Relation Age of Onset   Hypertension Mother    Heart disease Mother    Other Neg Hx     ALLERGIES:  has No Known Allergies.  MEDICATIONS:  Current Outpatient Prescriptions  Medication Sig Dispense Refill   Nutritional  Supplements (FEEDING SUPPLEMENT, OSMOLITE 1.2 CAL,) LIQD Place 1,000 mLs into feeding tube continuous. 40000 mL 11   polyethylene glycol (MIRALAX) packet Take half a pack (8.5 oz) every 12 hours. Adjust as needed for soft stools 30 each 1   zolpidem (AMBIEN) 5 MG tablet Take 1 tablet (5 mg total) by mouth at bedtime as needed for sleep. 5 tablet 0   dicyclomine (BENTYL) 20 MG tablet Take 1 tablet (20 mg total) by mouth 2 (two) times daily. (Patient not taking: Reported on 06/08/2017) 20 tablet 0   HYDROcodone-acetaminophen (HYCET) 7.5-325 mg/15 ml solution Take 10 mLs by mouth every 4 (four) hours as needed for moderate pain or severe pain. (Patient not taking: Reported on 06/08/2017) 120 mL 0   ibuprofen (ADVIL,MOTRIN) 600 MG tablet Take 1 tablet (600 mg total) by mouth every 6 (six) hours. (Patient not taking: Reported on 04/18/2017) 60 tablet 0   lidocaine-prilocaine (EMLA) cream Apply to portacath site 1 hour  prior to use 30 g 2   metoCLOPramide (REGLAN) 5 MG tablet Take 2 tablets (10 mg total) by mouth 4 (four) times daily -  before meals and at bedtime. (Patient not taking: Reported on 06/08/2017) 120 tablet 1   ondansetron (ZOFRAN) 8 MG tablet Take 1 tablet (8 mg total) by mouth every 8 (eight) hours as needed for nausea or vomiting. (Patient not taking: Reported on 04/18/2017) 30 tablet 0   pantoprazole (PROTONIX) 40 MG tablet Take 1 tablet (40 mg total) by mouth daily at 12 noon. (Patient not taking: Reported on 07/06/2017) 30 tablet 3   promethazine (PHENERGAN) 25 MG suppository Place 1 suppository (25 mg total) rectally every 6 (six) hours as needed for nausea. (Patient not taking: Reported on 07/06/2017) 12 each 0   No current facility-administered medications for this visit.     REVIEW OF SYSTEMS:   Constitutional: Denies fevers, chills or abnormal night sweats (+) Purposeful weight gain  Eyes: Denies blurriness of vision, double vision or watery eyes Ears, nose, mouth, throat, and face: Denies mucositis or sore throat Respiratory: Denies cough, dyspnea or wheezes Cardiovascular: Denies palpitation, chest discomfort or lower extremity swelling Gastrointestinal:  Denies nausea, vomiting, heartburn or change in bowel habits. J tube functioning properly without pain or difficulty.  (+) left flank intermittent "warm" sensation/pain with prolonged sitting and lying down Skin: Denies abnormal skin rashes Lymphatics: Denies new lymphadenopathy or easy bruising Neurological:Denies numbness, tingling, cold sensitivity, or new weaknesses Behavioral/Psych: Mood is stable, no new changes  All other systems were reviewed with the patient and are negative.  PHYSICAL EXAMINATION:  ECOG PERFORMANCE STATUS: 1 - Symptomatic but completely ambulatory  Vitals:   07/06/17 1146  BP: 124/90  Pulse: 92  Resp: 18  Temp: 98 F (36.7 C)  SpO2: 100%   Filed Weights   07/06/17 1146  Weight: 103 lb 1.6 oz  (46.8 kg)    GENERAL:alert, no distress and comfortable, cachectic SKIN: skin color, texture, turgor are normal, no rashes or significant lesions EYES: normal, conjunctiva are pink and non-injected, sclera clear OROPHARYNX:no exudate, no erythema and lips, buccal mucosa, and tongue normal  NECK: supple, thyroid normal size, non-tender, without nodularity LYMPH:  no palpable Cervical or supraclavicular lymphadenopathy LUNGS: clear to auscultation bilaterally with normal breathing effort HEART: regular rate & rhythm S1 and S2 present, no murmurs and no lower extremity edema ABDOMEN: left J tube present; site intact without erythema, edema, or drainage. abdomen soft with normal bowel sounds. No palpable hepatomegaly or masses. Musculoskeletal:  no cyanosis of digits and no clubbing. No tenderness to palpation of left posterior lateral/flank region, no rash. PSYCH: alert & oriented x 3 with fluent speech NEURO: no focal motor/sensory deficits  LABORATORY DATA:  I have reviewed the data as listed CBC Latest Ref Rng & Units 07/06/2017 06/08/2017 05/24/2017  WBC 3.9 - 10.3 10e3/uL 9.2 3.7(L) 3.3(L)  Hemoglobin 11.6 - 15.9 g/dL 11.3(L) 10.3(L) 11.1(L)  Hematocrit 34.8 - 46.6 % 35.2 32.1(L) 33.6(L)  Platelets 145 - 400 10e3/uL 203 180 236    CMP Latest Ref Rng & Units 07/06/2017 06/08/2017 05/24/2017  Glucose 70 - 140 mg/dl 115 97 100  BUN 7.0 - 26.0 mg/dL 9.6 11.9 12.3  Creatinine 0.6 - 1.1 mg/dL 0.6 0.6 0.6  Sodium 136 - 145 mEq/L 138 139 136  Potassium 3.5 - 5.1 mEq/L 4.0 4.2 4.5  Chloride 101 - 111 mmol/L - - -  CO2 22 - 29 mEq/L 27 27 25   Calcium 8.4 - 10.4 mg/dL 9.2 8.5 8.9  Total Protein 6.4 - 8.3 g/dL 7.5 6.2(L) 6.7  Total Bilirubin 0.20 - 1.20 mg/dL 0.24 0.28 0.25  Alkaline Phos 40 - 150 U/L 76 56 55  AST 5 - 34 U/L 73(H) 33 22  ALT 0 - 55 U/L 98(H) 47 25    PATHOLOGY: Diagnosis 04/20/2017 Omentum, resection for tumor - METASTATIC CARCINOMA WITH FOCAL SIGNET RING CELL  DIFFERENTIATION. - ONE BENIGN LYMPH NODE (0/1). - SEE COMMENT. Microscopic Comment Given the clinical history as well as the histologic findings, the features are consistent with metastatic gastric carcinoma. Additional studies can be performed upon clinician request. (JBK:ecj 04/21/2017)   FINAL MICROSCOPIC DIAGNOSIS 04/12/17 Stomach, Bx: ADENOCARCINOMA, HIGH GRADE, SIGNET RING CELL SUBTYPE, INVASIVE HER-2 NEU TESTING WILL BE SENT TO [facility] AND THE RESULT WILL BE FOLLOWED AS AN ADDEDUM, CYTO-7, CDX-2, CHYI-50 AND HELICOBACTER IP STAINS PENDING This case was discussed with Dr. Paulita Fujita on 04/14/2017. PER DR. Paulita Fujita, THE LESION IS AN ULCERATED MASS, MALIGNANT APPEARANCE, CARCINOMA VS LYMHOMA, HE CALLED AND DISCUSSED THIS AT 04/13/2017 AND SPOKE WITH DR. Jacky Kindle M.D., Pathologyist (Case signed 04/14/2017)  PROCEDURES: Endoscopy by Dr. Paulita Fujita 04/12/17 The examined esophagus was normal. A large amount of food (residue) was found in the gastric fundus, in the gastric body, and in the gastric antrum. A large, fungating, infiltrative and ulcerated, non-circumferential mass with no bleeding was found in the prepyloric region of the stomach, extending to the pylorus distally and proximally along the lesser cure of the antrum and body. Mass more ulcer in appearance with heaped-up margins. Biopsies were taken with a cold forceps for histology. Tight pinhole pyloric channel, unable to transverse this area with the endoscope.   RADIOGRAPHIC STUDIES: I have personally reviewed the radiological images as listed and agreed with the findings in the report. No results found.  CT CHEST W CONTRAST 04/20/17 IMPRESSION: No evidence of metastatic disease in the chest. No active pulmonary disease. Visualized proximal stomach is distended with fluid and debris.  ASSESSMENT & PLAN: 41 y.o. [place] woman who presented with abdominal pain, nausea, and emesis for 4-5 months and significant  weight loss. Here today for cycle 4 FOLFOX chemotherapy.  1. Gastric adenocarcinoma, prepylori area, high grade, signet ring cell subtype, with peritoneal metastasis, PD-L1 (+) -MD reviewed her CT scan, endoscopy finding and the biopsy results with patient and her husband in great details, through a vedio interpreter.  -The patient had an endoscopy by Dr. Paulita Fujita on 04/12/17 revealing a mass in  the prepyloric region of the stomach. Biopsy of the mass revealed high grade adenocarcinoma of stomach. 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 Biopsy confirmed metastasis. -MD recommend palliative chemotherapy with first-line FOLFOX. Her tumor HER2 was negative.  -MD reviewed her Foundation one genomic testing results, unfortunately she does not have any targetable mutations. PDL 1 test results to pending, her tumor tissue was not sufficient for MSI testing. I'll try to obtain her initial gastric mass biopsy tissue for the MSI testing.  -MD discussed her that surgery is not indicated now due to her metastatic disease. However if she has great response to chemotherapy, I may refer her to Dr. Hartford Poli to see if she is a candidate for debulking and HIPEC -She has started first-line chemotherapy FOLFOX on 05/10/17, tolerating very well. -MD reviewed her Foundation one genomic testing results, unfortunately there is no tonsillar therapy available. -Her tumor was tested positive for PDL 1 expression, she is a candidate for immunotherapy as a second line. MD discussed and reviewed with her in details. -She is on continuous feeding tube, tolerating well, has gained weight, overall feels better. She's also eating soft and liquid diet, no nausea or vomiting. She will try to advance her diet. -Labs reviewed and she has slightly elevated liver enzymes, otherwise her levels are within normal limits.  She will proceed with cycle 4 FOLFOX today and continue every 2 weeks, she is  tolerating this well so far -Will repeat scan after cycle 5 in 07/2017 -F/u office visit 4 weeks    2. Abdominal pain, nausea and vomiting  -Secondary to her gastric cancer and pyloric outlet obstruction -She will continue omeprazole, and use Zofran, Reglan, and Phenergan as needed for nausea  -resolved now   3. Anorexia and weight loss, moderate calorie and protein malnutrition  -The patient has over a 25 lb weight loss in the past 6 months and she currently weighs 87 lbs. This is secondary of her disease -She has a poor appetite with her eating habits impacted by nausea, emesis, and abdominal pain. - patient now has a J-Tube and is on tube feeds, 4 cans a day, followed by home care service  -she has gained some weight lately  -MD previously discussed that when she is able to gain her weight and can eat by mouth, she can remove her feeding tube. It will help to let the chemo shrink her tumor so she is able to eat by mouth more. That I encouraged her to try to eat small meals as her tumor shrinks in response to chemo. She agrees and will try to advance her diet.  4. Genetics  -she does not have family history of malignancy. -Due to her young age, I'll ask our genetic counselor to see if she meets the criteria for genetic testing. -She is agreeable with genetic referral if needed. -MD previously referred to genetics  5. Anemia  -she has mild anemia, possible related to small bleeding from her gastric cancer and anemia in neoplastic disease, resolved now -will monitor her iron study  -Ferritin 32 on 07/06/2017  6. Goal of care discussion  -We again discussed the incurable nature of her cancer, and the overall poor prognosis, especially if she does not have good response to chemotherapy or progress on chemo -The patient understands the goal of care is palliative. -MD recommends DNR/DNI, she will think about it   7. Financial Help -Pt has Medicaid; she is not currently working -They  want to know can they get help applying for food stamps -previously referred to social work  8. Diarrhea -Trial over-the-counter Imodium, she will call the clinic if this does not control her diarrhea.  9. Left side Pain -This could be musculoskeletal or related to her J-tube. We will continue to monitor.  PLAN -Lab reviewed, adequate for treatment, we'll proceed to cycle 4 FOLFOX today, and continue every 2 weeks. -CT chest abdomen pelvis with contrast after cycle 5 FOLFOX. -Try Imodium for diarrhea; will call clinic if this worsens or does not improve.  -F/u office visit in 4 weeks   Orders Placed This Encounter  Procedures   CT Abdomen Pelvis W Contrast    Standing Status:   Future    Standing Expiration Date:   07/06/2018    Order Specific Question:   If indicated for the ordered procedure, I authorize the administration of contrast media per Radiology protocol    Answer:   Yes    Order Specific Question:   Is patient pregnant?    Answer:   No    Order Specific Question:   Preferred imaging location?    Answer:   [facility]    Order Specific Question:   Radiology Contrast Protocol - do NOT remove file path    Answer:   \[email or web address]   CT Chest W Contrast    Standing Status:   Future    Standing Expiration Date:   07/06/2018    Order Specific Question:   If indicated for the ordered procedure, I authorize the administration of contrast media per Radiology protocol    Answer:   Yes    Order Specific Question:   Is patient pregnant?    Answer:   No    Order Specific Question:   Preferred imaging location?    Answer:   [facility]    Order Specific Question:   Radiology Contrast Protocol - do NOT remove file path    Answer:   \[email or web address]    All questions were answered. The patient knows to call the clinic with any problems, questions or concerns. I spent 30 minutes counseling the patient face  to face, by using the medial interpreting service. The total time spent in the appointment was 40 minutes and more than 50% was on counseling.   Cira Rue, AGNP-C 07/06/2017  Addendum: I have seen the patient, examined her. I agree with the assessment and and plan and have edited the notes.      Truitt Merle, MD 07/06/2017   This document serves as a record of services personally performed by Truitt Merle, MD. It was created on her behalf by Joslyn Devon, a trained medical scribe. The creation of this record is based on the scribe's personal observations and the provider's statements to them. This document has been checked and approved by the attending provider.

## 2017-07-06 NOTE — Progress Notes (Signed)
Pt is approved for the $400 [facility] grant.  °

## 2017-07-06 NOTE — Patient Instructions (Signed)
[facility] Discharge Instructions for Patients Receiving Chemotherapy  Today you received the following chemotherapy agents Oxaliplatin, Leucovorin, and 5 FU  To help prevent nausea and vomiting after your treatment, we encourage you to take your nausea medication as directed If you develop nausea and vomiting that is not controlled by your nausea medication, call the clinic.   BELOW ARE SYMPTOMS THAT SHOULD BE REPORTED IMMEDIATELY:  *FEVER GREATER THAN 100.5 F  *CHILLS WITH OR WITHOUT FEVER  NAUSEA AND VOMITING THAT IS NOT CONTROLLED WITH YOUR NAUSEA MEDICATION  *UNUSUAL SHORTNESS OF BREATH  *UNUSUAL BRUISING OR BLEEDING  TENDERNESS IN MOUTH AND THROAT WITH OR WITHOUT PRESENCE OF ULCERS  *URINARY PROBLEMS  *BOWEL PROBLEMS  UNUSUAL RASH Items with * indicate a potential emergency and should be followed up as soon as possible.  Feel free to call the clinic you have any questions or concerns. The clinic phone number is [phone].  Please show the CHEMO ALERT CARD at check-in to the Emergency Department and triage nurse.   

## 2017-07-06 NOTE — Progress Notes (Signed)
Nutrition follow-up completed with patient and interpreter patient is receiving chemotherapy for gastric adenocarcinoma with peritoneal metastases. Patient reports good tolerance of Osmolite 1.2 at 66 mL an hour via jejunostomy tube most days a week. Patient is drinking some liquids and trying to eat soft foods. Noted weight increased and documented as 103.1 pounds August 29, up from 99.1 pounds August 1. Albumin increased 3.4.  Estimated nutrition needs: 1500-1700 calories per day, 60-80 g protein, 1.7 L fluid.  Osmolite 1.2 at 66 mL an hour for 20 hours provides 1584 cal, 73 g protein, and 1082 mL free water.  Nutrition diagnosis: Severe malnutrition is improving.  Intervention: Patient should continue current tube feeding at 66 mL an hour for 20 hours with a 60 cc free water flush before and after continuous feeding. Patient will continue to consume foods as tolerated. Questions were answered.  Teach back method used.  Monitoring, evaluation, goals: Patient will tolerate soft diet and tube feeding to promote weight gain.  Next visit: Wednesday, September 26 during infusion.  **Disclaimer: This note was dictated with voice recognition software. Similar sounding words can inadvertently be transcribed and this note may contain transcription errors which may not have been corrected upon publication of note.**

## 2017-07-06 NOTE — Progress Notes (Signed)
Pt saw Regan Rakers, NP and Dr. Burr Medico today prior to chemo.  OK to treat with all lab results today as per Regan Rakers, NP.

## 2017-07-07 ENCOUNTER — Telehealth: Payer: Self-pay | Admitting: Hematology

## 2017-07-07 NOTE — Telephone Encounter (Signed)
Called patient regarding September appointments advised to ask for a schedule on next office visit 8/31

## 2017-07-08 ENCOUNTER — Ambulatory Visit (HOSPITAL_BASED_OUTPATIENT_CLINIC_OR_DEPARTMENT_OTHER): Payer: Medicaid Other

## 2017-07-08 ENCOUNTER — Encounter (INDEPENDENT_AMBULATORY_CARE_PROVIDER_SITE_OTHER): Payer: Self-pay

## 2017-07-08 VITALS — BP 106/74 | HR 88 | Temp 98.5°F | Resp 16

## 2017-07-08 DIAGNOSIS — Z452 Encounter for adjustment and management of vascular access device: Secondary | ICD-10-CM

## 2017-07-08 DIAGNOSIS — C163 Malignant neoplasm of pyloric antrum: Secondary | ICD-10-CM | POA: Diagnosis not present

## 2017-07-08 MED ORDER — HEPARIN SOD (PORK) LOCK FLUSH 100 UNIT/ML IV SOLN
500.0000 [IU] | Freq: Once | INTRAVENOUS | Status: AC | PRN
  Administered 2017-07-08: 500 [IU]
  Filled 2017-07-08: qty 5

## 2017-07-08 MED ORDER — SODIUM CHLORIDE 0.9% FLUSH
10.0000 mL | INTRAVENOUS | Status: DC | PRN
  Administered 2017-07-08: 10 mL
  Filled 2017-07-08: qty 10

## 2017-07-20 ENCOUNTER — Ambulatory Visit (HOSPITAL_BASED_OUTPATIENT_CLINIC_OR_DEPARTMENT_OTHER): Payer: Medicaid Other

## 2017-07-20 ENCOUNTER — Other Ambulatory Visit (HOSPITAL_BASED_OUTPATIENT_CLINIC_OR_DEPARTMENT_OTHER): Payer: Medicaid Other

## 2017-07-20 ENCOUNTER — Other Ambulatory Visit: Payer: Self-pay | Admitting: Hematology

## 2017-07-20 VITALS — BP 123/82 | HR 91 | Temp 97.7°F | Resp 17 | Wt 103.2 lb

## 2017-07-20 DIAGNOSIS — C163 Malignant neoplasm of pyloric antrum: Secondary | ICD-10-CM

## 2017-07-20 DIAGNOSIS — C169 Malignant neoplasm of stomach, unspecified: Secondary | ICD-10-CM

## 2017-07-20 DIAGNOSIS — Z95828 Presence of other vascular implants and grafts: Secondary | ICD-10-CM

## 2017-07-20 DIAGNOSIS — C162 Malignant neoplasm of body of stomach: Secondary | ICD-10-CM

## 2017-07-20 DIAGNOSIS — Z5111 Encounter for antineoplastic chemotherapy: Secondary | ICD-10-CM

## 2017-07-20 LAB — COMPREHENSIVE METABOLIC PANEL
ALT: 39 U/L (ref 0–55)
AST: 42 U/L — AB (ref 5–34)
Albumin: 3.2 g/dL — ABNORMAL LOW (ref 3.5–5.0)
Alkaline Phosphatase: 77 U/L (ref 40–150)
Anion Gap: 7 mEq/L (ref 3–11)
BUN: 10.6 mg/dL (ref 7.0–26.0)
CALCIUM: 9 mg/dL (ref 8.4–10.4)
CHLORIDE: 104 meq/L (ref 98–109)
CO2: 26 mEq/L (ref 22–29)
CREATININE: 0.6 mg/dL (ref 0.6–1.1)
Glucose: 93 mg/dl (ref 70–140)
POTASSIUM: 4.1 meq/L (ref 3.5–5.1)
SODIUM: 138 meq/L (ref 136–145)
Total Bilirubin: <0.22 mg/dL (ref 0.20–1.20)
Total Protein: 7.3 g/dL (ref 6.4–8.3)

## 2017-07-20 LAB — CBC WITH DIFFERENTIAL/PLATELET
BASO%: 0.2 % (ref 0.0–2.0)
BASOS ABS: 0 10*3/uL (ref 0.0–0.1)
EOS%: 1.6 % (ref 0.0–7.0)
Eosinophils Absolute: 0.1 10*3/uL (ref 0.0–0.5)
HEMATOCRIT: 33.8 % — AB (ref 34.8–46.6)
HGB: 11.3 g/dL — ABNORMAL LOW (ref 11.6–15.9)
LYMPH%: 16.6 % (ref 14.0–49.7)
MCH: 29.7 pg (ref 25.1–34.0)
MCHC: 33.4 g/dL (ref 31.5–36.0)
MCV: 88.9 fL (ref 79.5–101.0)
MONO#: 0.7 10*3/uL (ref 0.1–0.9)
MONO%: 10.9 % (ref 0.0–14.0)
NEUT#: 4.4 10*3/uL (ref 1.5–6.5)
NEUT%: 70.7 % (ref 38.4–76.8)
Platelets: 366 10*3/uL (ref 145–400)
RBC: 3.8 10*6/uL (ref 3.70–5.45)
RDW: 16 % — ABNORMAL HIGH (ref 11.2–14.5)
WBC: 6.2 10*3/uL (ref 3.9–10.3)
lymph#: 1 10*3/uL (ref 0.9–3.3)

## 2017-07-20 MED ORDER — SODIUM CHLORIDE 0.9 % IV SOLN
2400.0000 mg/m2 | INTRAVENOUS | Status: DC
  Administered 2017-07-20: 3250 mg via INTRAVENOUS
  Filled 2017-07-20: qty 65

## 2017-07-20 MED ORDER — DEXTROSE 5 % IV SOLN
Freq: Once | INTRAVENOUS | Status: AC
  Administered 2017-07-20: 11:00:00 via INTRAVENOUS

## 2017-07-20 MED ORDER — LEUCOVORIN CALCIUM INJECTION 350 MG
400.0000 mg/m2 | Freq: Once | INTRAVENOUS | Status: AC
  Administered 2017-07-20: 540 mg via INTRAVENOUS
  Filled 2017-07-20: qty 27

## 2017-07-20 MED ORDER — SODIUM CHLORIDE 0.9% FLUSH
10.0000 mL | INTRAVENOUS | Status: DC | PRN
  Filled 2017-07-20: qty 10

## 2017-07-20 MED ORDER — PALONOSETRON HCL INJECTION 0.25 MG/5ML
0.2500 mg | Freq: Once | INTRAVENOUS | Status: AC
  Administered 2017-07-20: 0.25 mg via INTRAVENOUS

## 2017-07-20 MED ORDER — HYDROCODONE-ACETAMINOPHEN 7.5-325 MG/15ML PO SOLN: 10.0000 mL | ORAL | 0 refills | Status: DC | PRN

## 2017-07-20 MED ORDER — DEXAMETHASONE SODIUM PHOSPHATE 10 MG/ML IJ SOLN
INTRAMUSCULAR | Status: AC
  Filled 2017-07-20: qty 1

## 2017-07-20 MED ORDER — PALONOSETRON HCL INJECTION 0.25 MG/5ML
INTRAVENOUS | Status: AC
  Filled 2017-07-20: qty 5

## 2017-07-20 MED ORDER — SODIUM CHLORIDE 0.9% FLUSH
10.0000 mL | Freq: Once | INTRAVENOUS | Status: AC
  Administered 2017-07-20: 10 mL
  Filled 2017-07-20: qty 10

## 2017-07-20 MED ORDER — OXALIPLATIN CHEMO INJECTION 100 MG/20ML
85.0000 mg/m2 | Freq: Once | INTRAVENOUS | Status: AC
  Administered 2017-07-20: 115 mg via INTRAVENOUS
  Filled 2017-07-20: qty 13

## 2017-07-20 MED ORDER — DEXAMETHASONE SODIUM PHOSPHATE 10 MG/ML IJ SOLN
10.0000 mg | Freq: Once | INTRAMUSCULAR | Status: AC
  Administered 2017-07-20: 10 mg via INTRAVENOUS

## 2017-07-20 MED ORDER — FLUOROURACIL CHEMO INJECTION 2.5 GM/50ML
400.0000 mg/m2 | Freq: Once | INTRAVENOUS | Status: AC
  Administered 2017-07-20: 550 mg via INTRAVENOUS
  Filled 2017-07-20: qty 11

## 2017-07-20 MED ORDER — HEPARIN SOD (PORK) LOCK FLUSH 100 UNIT/ML IV SOLN
500.0000 [IU] | Freq: Once | INTRAVENOUS | Status: DC | PRN
  Filled 2017-07-20: qty 5

## 2017-07-20 NOTE — Patient Instructions (Signed)
[facility] Discharge Instructions for Patients Receiving Chemotherapy  Today you received the following chemotherapy agents: Oxaliplatin, Leucovorin and Adrucil.   To help prevent nausea and vomiting after your treatment, we encourage you to take your nausea medication as directed.  If you develop nausea and vomiting that is not controlled by your nausea medication, call the clinic.   BELOW ARE SYMPTOMS THAT SHOULD BE REPORTED IMMEDIATELY:  *FEVER GREATER THAN 100.5 F  *CHILLS WITH OR WITHOUT FEVER  NAUSEA AND VOMITING THAT IS NOT CONTROLLED WITH YOUR NAUSEA MEDICATION  *UNUSUAL SHORTNESS OF BREATH  *UNUSUAL BRUISING OR BLEEDING  TENDERNESS IN MOUTH AND THROAT WITH OR WITHOUT PRESENCE OF ULCERS  *URINARY PROBLEMS  *BOWEL PROBLEMS  UNUSUAL RASH Items with * indicate a potential emergency and should be followed up as soon as possible.  Feel free to call the clinic you have any questions or concerns. The clinic phone number is [phone].  Please show the CHEMO ALERT CARD at check-in to the Emergency Department and triage nurse.   

## 2017-07-20 NOTE — Patient Instructions (Signed)

## 2017-07-20 NOTE — Progress Notes (Signed)
Dr Burr Medico at chairside to assess pt. Dr. Burr Medico reviewed orders with pt.   Blood return noted before and after Adrucil push.

## 2017-07-22 ENCOUNTER — Ambulatory Visit (HOSPITAL_BASED_OUTPATIENT_CLINIC_OR_DEPARTMENT_OTHER): Payer: Medicaid Other

## 2017-07-22 VITALS — BP 115/70 | HR 76 | Temp 98.1°F | Resp 18

## 2017-07-22 DIAGNOSIS — Z452 Encounter for adjustment and management of vascular access device: Secondary | ICD-10-CM | POA: Diagnosis not present

## 2017-07-22 DIAGNOSIS — C163 Malignant neoplasm of pyloric antrum: Secondary | ICD-10-CM | POA: Diagnosis not present

## 2017-07-22 MED ORDER — SODIUM CHLORIDE 0.9% FLUSH
10.0000 mL | INTRAVENOUS | Status: DC | PRN
  Administered 2017-07-22: 10 mL
  Filled 2017-07-22: qty 10

## 2017-07-22 MED ORDER — HEPARIN SOD (PORK) LOCK FLUSH 100 UNIT/ML IV SOLN
500.0000 [IU] | Freq: Once | INTRAVENOUS | Status: AC | PRN
  Administered 2017-07-22: 500 [IU]
  Filled 2017-07-22: qty 5

## 2017-07-31 ENCOUNTER — Emergency Department (HOSPITAL_COMMUNITY)
Admission: EM | Admit: 2017-07-31 | Discharge: 2017-08-01 | Disposition: A | Discharge status: Home / Self Care | Payer: Medicaid Other | Attending: Emergency Medicine | Admitting: Emergency Medicine

## 2017-07-31 ENCOUNTER — Emergency Department (HOSPITAL_COMMUNITY): Payer: Medicaid Other

## 2017-07-31 ENCOUNTER — Encounter (HOSPITAL_COMMUNITY): Payer: Self-pay | Admitting: *Deleted

## 2017-07-31 DIAGNOSIS — R180 Malignant ascites: Secondary | ICD-10-CM

## 2017-07-31 DIAGNOSIS — R188 Other ascites: Secondary | ICD-10-CM | POA: Insufficient documentation

## 2017-07-31 DIAGNOSIS — C163 Malignant neoplasm of pyloric antrum: Secondary | ICD-10-CM | POA: Diagnosis not present

## 2017-07-31 DIAGNOSIS — Z931 Gastrostomy status: Secondary | ICD-10-CM | POA: Diagnosis not present

## 2017-07-31 DIAGNOSIS — R05 Cough: Secondary | ICD-10-CM | POA: Insufficient documentation

## 2017-07-31 DIAGNOSIS — Z79899 Other long term (current) drug therapy: Secondary | ICD-10-CM | POA: Diagnosis not present

## 2017-07-31 DIAGNOSIS — C482 Malignant neoplasm of peritoneum, unspecified: Secondary | ICD-10-CM | POA: Diagnosis not present

## 2017-07-31 DIAGNOSIS — Z95828 Presence of other vascular implants and grafts: Secondary | ICD-10-CM | POA: Diagnosis not present

## 2017-07-31 DIAGNOSIS — R059 Cough, unspecified: Secondary | ICD-10-CM

## 2017-07-31 DIAGNOSIS — C169 Malignant neoplasm of stomach, unspecified: Secondary | ICD-10-CM

## 2017-07-31 DIAGNOSIS — R509 Fever, unspecified: Secondary | ICD-10-CM | POA: Diagnosis present

## 2017-07-31 LAB — COMPREHENSIVE METABOLIC PANEL
ALK PHOS: 89 U/L (ref 38–126)
ALT: 43 U/L (ref 14–54)
ANION GAP: 10 (ref 5–15)
AST: 47 U/L — ABNORMAL HIGH (ref 15–41)
Albumin: 2.9 g/dL — ABNORMAL LOW (ref 3.5–5.0)
BUN: 9 mg/dL (ref 6–20)
CALCIUM: 8.5 mg/dL — AB (ref 8.9–10.3)
CO2: 25 mmol/L (ref 22–32)
CREATININE: 0.39 mg/dL — AB (ref 0.44–1.00)
Chloride: 99 mmol/L — ABNORMAL LOW (ref 101–111)
Glucose, Bld: 101 mg/dL — ABNORMAL HIGH (ref 65–99)
Potassium: 4 mmol/L (ref 3.5–5.1)
SODIUM: 134 mmol/L — AB (ref 135–145)
TOTAL PROTEIN: 6.7 g/dL (ref 6.5–8.1)
Total Bilirubin: 0.7 mg/dL (ref 0.3–1.2)

## 2017-07-31 LAB — CBC
HCT: 33.7 % — ABNORMAL LOW (ref 36.0–46.0)
HEMOGLOBIN: 11.2 g/dL — AB (ref 12.0–15.0)
MCH: 28.9 pg (ref 26.0–34.0)
MCHC: 33.2 g/dL (ref 30.0–36.0)
MCV: 86.9 fL (ref 78.0–100.0)
PLATELETS: 295 10*3/uL (ref 150–400)
RBC: 3.88 MIL/uL (ref 3.87–5.11)
RDW: 14.9 % (ref 11.5–15.5)
WBC: 9.2 10*3/uL (ref 4.0–10.5)

## 2017-07-31 LAB — LIPASE, BLOOD: Lipase: 22 U/L (ref 11–51)

## 2017-07-31 LAB — I-STAT CG4 LACTIC ACID, ED: LACTIC ACID, VENOUS: 0.84 mmol/L (ref 0.5–1.9)

## 2017-07-31 MED ORDER — SODIUM CHLORIDE 0.9 % IV BOLUS (SEPSIS)
1000.0000 mL | Freq: Once | INTRAVENOUS | Status: AC
  Administered 2017-07-31: 1000 mL via INTRAVENOUS

## 2017-07-31 MED ORDER — ONDANSETRON HCL 4 MG/2ML IJ SOLN
4.0000 mg | Freq: Once | INTRAMUSCULAR | Status: AC
  Administered 2017-07-31: 4 mg via INTRAVENOUS
  Filled 2017-07-31: qty 2

## 2017-07-31 MED ORDER — SODIUM CHLORIDE 0.9 % IV SOLN
INTRAVENOUS | Status: DC
  Administered 2017-07-31: 23:00:00 via INTRAVENOUS

## 2017-07-31 MED ORDER — IOPAMIDOL (ISOVUE-300) INJECTION 61%
INTRAVENOUS | Status: AC
  Filled 2017-07-31: qty 30

## 2017-07-31 MED ORDER — IOPAMIDOL (ISOVUE-300) INJECTION 61%
INTRAVENOUS | Status: AC
  Administered 2017-08-01: 100 mL via INTRAVENOUS
  Filled 2017-07-31: qty 100

## 2017-07-31 MED ORDER — IOPAMIDOL (ISOVUE-300) INJECTION 61%
INTRAVENOUS | Status: AC
  Filled 2017-07-31: qty 75

## 2017-07-31 NOTE — ED Provider Notes (Signed)
[facility] DEPT Provider Note   CSN: [id number] Arrival date & time: 07/31/17  1843     History   Chief Complaint Chief Complaint  Patient presents with  . Fever  . Abdominal Pain  . Emesis  . Nausea    HPI Claire Bray is a 41 y.o. female.  41 year old female with history of gastric cancer presents with fever at home up to 100.5. Last chemotherapy treatment was 11 days ago. She has had mild cough but no congestion. Had diffuse abdominal pain for several weeks that she has been self-medicating with Percocet. No emesis today. No diarrhea. Denies any urinary symptoms. Was told the past that if she has a temperature above 100.4 to come to the ER.      Past Medical History:  Diagnosis Date  . Cancer (HCC)    STOMACH  . No pertinent past medical history     Patient Active Problem List   Diagnosis Date Noted  . 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 Goals of care, counseling/discussion 05/11/2017  . 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 Port catheter in place 04/27/2017  . Protein-calorie malnutrition, severe 04/20/2017  . Gastric cancer ([place]) 04/18/2017  . Helicobacter pylori gastritis 01/12/2017    Past Surgical History:  Procedure Laterality Date  . GASTROSTOMY N/A 04/20/2017   Procedure: INSERTION OF J TUBE;  Surgeon: Stark Klein, MD;  Location: [facility];  Service: General;  Laterality: N/A;  . NO PAST SURGERIES    . PORTACATH PLACEMENT Left 04/20/2017   Procedure: INSERTION PORT-A-CATH;  Surgeon: Stark Klein, MD;  Location: [place];  Service: General;  Laterality: Left;    OB History    Gravida Para Term Preterm AB Living   2 2 2  0 0 1   SAB TAB Ectopic Multiple Live Births   0 0 0   1       Home Medications    Prior to Admission medications   Medication Sig Start Date End Date Taking? Authorizing Provider  dicyclomine (BENTYL) 20 MG tablet Take 1 tablet (20 mg total) by mouth 2 (two) times daily. Patient not taking: Reported on  06/08/2017 04/02/17   Montine Circle, PA-C  HYDROcodone-acetaminophen (HYCET) 7.5-325 mg/15 ml solution Take 10 mLs by mouth every 4 (four) hours as needed for moderate pain or severe pain. 07/20/17   Truitt Merle, MD  ibuprofen (ADVIL,MOTRIN) 600 MG tablet Take 1 tablet (600 mg total) by mouth every 6 (six) hours. Patient not taking: Reported on 04/18/2017 12/14/12   Nolon Rod, DO  lidocaine-prilocaine (EMLA) cream Apply to portacath site 1 hour prior to use 04/27/17   Owens Shark, NP  metoCLOPramide (REGLAN) 5 MG tablet Take 2 tablets (10 mg total) by mouth 4 (four) times daily -  before meals and at bedtime. Patient not taking: Reported on 06/08/2017 04/06/17   Marcille Buffy D, CNM  Nutritional Supplements (FEEDING SUPPLEMENT, OSMOLITE 1.2 CAL,) LIQD Place 1,000 mLs into feeding tube continuous. 04/24/17   Stark Klein, MD  ondansetron (ZOFRAN) 8 MG tablet Take 1 tablet (8 mg total) by mouth every 8 (eight) hours as needed for nausea or vomiting. Patient not taking: Reported on 04/18/2017 04/17/17   Varney Biles, MD  pantoprazole (PROTONIX) 40 MG tablet Take 1 tablet (40 mg total) by mouth daily at 12 noon. Patient not taking: Reported on 07/06/2017 04/24/17   Stark Klein, MD  polyethylene glycol [facility]) packet Take half a pack (8.5 oz) every 12 hours. Adjust as needed  for soft stools 04/29/17   Mu, Pilar Plate, MD  promethazine (PHENERGAN) 25 MG suppository Place 1 suppository (25 mg total) rectally every 6 (six) hours as needed for nausea. Patient not taking: Reported on 07/06/2017 04/17/17   Varney Biles, MD  zolpidem (AMBIEN) 5 MG tablet Take 1 tablet (5 mg total) by mouth at bedtime as needed for sleep. 12/11/12   Martha Clan, MD    Family History Family History  Problem Relation Age of Onset  . Hypertension Mother   . Heart disease Mother   . Other Neg Hx     Social History Social History  Substance Use Topics  . Smoking status: Never Smoker  . Smokeless tobacco: Never Used  .  Alcohol use No     Allergies   Patient has no known allergies.   Review of Systems Review of Systems  All other systems reviewed and are negative.    Physical Exam Updated Vital Signs BP (!) 132/97 (BP Location: Right Arm)   Pulse (!) 103   Temp 98.3 F (36.8 C) (Oral)   Resp 18   Ht 1.651 m (5\' 5" )   Wt 46.7 kg (103 lb)   SpO2 100%   BMI 17.14 kg/m   Physical Exam  Constitutional: She is oriented to person, place, and time. She appears well-developed and well-nourished.  Non-toxic appearance. No distress.  HENT:  Head: Normocephalic and atraumatic.  Eyes: Pupils are equal, round, and reactive to light. Conjunctivae, EOM and lids are normal.  Neck: Normal range of motion. Neck supple. No tracheal deviation present. No thyroid mass present.  Cardiovascular: Normal rate, regular rhythm and normal heart sounds.  Exam reveals no gallop.   No murmur heard. Pulmonary/Chest: Effort normal and breath sounds normal. No stridor. No respiratory distress. She has no decreased breath sounds. She has no wheezes. She has no rhonchi. She has no rales.  Abdominal: Soft. Normal appearance and bowel sounds are normal. She exhibits distension. There is generalized tenderness. There is no rebound and no CVA tenderness.  Musculoskeletal: Normal range of motion. She exhibits no edema or tenderness.  Neurological: She is alert and oriented to person, place, and time. She has normal strength. No cranial nerve deficit or sensory deficit. GCS eye subscore is 4. GCS verbal subscore is 5. GCS motor subscore is 6.  Skin: Skin is warm and dry. No abrasion and no rash noted.  Psychiatric: She has a normal mood and affect. Her speech is normal and behavior is normal.  Nursing note and vitals reviewed.    ED Treatments / Results  Labs (all labs ordered are listed, but only abnormal results are displayed) Labs Reviewed  CULTURE, BLOOD (ROUTINE X 2)  CULTURE, BLOOD (ROUTINE X 2)  LIPASE, BLOOD    COMPREHENSIVE METABOLIC PANEL  CBC  URINALYSIS, ROUTINE W REFLEX MICROSCOPIC  I-STAT CG4 LACTIC ACID, ED    EKG  EKG Interpretation None       Radiology No results found.  Procedures Procedures (including critical care time)  Medications Ordered in ED Medications  sodium chloride 0.9 % bolus 1,000 mL (not administered)  0.9 %  sodium chloride infusion (not administered)     Initial Impression / Assessment and Plan / ED Course  I have reviewed the triage vital signs and the nursing notes.  Pertinent labs & imaging results that were available during my care of the patient were reviewed by me and considered in my medical decision making (see chart for details).  Patient has normal white count here. Chest x-ray without acute findings. Lactate is normal. Abdominal and chest CT pending at this time. Discussed with Dr. Alen Blew who recommends no antibiotics and they will follow her up in the cancer clinic  Final Clinical Impressions(s) / ED Diagnoses   Final diagnoses:  Cough    New Prescriptions New Prescriptions   No medications on file     Lacretia Leigh, MD 07/31/17 [id number]

## 2017-07-31 NOTE — ED Notes (Signed)
Portable Xray in room.

## 2017-07-31 NOTE — ED Notes (Signed)
Per pt's husband, pt is unable to provide urine specimen.

## 2017-07-31 NOTE — ED Triage Notes (Signed)
Pt developed fever, nausea , vomiting last night, continues today. Pt has history stomach cancer. Pt receiving chemo last treatment 07/20/17

## 2017-08-01 ENCOUNTER — Other Ambulatory Visit: Payer: Self-pay | Admitting: Hematology

## 2017-08-01 ENCOUNTER — Telehealth: Payer: Self-pay | Admitting: Hematology

## 2017-08-01 ENCOUNTER — Encounter: Payer: Self-pay | Admitting: Hematology

## 2017-08-01 ENCOUNTER — Encounter (HOSPITAL_COMMUNITY): Payer: Self-pay

## 2017-08-01 ENCOUNTER — Ambulatory Visit (HOSPITAL_COMMUNITY): Admission: RE | Admit: 2017-08-01 | Payer: Medicaid Other | Source: Ambulatory Visit | Admitting: Hematology

## 2017-08-01 ENCOUNTER — Ambulatory Visit (HOSPITAL_COMMUNITY)
Admission: RE | Admit: 2017-08-01 | Discharge: 2017-08-01 | Disposition: A | Discharge status: Home / Self Care | Payer: Medicaid Other | Source: Ambulatory Visit | Attending: Hematology | Admitting: Hematology

## 2017-08-01 ENCOUNTER — Ambulatory Visit (HOSPITAL_BASED_OUTPATIENT_CLINIC_OR_DEPARTMENT_OTHER): Payer: Medicaid Other | Admitting: Hematology

## 2017-08-01 ENCOUNTER — Emergency Department (HOSPITAL_COMMUNITY): Payer: Medicaid Other

## 2017-08-01 VITALS — BP 144/93 | HR 117 | Temp 97.7°F | Resp 16 | Ht 65.0 in | Wt 111.5 lb

## 2017-08-01 DIAGNOSIS — N839 Noninflammatory disorder of ovary, fallopian tube and broad ligament, unspecified: Secondary | ICD-10-CM

## 2017-08-01 DIAGNOSIS — R112 Nausea with vomiting, unspecified: Secondary | ICD-10-CM

## 2017-08-01 DIAGNOSIS — C163 Malignant neoplasm of pyloric antrum: Secondary | ICD-10-CM

## 2017-08-01 DIAGNOSIS — R188 Other ascites: Secondary | ICD-10-CM

## 2017-08-01 DIAGNOSIS — E440 Moderate protein-calorie malnutrition: Secondary | ICD-10-CM

## 2017-08-01 DIAGNOSIS — R109 Unspecified abdominal pain: Secondary | ICD-10-CM

## 2017-08-01 DIAGNOSIS — C786 Secondary malignant neoplasm of retroperitoneum and peritoneum: Secondary | ICD-10-CM | POA: Diagnosis not present

## 2017-08-01 LAB — BODY FLUID CELL COUNT WITH DIFFERENTIAL
LYMPHS FL: 33 %
MONOCYTE-MACROPHAGE-SEROUS FLUID: 49 % — AB (ref 50–90)
NEUTROPHIL FLUID: 18 % (ref 0–25)
Total Nucleated Cell Count, Fluid: 1269 cu mm — ABNORMAL HIGH (ref 0–1000)

## 2017-08-01 LAB — ALBUMIN, PLEURAL OR PERITONEAL FLUID: Albumin, Fluid: 2.1 g/dL

## 2017-08-01 MED ORDER — HYDROCODONE-ACETAMINOPHEN 5-325 MG PO TABS: 1.0000 | ORAL_TABLET | Freq: Four times a day (QID) | ORAL | 0 refills | Status: DC | PRN

## 2017-08-01 MED ORDER — IOPAMIDOL (ISOVUE-300) INJECTION 61%
100.0000 mL | Freq: Once | INTRAVENOUS | Status: AC | PRN
  Administered 2017-08-01: 100 mL via INTRAVENOUS

## 2017-08-01 NOTE — ED Notes (Signed)
Pt transported to CT ?

## 2017-08-01 NOTE — ED Provider Notes (Signed)
Care assumed from Dr. Zenia Resides at shift change. Patient with history of gastric cancer presenting for evaluation of abdominal discomfort. Care was signed out to me awaiting results of CT scans. These returned showing new, large volume ascites.This finding was discussed with Dr. Alen Blew from oncology who does not feel as though this mandates admission. He is recommending follow-up in the clinic in the next 1-2 days.  The patient reported fever at home, however is afebrile here. She has no white count and is nontoxic appearing. I have considered, but doubt SBP. I have sent an Epic message to Dr. Burr Medico, the patient's oncologist to notify her of the above findings.   Veryl Speak, MD 08/01/17 [phone]

## 2017-08-01 NOTE — Progress Notes (Signed)
[place]  Telephone:([phone]) [phone] [phone]) [phone]  Clinic Follow up Note   Patient Care Team: Patient, No Pcp Per as PCP - General (General Practice) Patient, No Pcp Per (General Practice) Arta Silence, MD as Consulting Physician (Gastroenterology) Truitt Merle, MD as Consulting Physician (Hematology) Stark Klein, MD as Consulting Physician (General Surgery) 08/01/2017  CHIEF COMPLAINTS:  Adenocarcinoma of the stomach  Oncology History   Cancer Staging Gastric cancer [facility]) Staging form: Stomach, AJCC 8th Edition - Clinical stage from 04/12/2017: Stage IVB (cTX, cN0, cM1) - Signed by Truitt Merle, MD on 05/26/2017       Gastric cancer ([place])   04/12/2017 Procedure    Endoscopy by Dr. Paulita Fujita 04/12/17 A large, fungating, infiltrative and ulcerated, non-circumferential mass with no bleeding was found in the prepyloric region of the stomach, extending to the pylorus distally and proximally along the lesser cure of the antrum and body. Mass more ulcer in appearance with heaped-up margins. Tight pinhole pyloric channel, unable to transverse this area with the endoscope.      04/12/2017 Initial Biopsy    FINAL MICROSCOPIC DIAGNOSIS 04/12/17 Stomach, Bx: ADENOCARCINOMA, HIGH GRADE, SIGNET RING CELL SUBTYPE, INVASIVE      04/18/2017 Initial Diagnosis    Gastric cancer ([place])     04/18/2017 Miscellaneous    HER2 not amplified (IHC borderline, FISH her2/CEN17 ratio 1.4, copy number 3.15) MMR normal       04/20/2017 Pathology Results    Diagnosis Omentum, resection for tumor - METASTATIC CARCINOMA WITH FOCAL SIGNET RING CELL DIFFERENTIATION. - ONE BENIGN LYMPH NODE (0/1). - SEE COMMENT. Microscopic Comment Given the clinical history as well as the histologic findings, the features are consistent with metastatic gastric carcinoma. Additional studies can be performed upon clinician request. (JBK:ecj 04/21/2017)      04/20/2017 Miscellaneous    Foundation One genomic  tests showed postive mutation in ARID1A, MSI and mutation burden was not able to be evaluated.        04/20/2017 Miscellaneous    PD-L1 expression positve, with combined positive score 25 (>=1 is positve)       05/10/2017 -  Chemotherapy    first line chemo [id number], every 2 weeks         08/01/2017 Imaging    CT CAP 08/01/17  IMPRESSION: 1. Large volume of ascites, new since prior exam. No omental caking or pleural nodularity identified. 2. Right adnexal cystic masslike abnormality with peripheral mural thickening along its right lateral order measuring 6.7 x 5.8 x 6.9cm. This may be of ovarian etiology and further characterization with ultrasound may prove useful. 3. Moderate-to-marked mural thickening of the distal body and antrum of the stomach consistent with history of gastric cancer. 4. Jejunostomy tube tip is noted within small bowel loops in the right hemiabdomen. 5. Retained contrast in the upper esophagus. Reflux is not excluded versus delayed esophageal emptying. 6. Trace bilateral pleural effusions and atelectasis.        HISTORY OF PRESENTING ILLNESS (04/18/2017):  Claire Bray 41 y.o. female is here because of a new diagnosis of adenocarcinoma of the stomach.  The patient presented to the ED on 04/02/17 complaining of right lower abdominal pain that has been intermittent since January 2018. The pain worsened with an onset of emesis and diarrhea. The patient also had weight loss of approximately 25 lbs. Workup CT ABD/PEL showed significant gastroparesis, diffuse bowel dysmotility, and the small bowel was largely distended with fluid and air.  The patient presented to  Deliah Goody, PA of [facility] on 04/11/17. By then,. On abdominal exam, a mass was palpated in the right middle and bilateral lower abdomen - questionable stool in the colon/instestines. The patient underwent an endoscopy by Dr. Paulita Fujita on 04/12/17. This revealed a large, fungating, infiltrative mass in  the prepyloric region of the stomach extending to the pylorus distally and proximally along the lesser curve of the antrum and body. Biopsies were obtained. This revealed adenocarcinoma, high grade, signet ring cell subtype, invasive. Tumor marker testing is pending.  The patient has been referred today to discuss systemic treatment for the management of her disease. Her husband and two young children were present during the encounter. We had [employer] video interpreter during this encounter who spoke [place].  She is only able to drink liquids; such as milk or soups. She has emesis when she eat solids. She is supplementing with Boost/Ensure with 1.5 - 2 a day. She reports less emesis with Ensure. The nausea is better is she does not eat anything. She reports her stool is normal in color. The patient reports she lost 25 lbs in the last 6 months. She continues to have abdominal pain.  The patient reports she is not using anything for contraception at this time. She used Depo-Provera in the past.  CURRENT THERAPY: first line chemo [id number], every 2 weeks   INTERIM HISTORY:  Claire Bray is here for add on follow-up. Pt was seen in the ED yesterday due to  abdominal discomfort, abdominal distension, and fever. She has also had some nausea associated with this as well. She has been trying to tolerate soups at home, but she states that she has been having nausea post-prandially intermittently. During her stay in the ED she had a CT A/p and Chest performed which confirmed ascites, possibly malignant, as well as a new adnexal masslike abnormality. SBP was less likely in favor during ddx. She was afebrile while in the ED as well. Oncology was consulted at that time and admission was not felt to be warranted. She is scheduled for a paracentesis follow this appointment. She also notes that she has been experiencing more loose stools lately, but she denies overt diarrhea. She has no other voiced symptoms or  complaints at this time.   MEDICAL HISTORY:  Past Medical History:  Diagnosis Date  . Cancer (HCC)    STOMACH  . No pertinent past medical history     SURGICAL HISTORY: Past Surgical History:  Procedure Laterality Date  . GASTROSTOMY N/A 04/20/2017   Procedure: INSERTION OF J TUBE;  Surgeon: Stark Klein, MD;  Location: [place];  Service: General;  Laterality: N/A;  . NO PAST SURGERIES    . PORTACATH PLACEMENT Left 04/20/2017   Procedure: INSERTION PORT-A-CATH;  Surgeon: Stark Klein, MD;  Location: [place];  Service: General;  Laterality: Left;    SOCIAL HISTORY: Social History   Social History  . Marital status: Married    Spouse name: N/A  . Number of children: N/A  . Years of education: N/A   Occupational History  . Not on file.   Social History Main Topics  . Smoking status: Never Smoker  . Smokeless tobacco: Never Used  . Alcohol use No  . Drug use: No  . Sexual activity: Not Currently   Other Topics Concern  . Not on file   Social History Narrative   ** Merged History Encounter **       The patient has 5 siblings.  She is the oldest. She is married with 2 children. She has an 63 yo son and a 31 yo daughter. 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 Her husband works for [employer].  FAMILY HISTORY: Family History  Problem Relation Age of Onset  . Hypertension Mother   . Heart disease Mother   . Other Neg Hx     ALLERGIES:  has No Known Allergies.  MEDICATIONS:  Current Outpatient Prescriptions  Medication Sig Dispense Refill  . dicyclomine (BENTYL) 20 MG tablet Take 1 tablet (20 mg total) by mouth 2 (two) times daily. 20 tablet 0  . HYDROcodone-acetaminophen (NORCO) 5-325 MG tablet Take 1-2 tablets by mouth every 6 (six) hours as needed. 20 tablet 0  . ibuprofen (ADVIL,MOTRIN) 600 MG tablet Take 1 tablet (600 mg total) by mouth every 6 (six) hours. 60 tablet 0  . lidocaine-prilocaine (EMLA) cream Apply to portacath site 1 hour prior to use 30 g  2  . metoCLOPramide (REGLAN) 5 MG tablet Take 2 tablets (10 mg total) by mouth 4 (four) times daily -  before meals and at bedtime. 120 tablet 1  . Nutritional Supplements (FEEDING SUPPLEMENT, OSMOLITE 1.2 CAL,) LIQD Place 1,000 mLs into feeding tube continuous. 40000 mL 11  . ondansetron (ZOFRAN) 8 MG tablet Take 1 tablet (8 mg total) by mouth every 8 (eight) hours as needed for nausea or vomiting. 30 tablet 0  . pantoprazole (PROTONIX) 40 MG tablet Take 1 tablet (40 mg total) by mouth daily at 12 noon. 30 tablet 3  . polyethylene glycol (MIRALAX) packet Take half a pack (8.5 oz) every 12 hours. Adjust as needed for soft stools 30 each 1  . promethazine (PHENERGAN) 25 MG suppository Place 1 suppository (25 mg total) rectally every 6 (six) hours as needed for nausea. 12 each 0  . zolpidem (AMBIEN) 5 MG tablet Take 1 tablet (5 mg total) by mouth at bedtime as needed for sleep. 5 tablet 0   No current facility-administered medications for this visit.     REVIEW OF SYSTEMS:   Constitutional: Denies fevers, chills or abnormal night sweats (+) Weight gain  Eyes: Denies blurriness of vision, double vision or watery eyes Ears, nose, mouth, throat, and face: Denies mucositis or sore throat Respiratory: Denies cough, dyspnea or wheezes Cardiovascular: Denies palpitation, chest discomfort or lower extremity swelling Gastrointestinal:  Denies heartburn. (+)nausea, abdominal discomfort, abdominal distension.  Skin: Denies abnormal skin rashes Lymphatics: Denies new lymphadenopathy or easy bruising Neurological:Denies numbness, tingling or new weaknesses Behavioral/Psych: Mood is stable, no new changes  All other systems were reviewed with the patient and are negative.  PHYSICAL EXAMINATION:  ECOG PERFORMANCE STATUS: 1 - Symptomatic but completely ambulatory  Vitals:   08/01/17 1346  BP: (!) 144/93  Pulse: (!) 117  Resp: 16  Temp: 97.7 F (36.5 C)  SpO2: 100%   Filed Weights   08/01/17 1346    Weight: 111 lb 8 oz (50.6 kg)    GENERAL:alert, no distress and comfortable, cachectic SKIN: skin color, texture, turgor are normal, no rashes or significant lesions EYES: normal, conjunctiva are pink and non-injected, sclera clear OROPHARYNX:no exudate, no erythema and lips, buccal mucosa, and tongue normal  NECK: supple, thyroid normal size, non-tender, without nodularity LYMPH:  no palpable lymphadenopathy in the cervical, axillary or inguinal LUNGS: clear to auscultation and percussion with normal breathing effort HEART: regular rate & rhythm and no murmurs and no lower extremity edema ABDOMEN:  Moderate  ascites is noted on exam with positive percussion.  Musculoskeletal:no cyanosis of digits and no clubbing  PSYCH: alert & oriented x 3 with fluent speech NEURO: no focal motor/sensory deficits  LABORATORY DATA:  I have reviewed the data as listed CBC Latest Ref Rng & Units 07/31/2017 07/20/2017 07/06/2017  WBC 4.0 - 10.5 K/uL 9.2 6.2 9.2  Hemoglobin 12.0 - 15.0 g/dL 11.2(L) 11.3(L) 11.3(L)  Hematocrit 36.0 - 46.0 % 33.7(L) 33.8(L) 35.2  Platelets 150 - 400 K/uL 295 366 203    CMP Latest Ref Rng & Units 07/31/2017 07/20/2017 07/06/2017  Glucose 65 - 99 mg/dL 101(H) 93 115  BUN 6 - 20 mg/dL 9 10.6 9.6  Creatinine 0.44 - 1.00 mg/dL 0.39(L) 0.6 0.6  Sodium 135 - 145 mmol/L 134(L) 138 138  Potassium 3.5 - 5.1 mmol/L 4.0 4.1 4.0  Chloride 101 - 111 mmol/L 99(L) - -  CO2 22 - 32 mmol/L _0 Calcium 8.9 - 10.3 mg/dL 8.5(L) 9.0 9.2  Total Protein 6.5 - 8.1 g/dL 6.7 7.3 7.5  Total Bilirubin 0.3 - 1.2 mg/dL 0.7 <0.22 0.24  Alkaline Phos 38 - 126 U/L 89 77 76  AST 15 - 41 U/L 47(H) 42(H) 73(H)  ALT 14 - 54 U/L 43 39 98(H)    PATHOLOGY: Diagnosis 04/20/2017 Omentum, resection for tumor - METASTATIC CARCINOMA WITH FOCAL SIGNET RING CELL DIFFERENTIATION. - ONE BENIGN LYMPH NODE (0/1). - SEE COMMENT. Microscopic Comment Given the clinical history as well as the histologic  findings, the features are consistent with metastatic gastric carcinoma. Additional studies can be performed upon clinician request. (JBK:ecj 04/21/2017)   FINAL MICROSCOPIC DIAGNOSIS 04/12/17 Stomach, Bx: ADENOCARCINOMA, HIGH GRADE, SIGNET RING CELL SUBTYPE, INVASIVE HER-2 NEU TESTING WILL BE SENT TO [facility] AND THE RESULT WILL BE FOLLOWED AS AN ADDEDUM, CYTO-7, CDX-2, FBPZ-02 AND HELICOBACTER IP STAINS PENDING This case was discussed with Dr. Paulita Fujita on 04/14/2017. PER DR. Paulita Fujita, THE LESION IS AN ULCERATED MASS, MALIGNANT APPEARANCE, CARCINOMA VS LYMHOMA, HE CALLED AND DISCUSSED THIS AT 04/13/2017 AND SPOKE WITH DR. Jacky Kindle M.D., Pathologyist (Case signed 04/14/2017)  PROCEDURES: Endoscopy by Dr. Paulita Fujita 04/12/17 The examined esophagus was normal. A large amount of food (residue) was found in the gastric fundus, in the gastric body, and in the gastric antrum. A large, fungating, infiltrative and ulcerated, non-circumferential mass with no bleeding was found in the prepyloric region of the stomach, extending to the pylorus distally and proximally along the lesser cure of the antrum and body. Mass more ulcer in appearance with heaped-up margins. Biopsies were taken with a cold forceps for histology. Tight pinhole pyloric channel, unable to transverse this area with the endoscope.   RADIOGRAPHIC STUDIES: I have personally reviewed the radiological images as listed and agreed with the findings in the report. Dg Chest 1 View  Result Date: 07/31/2017 CLINICAL DATA:  Shortness of breath with lower central chest pain and abdominal pain 3 days. EXAM: CHEST 1 VIEW COMPARISON:  04/20/2017 FINDINGS: Left subclavian Port-A-Cath with tip over the SVC. Lungs are adequately inflated without consolidation or effusion. Cardiomediastinal silhouette and remainder of the exam is within normal. IMPRESSION: No acute cardiopulmonary disease. Electronically Signed   By: Marin Olp M.D.   On:  07/31/2017 21:55   Ct Chest W Contrast  Result Date: 08/01/2017 CLINICAL DATA:  Fever, nausea and vomiting x2 days. Patient is on chemotherapy for gastric cancer and has a gastrostomy placed 04/20/2017. EXAM: CT CHEST, ABDOMEN, AND PELVIS WITH CONTRAST  TECHNIQUE: Multidetector CT imaging of the chest, abdomen and pelvis was performed following the standard protocol during bolus administration of intravenous contrast. CONTRAST:  80 cc Isovue-300 IV COMPARISON:  Chest CT 04/19/2017, CT abdomen and pelvis reportedly from 04/02/2017 is not available on PACs within this system. FINDINGS: CT CHEST FINDINGS Cardiovascular: Normal heart size. No significant pericardial effusion or thickening. Great vessels are normal in course and caliber. No large central pulmonary embolus. Mediastinum/Nodes: Reflux of contrast within the esophagus to the distal third versus delayed emptying. No mediastinal or hilar lymphadenopathy. Tiny subcentimeter left thyroid lobe nodule is unchanged. Lungs/Pleura: Trace bibasilar effusions and atelectasis. No pulmonary consolidations. No dominant mass. Musculoskeletal: No aggressive focal osseous lesions. CT ABDOMEN PELVIS FINDINGS Hepatobiliary: No biliary dilatation or space-occupying mass of the liver. Contracted body and fundus of the gallbladder. No radiopaque calculi. Pancreas: Normal without ductal dilatation or mass. Spleen: Normal size.  No mass. Adrenals/Urinary Tract: Punctate interpolar left renal calculus. No enhancing renal masses. Normal bilateral adrenal glands. The urinary bladder is physiologically distended. Stomach/Bowel: Circumferential thickening of the gastric body and antrum consistent with known gastric neoplasm. Contrast passes antegrade into large bowel. A percutaneous jejunostomy tube is noted with tip within small bowel in the right hemiabdomen. Vascular/Lymphatic: No significant vascular findings are present. No enlarged abdominal or pelvic lymph nodes. Reproductive:  Normal appearing uterus. There is a somewhat circumscribed right adnexal cystic mass with eccentric thickening along the right lateral periphery measuring 6.7 x 5.8 x 6.9 cm. An ovarian mass is not excluded. The left ovary is normal. Other: Large volume of ascites, new since prior exam. No omental thickening or definite nodularity of the peritoneum. Musculoskeletal: No aggressive appearing lesions. IMPRESSION: 1. Large volume of ascites, new since prior exam. No omental caking or pleural nodularity identified. 2. Right adnexal cystic masslike abnormality with peripheral mural thickening along its right lateral order measuring 6.7 x 5.8 x 6.9 cm. This may be of ovarian etiology and further characterization with ultrasound may prove useful. 3. Moderate-to-marked mural thickening of the distal body and antrum of the stomach consistent with history of gastric cancer. 4. Jejunostomy tube tip is noted within small bowel loops in the right hemiabdomen. 5. Retained contrast in the upper esophagus. Reflux is not excluded versus delayed esophageal emptying. 6. Trace bilateral pleural effusions and atelectasis. Electronically Signed   By: Ashley Royalty M.D.   On: 08/01/2017 01:44   Ct Abdomen Pelvis W Contrast  Result Date: 08/01/2017 CLINICAL DATA:  Fever, nausea and vomiting x2 days. Patient is on chemotherapy for gastric cancer and has a gastrostomy placed 04/20/2017. EXAM: CT CHEST, ABDOMEN, AND PELVIS WITH CONTRAST TECHNIQUE: Multidetector CT imaging of the chest, abdomen and pelvis was performed following the standard protocol during bolus administration of intravenous contrast. CONTRAST:  80 cc Isovue-300 IV COMPARISON:  Chest CT 04/19/2017, CT abdomen and pelvis reportedly from 04/02/2017 is not available on PACs within this system. FINDINGS: CT CHEST FINDINGS Cardiovascular: Normal heart size. No significant pericardial effusion or thickening. Great vessels are normal in course and caliber. No large central  pulmonary embolus. Mediastinum/Nodes: Reflux of contrast within the esophagus to the distal third versus delayed emptying. No mediastinal or hilar lymphadenopathy. Tiny subcentimeter left thyroid lobe nodule is unchanged. Lungs/Pleura: Trace bibasilar effusions and atelectasis. No pulmonary consolidations. No dominant mass. Musculoskeletal: No aggressive focal osseous lesions. CT ABDOMEN PELVIS FINDINGS Hepatobiliary: No biliary dilatation or space-occupying mass of the liver. Contracted body and fundus of the gallbladder. No radiopaque calculi. Pancreas: Normal  without ductal dilatation or mass. Spleen: Normal size.  No mass. Adrenals/Urinary Tract: Punctate interpolar left renal calculus. No enhancing renal masses. Normal bilateral adrenal glands. The urinary bladder is physiologically distended. Stomach/Bowel: Circumferential thickening of the gastric body and antrum consistent with known gastric neoplasm. Contrast passes antegrade into large bowel. A percutaneous jejunostomy tube is noted with tip within small bowel in the right hemiabdomen. Vascular/Lymphatic: No significant vascular findings are present. No enlarged abdominal or pelvic lymph nodes. Reproductive: Normal appearing uterus. There is a somewhat circumscribed right adnexal cystic mass with eccentric thickening along the right lateral periphery measuring 6.7 x 5.8 x 6.9 cm. An ovarian mass is not excluded. The left ovary is normal. Other: Large volume of ascites, new since prior exam. No omental thickening or definite nodularity of the peritoneum. Musculoskeletal: No aggressive appearing lesions. IMPRESSION: 1. Large volume of ascites, new since prior exam. No omental caking or pleural nodularity identified. 2. Right adnexal cystic masslike abnormality with peripheral mural thickening along its right lateral order measuring 6.7 x 5.8 x 6.9 cm. This may be of ovarian etiology and further characterization with ultrasound may prove useful. 3.  Moderate-to-marked mural thickening of the distal body and antrum of the stomach consistent with history of gastric cancer. 4. Jejunostomy tube tip is noted within small bowel loops in the right hemiabdomen. 5. Retained contrast in the upper esophagus. Reflux is not excluded versus delayed esophageal emptying. 6. Trace bilateral pleural effusions and atelectasis. Electronically Signed   By: Ashley Royalty M.D.   On: 08/01/2017 01:44   US Paracentesis  Result Date: 08/01/2017 INDICATION: Gastric cancer, abdominal discomfort, mild temperature elevation, ascites. Request made for diagnostic and therapeutic paracentesis. EXAM: ULTRASOUND GUIDED DIAGNOSTIC AND THERAPEUTIC PARACENTESIS MEDICATIONS: None. COMPLICATIONS: None immediate. PROCEDURE: Informed written consent was obtained from the patient after a discussion of the risks, benefits and alternatives to treatment. A timeout was performed prior to the initiation of the procedure. Initial ultrasound scanning demonstrates a small to moderate amount of ascites within the right upper to mid abdominal quadrant. The right upper to mid abdomen was prepped and draped in the usual sterile fashion. 2% lidocaine was used for local anesthesia. Following this, a Yueh catheter was introduced. An ultrasound image was saved for documentation purposes. The paracentesis was performed. The catheter was removed and a dressing was applied. The patient tolerated the procedure well without immediate post procedural complication. FINDINGS: A total of approximately 2.8 liters of slightly hazy, yellow fluid was removed. Samples were sent to the laboratory as requested by the clinical team. IMPRESSION: Successful ultrasound-guided diagnostic and therapeutic paracentesis yielding 2.8 liters of peritoneal fluid. Read by: Rowe Robert, PA-C Electronically Signed   By: Markus Daft M.D.   On: 08/01/2017 15:32    CT CHEST W CONTRAST 04/20/17 IMPRESSION: No evidence of metastatic disease in the  chest. No active pulmonary disease. Visualized proximal stomach is distended with fluid and debris.  CT CAP 08/01/17  IMPRESSION: 1. Large volume of ascites, new since prior exam. No omental caking or pleural nodularity identified. 2. Right adnexal cystic masslike abnormality with peripheral mural thickening along its right lateral order measuring 6.7 x 5.8 x 6.9cm. This may be of ovarian etiology and further characterization with ultrasound may prove useful. 3. Moderate-to-marked mural thickening of the distal body and antrum of the stomach consistent with history of gastric cancer. 4. Jejunostomy tube tip is noted within small bowel loops in the right hemiabdomen. 5. Retained contrast in the upper esophagus. Reflux  is not excluded versus delayed esophageal emptying. 6. Trace bilateral pleural effusions and atelectasis.  ASSESSMENT & PLAN: 41 y.o. [place] woman who presented with abdominal pain, nausea, and emesis for 4-5 months and significant weight loss   1. Gastric adenocarcinoma, prepylori area, high grade, signet ring cell subtype, with peritoneal metastasis, PD-L1 (+) -I reviewed her CT scan, endoscopy finding and the biopsy results with patient and her husband in great details, through a vedio interpreter.  -The patient had an endoscopy by Dr. Paulita Fujita on 04/12/17 revealing a mass in the prepyloric region of the stomach. Biopsy of the mass revealed high grade adenocarcinoma of stomach. 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 Biopsy confirmed metastasis. --I recommend palliative chemotherapy with first-line FOLFOX. Her tumor HER2 was negative.  -I reviewed her Foundation one genomic testing results, unfortunately she does not have any targetable mutations. PDL 1 test results to pending, her tumor tissue was not sufficient for MSI testing. I'll try to obtain her initial gastric mass biopsy tissue for the MSI testing.  -We discussed her  that surgery is not indicated now due to her metastatic disease. However if she has great response to chemotherapy, I may refer her to Dr. Hartford Poli to see if she is a candidate for debulking and HIPEC -She has started first-line chemotherapy FOLFOX on 05/10/17, tolerating very well. -I reviewed her Foundation one genomic testing results, unfortunately there is no tonsillar therapy available. -Her tumor was tested positive for PDL 1 expression, she is a candidate for immunotherapy as a second line. I discussed and reviewed with her in details. -She is on continuous feeding tube, tolerating well, has gained weight, overall feels better. She's also eating soft and liquid diet, no nausea or vomiting. -She was seen by ED yesterday due to fever and abdominal distention. CT scan of the abdomen reviewed large volume ascites, no significant peritoneal carcinomatosis on the skin. She also developed a large right ovary and cystic lesion. -We will perform US Paracentesis with cytology and culture. I will arrange a pelvic and vaginal [place] to evaluate her ovarian lesion.  -We will also cancel her treatment this week in light of these new symptoms and findings form her most recent CT A/p. I will likely change her cancer treatment if cytology positive for malignant cells   2. Abdominal pain, nausea and vomiting  -Secondary to her gastric cancer and pyloric outlet obstruction -She will continue omeprazole, and use Zofran, Reglan, and Phenergan as needed for nausea  -Slightly worse lately, she has norco as needed for pain   3. Anorexia and weight loss, moderate calorie and protein malnutrition  -The patient has over a 25 lb weight loss in the past 6 months and she currently weighs 87 lbs. This is secondary of her disease -She has a poor appetite with her eating habits impacted by nausea, emesis, and abdominal pain. - patient now has a J-Tube and is on tube feeds, 4 cans a day, followed by home care service  -she has gained  some weight lately  -I discussed that when she is able to gain her weight and can eat by mouth, she can remove her feeding tube. It will help to let the chemo shrink her tumor so she is able to eat by mouth more.  -I suggest she keep a soft and liquid diet of well cook food to eat by mouth  4. Genetics  -she does not have family history of malignancy. -Due to her young  age, I'll ask our genetic counselor to see if she meets the criteria for genetic testing. -She is agreeable with genetic referral if needed.  5. Anemia  -she has mild anemia, possible related to small bleeding from her gastric cancer and anemia in neoplastic disease, resolved now -will monitor her iron study   6. Goal of care discussion  -We again discussed the incurable nature of her cancer, and the overall poor prognosis, especially if she does not have good response to chemotherapy or progress on chemo -The patient understands the goal of care is palliative. -I recommend DNR/DNI, she will think about it   7. Ascites -Confirmed on CT A/p in the ED today, she has appointment with IR later today to have this drained.  -We will evaluated with cytology and cultures to r/o SBP.   8. New right ovarian mass  -Pelvic and vaginal [place] within one week to evaluate this further.     PLAN -Pelvic and vaginal [place] within one week -move all of her appointments from 9/26 to 10/1 or 10/2 -Hold future chemotherapy infusions while we evaluate new findings -Abdominal paracentesis will be performed by IR today; we will send the collections for cytology and culture to evaluate for SBP.   Orders Placed This Encounter  Procedures  . US Pelvis Complete    Standing Status:   Future    Standing Expiration Date:   08/01/2018    Scheduling Instructions:     With vaginal [place]    Order Specific Question:   Reason for Exam (SYMPTOM  OR DIAGNOSIS REQUIRED)    Answer:   evaluate right ovarian lesion seen on CT    Order Specific Question:    Preferred imaging location?    Answer:   [facility]   All questions were answered. The patient knows to call the clinic with any problems, questions or concerns.  I spent 30 minutes counseling the patient face to face, by using the medial interpreting service. The total time spent in the appointment was 40 minutes and more than 50% was on counseling.      Truitt Merle, MD 08/01/2017 7:18 PM  This document serves as a record of services personally performed by Truitt Merle, MD. It was created on her behalf by Reola Mosher, a trained medical scribe. The creation of this record is based on the scribe's personal observations and the provider's statements to them. This document has been checked and approved by the attending provider.

## 2017-08-01 NOTE — ED Notes (Signed)
Pt reminded of the need for urine specimen.

## 2017-08-01 NOTE — Discharge Instructions (Signed)
Follow-up with Dr. Burr Medico in the next 1-2 days.  Return to the emergency department if you develop severe abdominal pain, high fevers, or other new and concerning symptoms.

## 2017-08-01 NOTE — Telephone Encounter (Signed)
Gave avs and calendar for October  °

## 2017-08-01 NOTE — Procedures (Signed)
Ultrasound-guided diagnostic and therapeutic paracentesis performed yielding 2.8 liters of slightly hazy, yellow fluid. No immediate complications. A portion of the fluid was submitted to the laboratory for preordered studies.

## 2017-08-03 ENCOUNTER — Other Ambulatory Visit: Payer: Self-pay

## 2017-08-03 ENCOUNTER — Other Ambulatory Visit: Payer: Medicaid Other

## 2017-08-03 ENCOUNTER — Encounter: Payer: Medicaid Other | Admitting: Nutrition

## 2017-08-03 ENCOUNTER — Ambulatory Visit: Payer: Self-pay | Admitting: Hematology

## 2017-08-03 ENCOUNTER — Ambulatory Visit: Payer: Medicaid Other

## 2017-08-05 ENCOUNTER — Other Ambulatory Visit: Payer: Self-pay | Admitting: *Deleted

## 2017-08-05 ENCOUNTER — Telehealth: Payer: Self-pay | Admitting: *Deleted

## 2017-08-05 DIAGNOSIS — C163 Malignant neoplasm of pyloric antrum: Secondary | ICD-10-CM

## 2017-08-05 DIAGNOSIS — C786 Secondary malignant neoplasm of retroperitoneum and peritoneum: Secondary | ICD-10-CM

## 2017-08-05 DIAGNOSIS — C801 Malignant (primary) neoplasm, unspecified: Principal | ICD-10-CM

## 2017-08-05 LAB — BODY FLUID CULTURE: Culture: NO GROWTH

## 2017-08-05 MED ORDER — HYDROCODONE-ACETAMINOPHEN 7.5-325 MG/15ML PO SOLN: 10.0000 mL | Freq: Four times a day (QID) | ORAL | 0 refills | Status: DC | PRN

## 2017-08-05 NOTE — Telephone Encounter (Signed)
Husband called requesting a call back from nurse.  Spoke with pt, and was informed that pt was given Hydrocodone by ER for abdominal pain below naval area.  Pt stated Hydrocodone - APAP 5-325 tablet not helping with pain at all despite pt has been taking every 6 hours as needed. Pt requested Liquid Hydrocet refill - given by Dr. Burr Medico previously.  Stated Liquid Hydrocodone worked very well for pt - had better pain relief than the tablet. Pt's   Phone      [phone].

## 2017-08-06 ENCOUNTER — Encounter (HOSPITAL_COMMUNITY): Payer: Self-pay | Admitting: *Deleted

## 2017-08-06 ENCOUNTER — Emergency Department (HOSPITAL_COMMUNITY)
Admission: EM | Admit: 2017-08-06 | Discharge: 2017-08-07 | Disposition: A | Discharge status: Home / Self Care | Payer: Medicaid Other | Attending: Emergency Medicine | Admitting: Emergency Medicine

## 2017-08-06 ENCOUNTER — Emergency Department (HOSPITAL_COMMUNITY): Payer: Medicaid Other

## 2017-08-06 DIAGNOSIS — R188 Other ascites: Secondary | ICD-10-CM | POA: Diagnosis not present

## 2017-08-06 DIAGNOSIS — C169 Malignant neoplasm of stomach, unspecified: Secondary | ICD-10-CM | POA: Insufficient documentation

## 2017-08-06 DIAGNOSIS — Z95828 Presence of other vascular implants and grafts: Secondary | ICD-10-CM | POA: Diagnosis not present

## 2017-08-06 DIAGNOSIS — Z79899 Other long term (current) drug therapy: Secondary | ICD-10-CM | POA: Diagnosis not present

## 2017-08-06 DIAGNOSIS — Z931 Gastrostomy status: Secondary | ICD-10-CM | POA: Diagnosis not present

## 2017-08-06 DIAGNOSIS — R1110 Vomiting, unspecified: Secondary | ICD-10-CM

## 2017-08-06 DIAGNOSIS — R112 Nausea with vomiting, unspecified: Secondary | ICD-10-CM | POA: Diagnosis present

## 2017-08-06 DIAGNOSIS — R1084 Generalized abdominal pain: Secondary | ICD-10-CM

## 2017-08-06 DIAGNOSIS — R180 Malignant ascites: Secondary | ICD-10-CM

## 2017-08-06 LAB — COMPREHENSIVE METABOLIC PANEL
ALBUMIN: 2.6 g/dL — AB (ref 3.5–5.0)
ALT: 33 U/L (ref 14–54)
AST: 48 U/L — AB (ref 15–41)
Alkaline Phosphatase: 78 U/L (ref 38–126)
Anion gap: 10 (ref 5–15)
BUN: 13 mg/dL (ref 6–20)
CHLORIDE: 100 mmol/L — AB (ref 101–111)
CO2: 28 mmol/L (ref 22–32)
Calcium: 8.8 mg/dL — ABNORMAL LOW (ref 8.9–10.3)
Creatinine, Ser: 0.57 mg/dL (ref 0.44–1.00)
GFR calc Af Amer: >60 mL/min (ref >60)
GLUCOSE: 131 mg/dL — AB (ref 65–99)
POTASSIUM: 4.6 mmol/L (ref 3.5–5.1)
SODIUM: 138 mmol/L (ref 135–145)
Total Bilirubin: 0.2 mg/dL — ABNORMAL LOW (ref 0.3–1.2)
Total Protein: 7.1 g/dL (ref 6.5–8.1)

## 2017-08-06 LAB — CBC WITH DIFFERENTIAL/PLATELET
BASOS ABS: 0 10*3/uL (ref 0.0–0.1)
BASOS PCT: 0 %
EOS ABS: 0 10*3/uL (ref 0.0–0.7)
EOS PCT: 0 %
HCT: 38.7 % (ref 36.0–46.0)
Hemoglobin: 12.6 g/dL (ref 12.0–15.0)
Lymphocytes Relative: 12 %
Lymphs Abs: 0.9 10*3/uL (ref 0.7–4.0)
MCH: 28.3 pg (ref 26.0–34.0)
MCHC: 32.6 g/dL (ref 30.0–36.0)
MCV: 86.8 fL (ref 78.0–100.0)
MONO ABS: 1.6 10*3/uL — AB (ref 0.1–1.0)
Monocytes Relative: 22 %
Neutro Abs: 4.8 10*3/uL (ref 1.7–7.7)
Neutrophils Relative %: 66 %
PLATELETS: 485 10*3/uL — AB (ref 150–400)
RBC: 4.46 MIL/uL (ref 3.87–5.11)
RDW: 15.6 % — AB (ref 11.5–15.5)
WBC: 7.3 10*3/uL (ref 4.0–10.5)

## 2017-08-06 LAB — CULTURE, BLOOD (ROUTINE X 2)
Culture: NO GROWTH
Culture: NO GROWTH
SPECIAL REQUESTS: ADEQUATE
Special Requests: ADEQUATE

## 2017-08-06 LAB — MAGNESIUM: MAGNESIUM: 2.2 mg/dL (ref 1.7–2.4)

## 2017-08-06 LAB — LIPASE, BLOOD: Lipase: 24 U/L (ref 11–51)

## 2017-08-06 MED ORDER — SODIUM CHLORIDE 0.9 % IV BOLUS (SEPSIS)
1000.0000 mL | Freq: Once | INTRAVENOUS | Status: AC
  Administered 2017-08-06: 1000 mL via INTRAVENOUS

## 2017-08-06 MED ORDER — LIDOCAINE-EPINEPHRINE 1 %-1:100000 IJ SOLN
20.0000 mL | Freq: Once | INTRAMUSCULAR | Status: AC
  Administered 2017-08-06: 20 mL via INTRADERMAL
  Filled 2017-08-06: qty 20

## 2017-08-06 MED ORDER — ONDANSETRON HCL 8 MG PO TABS: 8.0000 mg | ORAL_TABLET | Freq: Three times a day (TID) | ORAL | 0 refills | Status: DC | PRN

## 2017-08-06 MED ORDER — ONDANSETRON HCL 4 MG/2ML IJ SOLN
4.0000 mg | Freq: Once | INTRAMUSCULAR | Status: AC
  Administered 2017-08-06: 4 mg via INTRAVENOUS
  Filled 2017-08-06: qty 2

## 2017-08-06 MED ORDER — MORPHINE SULFATE (PF) 4 MG/ML IV SOLN
4.0000 mg | Freq: Once | INTRAVENOUS | Status: AC
  Administered 2017-08-06: 4 mg via INTRAVENOUS
  Filled 2017-08-06: qty 1

## 2017-08-06 MED ORDER — IOPAMIDOL (ISOVUE-300) INJECTION 61%
INTRAVENOUS | Status: AC
  Administered 2017-08-06: 80 mL via INTRAVENOUS
  Filled 2017-08-06: qty 100

## 2017-08-06 MED ORDER — ONDANSETRON 4 MG PO TBDP
4.0000 mg | ORAL_TABLET | Freq: Once | ORAL | Status: AC
  Administered 2017-08-06: 4 mg via ORAL
  Filled 2017-08-06: qty 1

## 2017-08-06 NOTE — Discharge Instructions (Signed)
Try the zofran at home, return for worsening symptoms.

## 2017-08-06 NOTE — ED Notes (Signed)
Bed: WA12 Expected date:  Expected time:  Means of arrival:  Comments: Triage 3 

## 2017-08-06 NOTE — ED Triage Notes (Signed)
Pt has vomiting and nausea, has not slept for 3 days.

## 2017-08-06 NOTE — ED Provider Notes (Signed)
[facility] DEPT Provider Note   CSN: [id number] Arrival date & time: 08/06/17  1452     History   Chief Complaint Chief Complaint  Patient presents with  . Nausea  . Emesis    HPI Claire Bray is a 41 y.o. female.  41 yo F with gastric cancer, here with persistent vomiting.  Going on for three days.  Vomiting constantly.  Taking narcotics for chronic pain, denies worsening.  Denies fevers, chills.  Denies consipation.  Recently seen here and diagnosed with new onset ascites.  Had drained with some improvement of symptoms.  Thinks it re accumulated a little bit.  Feels very weak and cannot sleep because she keeps waking up to vomit.    The history is provided by the patient and a relative.  Emesis   This is a new problem. The current episode started 2 days ago. The problem occurs more than 10 times per day. The problem has not changed since onset.There has been no fever. Associated symptoms include abdominal pain. Pertinent negatives include no arthralgias, no chills, no diarrhea, no fever, no headaches and no myalgias.  Illness  This is a new problem. The current episode started more than 2 days ago. The problem occurs constantly. The problem has been gradually worsening. Associated symptoms include abdominal pain. Pertinent negatives include no chest pain, no headaches and no shortness of breath. Nothing aggravates the symptoms. Nothing relieves the symptoms. She has tried nothing for the symptoms. The treatment provided no relief.    Past Medical History:  Diagnosis Date  . Cancer (HCC)    STOMACH  . No pertinent past medical history     Patient Active Problem List   Diagnosis Date Noted  . 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 Goals of care, counseling/discussion 05/11/2017  . 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 Port catheter in place 04/27/2017  . Protein-calorie malnutrition, severe 04/20/2017  . Gastric cancer ([place]) 04/18/2017  . Helicobacter  pylori gastritis 01/12/2017    Past Surgical History:  Procedure Laterality Date  . GASTROSTOMY N/A 04/20/2017   Procedure: INSERTION OF J TUBE;  Surgeon: Stark Klein, MD;  Location: [facility];  Service: General;  Laterality: N/A;  . NO PAST SURGERIES    . PORTACATH PLACEMENT Left 04/20/2017   Procedure: INSERTION PORT-A-CATH;  Surgeon: Stark Klein, MD;  Location: [facility];  Service: General;  Laterality: Left;    OB History    Gravida Para Term Preterm AB Living   2 2 2  0 0 1   SAB TAB Ectopic Multiple Live Births   0 0 0   1       Home Medications    Prior to Admission medications   Medication Sig Start Date End Date Taking? Authorizing Provider  HYDROcodone-acetaminophen (NORCO/VICODIN) 5-325 MG tablet Take 1-2 tablets by mouth every 6 (six) hours as needed for pain. 08/01/17  Yes [provider]  lidocaine-prilocaine (EMLA) cream Apply to portacath site 1 hour prior to use 04/27/17  Yes Owens Shark, NP  promethazine (PHENERGAN) 25 MG suppository Place 1 suppository (25 mg total) rectally every 6 (six) hours as needed for nausea. 04/17/17  Yes Varney Biles, MD  dicyclomine (BENTYL) 20 MG tablet Take 1 tablet (20 mg total) by mouth 2 (two) times daily. Patient not taking: Reported on 08/06/2017 04/02/17   Montine Circle, PA-C  HYDROcodone-acetaminophen (HYCET) 7.5-325 mg/15 ml solution Take 10 mLs by mouth every 6 (six) hours as needed for moderate pain. Patient  not taking: Reported on 08/06/2017 08/05/17   Truitt Merle, MD  ibuprofen (ADVIL,MOTRIN) 600 MG tablet Take 1 tablet (600 mg total) by mouth every 6 (six) hours. Patient not taking: Reported on 08/06/2017 12/14/12   Nolon Rod, DO  metoCLOPramide (REGLAN) 5 MG tablet Take 2 tablets (10 mg total) by mouth 4 (four) times daily -  before meals and at bedtime. Patient not taking: Reported on 08/06/2017 04/06/17   Marcille Buffy D, CNM  Nutritional Supplements (FEEDING SUPPLEMENT, OSMOLITE 1.2 CAL,) LIQD Place 1,000 mLs into  feeding tube continuous. Patient not taking: Reported on 08/06/2017 04/24/17   Stark Klein, MD  ondansetron (ZOFRAN) 8 MG tablet Take 1 tablet (8 mg total) by mouth every 8 (eight) hours as needed for nausea or vomiting. 08/06/17   Deno Etienne, DO  pantoprazole (PROTONIX) 40 MG tablet Take 1 tablet (40 mg total) by mouth daily at 12 noon. Patient not taking: Reported on 08/06/2017 04/24/17   Stark Klein, MD  polyethylene glycol [facility]) packet Take half a pack (8.5 oz) every 12 hours. Adjust as needed for soft stools Patient not taking: Reported on 08/06/2017 04/29/17   Payton Emerald, MD  zolpidem (AMBIEN) 5 MG tablet Take 1 tablet (5 mg total) by mouth at bedtime as needed for sleep. Patient not taking: Reported on 08/06/2017 12/11/12   Martha Clan, MD    Family History Family History  Problem Relation Age of Onset  . Hypertension Mother   . Heart disease Mother   . Other Neg Hx     Social History Social History  Substance Use Topics  . Smoking status: Never Smoker  . Smokeless tobacco: Never Used  . Alcohol use No     Allergies   Patient has no known allergies.   Review of Systems Review of Systems  Constitutional: Negative for chills and fever.  HENT: Negative for congestion and rhinorrhea.   Eyes: Negative for redness and visual disturbance.  Respiratory: Negative for shortness of breath and wheezing.   Cardiovascular: Negative for chest pain and palpitations.  Gastrointestinal: Positive for abdominal pain, nausea and vomiting. Negative for diarrhea.  Genitourinary: Negative for dysuria and urgency.  Musculoskeletal: Negative for arthralgias and myalgias.  Skin: Negative for pallor and wound.  Neurological: Negative for dizziness and headaches.     Physical Exam Updated Vital Signs BP (!) 128/104 (BP Location: Right Arm)   Pulse (!) 112   Temp 97.7 F (36.5 C) (Oral)   Resp 16   Ht 5' 4.96" (1.65 m)   Wt 49.9 kg (110 lb)   LMP 07/23/2017 Comment: on chemo  SpO2  99%   BMI 18.33 kg/m   Physical Exam  Constitutional: She is oriented to person, place, and time. She appears well-developed and well-nourished. No distress.  HENT:  Head: Normocephalic and atraumatic.  Eyes: Pupils are equal, round, and reactive to light. EOM are normal.  Neck: Normal range of motion. Neck supple.  Cardiovascular: Normal rate and regular rhythm.  Exam reveals no gallop and no friction rub.   No murmur heard. Pulmonary/Chest: Effort normal. She has no wheezes. She has no rales.  Abdominal: Soft. She exhibits distension. She exhibits no mass. There is tenderness. There is no guarding.  Diffuse abdominal pain, mild. Tympanitic to percussion distention  Musculoskeletal: She exhibits no edema or tenderness.  Neurological: She is alert and oriented to person, place, and time.  Skin: Skin is warm and dry. She is not diaphoretic.  Psychiatric: She has a normal mood  and affect. Her behavior is normal.  Nursing note and vitals reviewed.    ED Treatments / Results  Labs (all labs ordered are listed, but only abnormal results are displayed) Labs Reviewed  CBC WITH DIFFERENTIAL/PLATELET - Abnormal; Notable for the following:       Result Value   RDW 15.6 (*)    Platelets 485 (*)    Monocytes Absolute 1.6 (*)    All other components within normal limits  COMPREHENSIVE METABOLIC PANEL - Abnormal; Notable for the following:    Chloride 100 (*)    Glucose, Bld 131 (*)    Calcium 8.8 (*)    Albumin 2.6 (*)    AST 48 (*)    Total Bilirubin 0.2 (*)    All other components within normal limits  BODY FLUID CULTURE  GRAM STAIN  LIPASE, BLOOD  MAGNESIUM  LACTATE DEHYDROGENASE, PLEURAL OR PERITONEAL FLUID  GLUCOSE, PLEURAL OR PERITONEAL FLUID  PROTEIN, PLEURAL OR PERITONEAL FLUID  ALBUMIN, PLEURAL OR PERITONEAL FLUID  BODY FLUID CELL COUNT WITH DIFFERENTIAL    EKG  EKG Interpretation  Date/Time:  Saturday August 06 2017 17:52:18 EDT Ventricular Rate:  110 PR  Interval:    QRS Duration: 70 QT Interval:  313 QTC Calculation: 424 R Axis:   70 Text Interpretation:  Sinus tachycardia isolated flipped t wave in v3 No old tracing to compare Confirmed by Deno Etienne [phone]) on 08/06/2017 5:57:03 PM       Radiology Ct Abdomen Pelvis W Contrast  Result Date: 08/06/2017 CLINICAL DATA:  Nausea and vomiting. Abdominal distention. Undergoing chemotherapy for gastric cancer. EXAM: CT ABDOMEN AND PELVIS WITH CONTRAST TECHNIQUE: Multidetector CT imaging of the abdomen and pelvis was performed using the standard protocol following bolus administration of intravenous contrast. CONTRAST:  80 cc Isovue-300 COMPARISON:  08/01/2017 FINDINGS: Lower chest: Clear lung bases. Hepatobiliary: Progressive flattening of the lateral margins of the liver by the adjacent ascites. Contracted gallbladder. Pancreas: Flattened pancreatic margins by the ascites. Spleen: Flattened margins by the ascites. Adrenals/Urinary Tract: 2 small left renal calculi. Interval mild to moderate dilatation of the right renal collecting system and proximal ureter with delayed excretion of contrast compared to the left. No obstructing calculus seen. Minimal urine in the bladder. The bladder is compressed by the large amount of ascites. Normal appearing adrenal glands. Stomach/Bowel: Diffuse low-density gastric wall thickening without significant change. This remains most pronounced involving the antrum. Multiple dilated loops of proximal jejunum with mild progression. A percutaneous jejunal catheter remains in place. Normal caliber colon and distal small bowel. Vascular/Lymphatic: Mildly prominent retroperitoneal lymph nodes without significant change. No arterial calcifications are aneurysm. Reproductive: Heterogeneous mass-like enlargement of the cervix, measuring 7.1 x 4.3 cm on image number 83 of series 2. The uterus is also mildly heterogeneous and located in the pelvis on the left. There is more ill-defined,  heterogeneous, soft tissue density in the posterior aspect of the pelvic ascites with one area measuring 6.5 x 3.7 cm on image number 73 of series 2 and another measuring 2.9 x 1.2 cm on image number 74 of series 2. The latter area may represent the right ovary. The left ovary is grossly unremarkable. Other: Large amount of abdominal and pelvic ascites, mildly increased. There is associated centralization of bowel loops. There is also omental heterogeneous soft tissue density currently. Anteriorly on the right, this measures 9.1 x 3.2 cm on image number 44 of series 2 on the left, this measures 3.1 x 1.6 cm on image number  42 of series 2. Musculoskeletal: Mild lumbar spine degenerative changes. IMPRESSION: 1. Interval mild to moderate right hydronephrosis and proximal right hydroureter. This is concerning for ureteral obstruction by mass effect produced by the patient's large amount of ascites. 2. Mildly progressive malignant ascites in the abdomen pelvis with mass effect, as described above. 3. 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 4. Possible cervical mass or metastatic disease obscuring the cervix. 5. Stable low-density gastric wall thickening compatible with the patient's known gastric malignancy. 6. Two small, nonobstructing left renal calculi. 7. Borderline enlarged retroperitoneal lymph nodes. Electronically Signed   By: Claudie Revering M.D.   On: 08/06/2017 19:56    Procedures .Paracentesis Date/Time: 08/06/2017 10:37 PM Performed by: Tyrone Nine Josaiah Muhammed Authorized by: Deno Etienne   Consent:    Consent obtained:  Verbal   Consent given by:  Patient   Risks discussed:  Bleeding, infection and bowel perforation   Alternatives discussed:  No treatment Pre-procedure details:    Procedure purpose:  Therapeutic   Preparation: Patient was prepped and draped in usual sterile fashion   Anesthesia (see MAR for exact dosages):    Anesthesia method:  Local infiltration   Local anesthetic:   Lidocaine 1% WITH epi Procedure details:    Needle gauge:  20   Ultrasound guidance: yes     Puncture site:  R lower quadrant   Fluid removed amount:  2900cc   Fluid appearance:  Amber and clear Post-procedure details:    Patient tolerance of procedure:  Tolerated well, no immediate complications   EMERGENCY DEPARTMENT [place] ACITES EXAM "Study: Limited Abdominal Ultrasound for Evaluation of Free Fluid"  INDICATIONS: Abd pain and Distention  PERFORMED BY: Myself IMAGES ARCHIVED?: Yes VIEWS USES: Right lower quad INTERPRETATION: Free fluid present      (including critical care time)  Medications Ordered in ED Medications  ondansetron (ZOFRAN-ODT) disintegrating tablet 4 mg (4 mg Oral Given 08/06/17 1829)  sodium chloride 0.9 % bolus 1,000 mL (0 mLs Intravenous Stopped 08/06/17 2042)  sodium chloride 0.9 % bolus 1,000 mL (0 mLs Intravenous Stopped 08/06/17 2148)  iopamidol (ISOVUE-300) 61 % injection (80 mLs Intravenous Contrast Given 08/06/17 1857)  lidocaine-EPINEPHrine (XYLOCAINE W/EPI) 1 %-1:100000 (with pres) injection 20 mL (20 mLs Intradermal Given 08/06/17 2043)  morphine 4 MG/ML injection 4 mg (4 mg Intravenous Given 08/06/17 2352)  ondansetron (ZOFRAN) injection 4 mg (4 mg Intravenous Given 08/06/17 2352)     Initial Impression / Assessment and Plan / ED Course  I have reviewed the triage vital signs and the nursing notes.  Pertinent labs & imaging results that were available during my care of the patient were reviewed by me and considered in my medical decision making (see chart for details).     41 yo F with gastric cancer and ascities.  Here with persistent n/v for three days without resolution.  Patient with HR in 130's.  Labs without significant dehydration, well appearing and non toxic.  Concern for obstruction with continued vomiting. Will repeat CT. CT with ascities and likely abd compartment syndrome.    Paracentesis done with significant improvement of symptoms.   Cessation of vomiting.   Prolonged time for lab to result.  Family becoming restless requesting d/c.  Doubt SBP based on symptoms but explained to family that I am unable to rule out with fluid studies.  Oncology follow up on Monday.    11:53 PM:  I have discussed the diagnosis/risks/treatment options with the patient and family and believe the pt  to be eligible for discharge home to follow-up with Oncology. We also discussed returning to the ED immediately if new or worsening sx occur. We discussed the sx which are most concerning (e.g., sudden worsening pain, fever, inability to tolerate by mouth) that necessitate immediate return. Medications administered to the patient during their visit and any new prescriptions provided to the patient are listed below.  Medications given during this visit Medications  ondansetron (ZOFRAN-ODT) disintegrating tablet 4 mg (4 mg Oral Given 08/06/17 1829)  sodium chloride 0.9 % bolus 1,000 mL (0 mLs Intravenous Stopped 08/06/17 2042)  sodium chloride 0.9 % bolus 1,000 mL (0 mLs Intravenous Stopped 08/06/17 2148)  iopamidol (ISOVUE-300) 61 % injection (80 mLs Intravenous Contrast Given 08/06/17 1857)  lidocaine-EPINEPHrine (XYLOCAINE W/EPI) 1 %-1:100000 (with pres) injection 20 mL (20 mLs Intradermal Given 08/06/17 2043)  morphine 4 MG/ML injection 4 mg (4 mg Intravenous Given 08/06/17 2352)  ondansetron (ZOFRAN) injection 4 mg (4 mg Intravenous Given 08/06/17 2352)     The patient appears reasonably screen and/or stabilized for discharge and I doubt any other medical condition or other [facility] requiring further screening, evaluation, or treatment in the ED at this time prior to discharge.    Final Clinical Impressions(s) / ED Diagnoses   Final diagnoses:  Malignant ascites  Generalized abdominal pain  Vomiting in adult    New Prescriptions Current Discharge Medication List       Deno Etienne, DO 08/06/17 2353

## 2017-08-07 ENCOUNTER — Encounter (HOSPITAL_COMMUNITY): Payer: Self-pay | Admitting: *Deleted

## 2017-08-07 ENCOUNTER — Inpatient Hospital Stay (HOSPITAL_COMMUNITY)
Admission: EM | Admit: 2017-08-07 | Discharge: 2017-08-21 | DRG: 388 | Disposition: A | Discharge status: Hospice | Payer: Medicaid Other | Attending: Internal Medicine | Admitting: Internal Medicine

## 2017-08-07 ENCOUNTER — Emergency Department (HOSPITAL_COMMUNITY): Payer: Medicaid Other

## 2017-08-07 DIAGNOSIS — K56609 Unspecified intestinal obstruction, unspecified as to partial versus complete obstruction: Secondary | ICD-10-CM | POA: Clinically undetermined

## 2017-08-07 DIAGNOSIS — Z66 Do not resuscitate: Secondary | ICD-10-CM | POA: Diagnosis present

## 2017-08-07 DIAGNOSIS — R0600 Dyspnea, unspecified: Secondary | ICD-10-CM

## 2017-08-07 DIAGNOSIS — C799 Secondary malignant neoplasm of unspecified site: Secondary | ICD-10-CM

## 2017-08-07 DIAGNOSIS — R451 Restlessness and agitation: Secondary | ICD-10-CM | POA: Diagnosis not present

## 2017-08-07 DIAGNOSIS — C786 Secondary malignant neoplasm of retroperitoneum and peritoneum: Secondary | ICD-10-CM | POA: Diagnosis present

## 2017-08-07 DIAGNOSIS — R627 Adult failure to thrive: Secondary | ICD-10-CM | POA: Diagnosis present

## 2017-08-07 DIAGNOSIS — R140 Abdominal distension (gaseous): Secondary | ICD-10-CM

## 2017-08-07 DIAGNOSIS — Z681 Body mass index (BMI) 19 or less, adult: Secondary | ICD-10-CM

## 2017-08-07 DIAGNOSIS — G893 Neoplasm related pain (acute) (chronic): Secondary | ICD-10-CM | POA: Diagnosis present

## 2017-08-07 DIAGNOSIS — E860 Dehydration: Secondary | ICD-10-CM | POA: Diagnosis present

## 2017-08-07 DIAGNOSIS — R112 Nausea with vomiting, unspecified: Secondary | ICD-10-CM | POA: Diagnosis present

## 2017-08-07 DIAGNOSIS — E43 Unspecified severe protein-calorie malnutrition: Secondary | ICD-10-CM | POA: Diagnosis present

## 2017-08-07 DIAGNOSIS — K9413 Enterostomy malfunction: Secondary | ICD-10-CM | POA: Diagnosis present

## 2017-08-07 DIAGNOSIS — R188 Other ascites: Secondary | ICD-10-CM

## 2017-08-07 DIAGNOSIS — K5669 Other partial intestinal obstruction: Principal | ICD-10-CM | POA: Diagnosis present

## 2017-08-07 DIAGNOSIS — D630 Anemia in neoplastic disease: Secondary | ICD-10-CM | POA: Diagnosis present

## 2017-08-07 DIAGNOSIS — Z515 Encounter for palliative care: Secondary | ICD-10-CM | POA: Diagnosis present

## 2017-08-07 DIAGNOSIS — Z79899 Other long term (current) drug therapy: Secondary | ICD-10-CM

## 2017-08-07 DIAGNOSIS — N132 Hydronephrosis with renal and ureteral calculous obstruction: Secondary | ICD-10-CM | POA: Diagnosis present

## 2017-08-07 DIAGNOSIS — C169 Malignant neoplasm of stomach, unspecified: Secondary | ICD-10-CM | POA: Diagnosis present

## 2017-08-07 DIAGNOSIS — C163 Malignant neoplasm of pyloric antrum: Secondary | ICD-10-CM

## 2017-08-07 DIAGNOSIS — D509 Iron deficiency anemia, unspecified: Secondary | ICD-10-CM | POA: Diagnosis present

## 2017-08-07 DIAGNOSIS — R180 Malignant ascites: Secondary | ICD-10-CM | POA: Diagnosis present

## 2017-08-07 LAB — COMPREHENSIVE METABOLIC PANEL
ALT: 25 U/L (ref 14–54)
AST: 31 U/L (ref 15–41)
Albumin: 2.2 g/dL — ABNORMAL LOW (ref 3.5–5.0)
Alkaline Phosphatase: 62 U/L (ref 38–126)
Anion gap: 8 (ref 5–15)
BUN: 10 mg/dL (ref 6–20)
CHLORIDE: 107 mmol/L (ref 101–111)
CO2: 26 mmol/L (ref 22–32)
CREATININE: 0.42 mg/dL — AB (ref 0.44–1.00)
Calcium: 7.9 mg/dL — ABNORMAL LOW (ref 8.9–10.3)
GFR calc Af Amer: >60 mL/min (ref >60)
GLUCOSE: 136 mg/dL — AB (ref 65–99)
Potassium: 4 mmol/L (ref 3.5–5.1)
SODIUM: 141 mmol/L (ref 135–145)
Total Bilirubin: 0.4 mg/dL (ref 0.3–1.2)
Total Protein: 6 g/dL — ABNORMAL LOW (ref 6.5–8.1)

## 2017-08-07 LAB — GRAM STAIN: Special Requests: NORMAL

## 2017-08-07 LAB — LACTATE DEHYDROGENASE, PLEURAL OR PERITONEAL FLUID: LD FL: 206 U/L — AB (ref 3–23)

## 2017-08-07 LAB — CBC WITH DIFFERENTIAL/PLATELET
Basophils Absolute: 0 10*3/uL (ref 0.0–0.1)
Basophils Relative: 0 %
EOS PCT: 0 %
Eosinophils Absolute: 0 10*3/uL (ref 0.0–0.7)
HCT: 34.2 % — ABNORMAL LOW (ref 36.0–46.0)
Hemoglobin: 11 g/dL — ABNORMAL LOW (ref 12.0–15.0)
LYMPHS ABS: 0.9 10*3/uL (ref 0.7–4.0)
LYMPHS PCT: 11 %
MCH: 28 pg (ref 26.0–34.0)
MCHC: 32.2 g/dL (ref 30.0–36.0)
MCV: 87 fL (ref 78.0–100.0)
MONO ABS: 0.6 10*3/uL (ref 0.1–1.0)
MONOS PCT: 7 %
Neutro Abs: 7 10*3/uL (ref 1.7–7.7)
Neutrophils Relative %: 82 %
PLATELETS: 428 10*3/uL — AB (ref 150–400)
RBC: 3.93 MIL/uL (ref 3.87–5.11)
RDW: 15.6 % — AB (ref 11.5–15.5)
WBC: 8.5 10*3/uL (ref 4.0–10.5)

## 2017-08-07 LAB — BODY FLUID CELL COUNT WITH DIFFERENTIAL
Lymphs, Fluid: 55 %
Monocyte-Macrophage-Serous Fluid: 42 % — ABNORMAL LOW (ref 50–90)
Neutrophil Count, Fluid: 3 % (ref 0–25)
WBC FLUID: 468 uL (ref 0–1000)

## 2017-08-07 LAB — LIPASE, BLOOD: Lipase: 211 U/L — ABNORMAL HIGH (ref 11–51)

## 2017-08-07 LAB — PROTIME-INR
INR: 1.05
Prothrombin Time: 13.6 seconds (ref 11.4–15.2)

## 2017-08-07 LAB — ALBUMIN, PLEURAL OR PERITONEAL FLUID: Albumin, Fluid: 1.7 g/dL

## 2017-08-07 LAB — I-STAT CG4 LACTIC ACID, ED: Lactic Acid, Venous: 0.74 mmol/L (ref 0.5–1.9)

## 2017-08-07 LAB — GLUCOSE, PLEURAL OR PERITONEAL FLUID: Glucose, Fluid: 91 mg/dL

## 2017-08-07 LAB — PROTEIN, PLEURAL OR PERITONEAL FLUID: TOTAL PROTEIN, FLUID: 3.9 g/dL

## 2017-08-07 LAB — AMMONIA: AMMONIA: 15 umol/L (ref 9–35)

## 2017-08-07 MED ORDER — ACETAMINOPHEN 325 MG PO TABS: 650.0000 mg | ORAL_TABLET | Freq: Four times a day (QID) | ORAL | Status: DC | PRN

## 2017-08-07 MED ORDER — PROMETHAZINE HCL 25 MG/ML IJ SOLN
12.5000 mg | Freq: Once | INTRAMUSCULAR | Status: AC
  Administered 2017-08-07: 12.5 mg via INTRAVENOUS
  Filled 2017-08-07: qty 1

## 2017-08-07 MED ORDER — PROMETHAZINE HCL 25 MG/ML IJ SOLN
12.5000 mg | INTRAMUSCULAR | Status: DC | PRN
  Administered 2017-08-07 – 2017-08-15 (×11): 25 mg via INTRAVENOUS
  Filled 2017-08-07 (×11): qty 1

## 2017-08-07 MED ORDER — ENOXAPARIN SODIUM 40 MG/0.4ML ~~LOC~~ SOLN
40.0000 mg | Freq: Every day | SUBCUTANEOUS | Status: DC
  Administered 2017-08-07 – 2017-08-14 (×8): 40 mg via SUBCUTANEOUS
  Filled 2017-08-07 (×8): qty 0.4

## 2017-08-07 MED ORDER — ONDANSETRON HCL 4 MG PO TABS: 4.0000 mg | ORAL_TABLET | Freq: Four times a day (QID) | ORAL | Status: DC | PRN

## 2017-08-07 MED ORDER — ACETAMINOPHEN 650 MG RE SUPP: 650.0000 mg | Freq: Four times a day (QID) | RECTAL | Status: DC | PRN

## 2017-08-07 MED ORDER — LIDOCAINE-PRILOCAINE 2.5-2.5 % EX CREA: TOPICAL_CREAM | Freq: Once | CUTANEOUS | Status: DC

## 2017-08-07 MED ORDER — LACTATED RINGERS IV SOLN
INTRAVENOUS | Status: DC
  Administered 2017-08-07 – 2017-08-08 (×2): via INTRAVENOUS

## 2017-08-07 MED ORDER — MORPHINE SULFATE (PF) 4 MG/ML IV SOLN
4.0000 mg | Freq: Once | INTRAVENOUS | Status: AC
  Administered 2017-08-07: 4 mg via INTRAVENOUS
  Filled 2017-08-07: qty 1

## 2017-08-07 MED ORDER — SODIUM CHLORIDE 0.9 % IV BOLUS (SEPSIS)
1000.0000 mL | Freq: Once | INTRAVENOUS | Status: AC
  Administered 2017-08-07: 1000 mL via INTRAVENOUS

## 2017-08-07 MED ORDER — ONDANSETRON HCL 4 MG/2ML IJ SOLN
4.0000 mg | Freq: Once | INTRAMUSCULAR | Status: AC
  Administered 2017-08-07: 4 mg via INTRAVENOUS
  Filled 2017-08-07: qty 2

## 2017-08-07 MED ORDER — MORPHINE SULFATE (PF) 2 MG/ML IV SOLN
2.0000 mg | INTRAVENOUS | Status: DC | PRN
  Administered 2017-08-07: 4 mg via INTRAVENOUS
  Filled 2017-08-07: qty 2

## 2017-08-07 MED ORDER — ONDANSETRON HCL 4 MG/2ML IJ SOLN
4.0000 mg | Freq: Four times a day (QID) | INTRAMUSCULAR | Status: DC | PRN
  Administered 2017-08-08 – 2017-08-12 (×8): 4 mg via INTRAVENOUS
  Filled 2017-08-07 (×7): qty 2

## 2017-08-07 NOTE — H&P (Signed)
History and Physical    [name] TDD:[id number] DOB: May 31, 1976 DOA: 08/07/2017  PCP: Patient, No Pcp Per Consultants:  Paulita Fujita - GIBurr Medico - oncology; Barry Dienes - surgery   Chief Complaint: refractory n/v  HPI: Claire Bray is a 41 y.o. 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  History was obtained solely from Epic and the ER physician since the patient is obtunded and non-English-speaking and is currently unaccompanied.    She was last seen by oncology on 9/24.  She has completed 3 cycles of palliative chemotherapy with FOLFOX and appears to have progressive disease.  She has a feeding tube.  She was seen in the ER yesterday for 3 days of persistent n/v.  She had a CT which showed hydronephrosis thought to be resulting from mass effect from her ascites; progressive malignant ascites; probable omental and pelvic peritoneal metastatic disease; and possible cervical mass or metastatic disease obscuring the cervix.  She had a 3L paracentesis with improvement in her symptoms and cessation of vomiting.  She was discharged about midnight and slept well.  She began vomiting again today with c/o abdominal pain.  The family asked about hospice.    ED Course: 1L bolus; Zofran, morphine, phenergan.  Suggest admission, palliative care consultation.  Review of Systems: Unable to perform   Past Medical History:  Diagnosis Date  . Cancer (HCC)    STOMACH  . No pertinent past medical history     Past Surgical History:  Procedure Laterality Date  . GASTROSTOMY N/A 04/20/2017   Procedure: INSERTION OF J TUBE;  Surgeon: Stark Klein, MD;  Location: [place];  Service: General;  Laterality: N/A;  . NO PAST SURGERIES    . PORTACATH PLACEMENT Left 04/20/2017   Procedure: INSERTION PORT-A-CATH;  Surgeon: Stark Klein, MD;  Location: [place];  Service: General;  Laterality: Left;      Social History   Social History  . Marital status: Married    Spouse name: N/A  . Number of children: N/A  . Years of education: N/A   Occupational History  . Not on file.   Social History Main Topics  . Smoking status: Never Smoker  . Smokeless tobacco: Never Used  . Alcohol use No  . Drug use: No  . Sexual activity: Not Currently   Other Topics Concern  . Not on file   Social History Narrative   ** Merged History Encounter **        No Known Allergies  Family History  Problem Relation Age of Onset  . Hypertension Mother   . Heart disease Mother   . Other Neg Hx     Prior to Admission medications   Medication Sig Start Date End Date Taking? Authorizing Provider  dicyclomine (BENTYL) 20 MG tablet Take 1 tablet (20 mg total) by mouth 2 (two) times daily. Patient not taking: Reported on 08/06/2017 04/02/17   Montine Circle, PA-C  HYDROcodone-acetaminophen (HYCET) 7.5-325 mg/15 ml solution Take 10 mLs by mouth every 6 (six) hours as needed for moderate pain. Patient not taking: Reported on 08/06/2017 08/05/17   Truitt Merle, MD  HYDROcodone-acetaminophen (NORCO/VICODIN) 5-325 MG tablet Take 1-2 tablets by mouth every 6 (six) hours as needed for pain. 08/01/17   [provider]  ibuprofen (ADVIL,MOTRIN) 600 MG tablet Take 1 tablet (600 mg total) by mouth every 6 (six) hours. Patient not taking: Reported on  08/06/2017 12/14/12   Hess, Tamela Oddi, DO  lidocaine-prilocaine (EMLA) cream Apply to portacath site 1 hour prior to use 04/27/17   Owens Shark, NP  metoCLOPramide (REGLAN) 5 MG tablet Take 2 tablets (10 mg total) by mouth 4 (four) times daily -  before meals and at bedtime. Patient not taking: Reported on 08/06/2017 04/06/17   Marcille Buffy D, CNM  Nutritional Supplements (FEEDING SUPPLEMENT, OSMOLITE 1.2 CAL,) LIQD Place 1,000 mLs into feeding tube continuous. Patient not taking: Reported on 08/06/2017 04/24/17   Stark Klein, MD  ondansetron (ZOFRAN) 8 MG tablet  Take 1 tablet (8 mg total) by mouth every 8 (eight) hours as needed for nausea or vomiting. 08/06/17   Deno Etienne, DO  pantoprazole (PROTONIX) 40 MG tablet Take 1 tablet (40 mg total) by mouth daily at 12 noon. Patient not taking: Reported on 08/06/2017 04/24/17   Stark Klein, MD  polyethylene glycol [facility]) packet Take half a pack (8.5 oz) every 12 hours. Adjust as needed for soft stools Patient not taking: Reported on 08/06/2017 04/29/17   Payton Emerald, MD  promethazine (PHENERGAN) 25 MG suppository Place 1 suppository (25 mg total) rectally every 6 (six) hours as needed for nausea. 04/17/17   Varney Biles, MD  zolpidem (AMBIEN) 5 MG tablet Take 1 tablet (5 mg total) by mouth at bedtime as needed for sleep. Patient not taking: Reported on 08/06/2017 12/11/12   Martha Clan, MD    Physical Exam: Vitals:   08/07/17 1935 08/07/17 1941 08/07/17 2000 08/07/17 2030  BP: (!) 157/112 (!) 160/114 (!) 154/113 (!) 164/109  Pulse: 98 97 100 (!) 102  Resp: (!) 23 (!) 22 (!) 21 (!) 25  Temp:      TempSrc:      SpO2: 100% 100% 100% 100%  Weight:      Height:         General: Obtunded, mostly comfortable with occasional movement and clearly not willing to let the emesis bag stray far Eyes:  normal lids ENT:  Mildly dry lips & tongue, mildly dry mm Neck:  no LAD, masses or thyromegaly; no carotid bruits Cardiovascular:  RRR, no m/r/g. No LE edema.  Respiratory:   CTA bilaterally with no wheezes/rales/rhonchi.  Normal respiratory effort. Abdomen: Very firm, feeding tube present Skin:  no rash or induration seen on limited exam Musculoskeletal:  grossly normal tone BUE/BLE, good passive ROM, no bony abnormality Psychiatric: obtunded, ill Neurologic: unable to assess    Radiological Exams on Admission: Ct Abdomen Pelvis W Contrast  Result Date: 08/06/2017 CLINICAL DATA:  Nausea and vomiting. Abdominal distention. Undergoing chemotherapy for gastric cancer. EXAM: CT ABDOMEN AND PELVIS WITH CONTRAST  TECHNIQUE: Multidetector CT imaging of the abdomen and pelvis was performed using the standard protocol following bolus administration of intravenous contrast. CONTRAST:  80 cc Isovue-300 COMPARISON:  08/01/2017 FINDINGS: Lower chest: Clear lung bases. Hepatobiliary: Progressive flattening of the lateral margins of the liver by the adjacent ascites. Contracted gallbladder. Pancreas: Flattened pancreatic margins by the ascites. Spleen: Flattened margins by the ascites. Adrenals/Urinary Tract: 2 small left renal calculi. Interval mild to moderate dilatation of the right renal collecting system and proximal ureter with delayed excretion of contrast compared to the left. No obstructing calculus seen. Minimal urine in the bladder. The bladder is compressed by the large amount of ascites. Normal appearing adrenal glands. Stomach/Bowel: Diffuse low-density gastric wall thickening without significant change. This remains most pronounced involving the antrum. Multiple dilated loops of proximal jejunum with mild progression.  A percutaneous jejunal catheter remains in place. Normal caliber colon and distal small bowel. Vascular/Lymphatic: Mildly prominent retroperitoneal lymph nodes without significant change. No arterial calcifications are aneurysm. Reproductive: Heterogeneous mass-like enlargement of the cervix, measuring 7.1 x 4.3 cm on image number 83 of series 2. The uterus is also mildly heterogeneous and located in the pelvis on the left. There is more ill-defined, heterogeneous, soft tissue density in the posterior aspect of the pelvic ascites with one area measuring 6.5 x 3.7 cm on image number 73 of series 2 and another measuring 2.9 x 1.2 cm on image number 74 of series 2. The latter area may represent the right ovary. The left ovary is grossly unremarkable. Other: Large amount of abdominal and pelvic ascites, mildly increased. There is associated centralization of bowel loops. There is also omental heterogeneous  soft tissue density currently. Anteriorly on the right, this measures 9.1 x 3.2 cm on image number 44 of series 2 on the left, this measures 3.1 x 1.6 cm on image number 42 of series 2. Musculoskeletal: Mild lumbar spine degenerative changes. IMPRESSION: 1. Interval mild to moderate right hydronephrosis and proximal right hydroureter. This is concerning for ureteral obstruction by mass effect produced by the patient's large amount of ascites. 2. Mildly progressive malignant ascites in the abdomen pelvis with mass effect, as described above. 3. 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 4. Possible cervical mass or metastatic disease obscuring the cervix. 5. Stable low-density gastric wall thickening compatible with the patient's known gastric malignancy. 6. Two small, nonobstructing left renal calculi. 7. Borderline enlarged retroperitoneal lymph nodes. Electronically Signed   By: Claudie Revering M.D.   On: 08/06/2017 19:56   Dg Abdomen Acute W/chest  Result Date: 08/07/2017 CLINICAL DATA:  Abdominal pain, nausea and vomiting. Malignant ascites. Lethargy. Currently being treated for gastric cancer. EXAM: DG ABDOMEN ACUTE W/ 1V CHEST COMPARISON:  Abdomen pelvis CT obtained yesterday. Chest, abdomen pelvis CT dated 08/01/2017. FINDINGS: Normal sized heart. Clear lungs. Left subclavian porta catheter tip in the inferior aspect of the superior vena cava. Again demonstrated are mildly dilated loops of jejunum with a jejunal catheter in place. These contain air-fluid levels. No free peritoneal air. There is less central is a shin of the bowel loops following large volume paracentesis yesterday. Unremarkable bones. IMPRESSION: 1. No significant change in mild jejunal ileus or partial obstruction. 2. Decreased amount of ascites following paracentesis yesterday. 3. No acute chest abnormality. Electronically Signed   By: Claudie Revering M.D.   On: 08/07/2017 19:02    EKG: Not done   Labs on Admission: I  have personally reviewed the available labs and imaging studies at the time of the admission.  Pertinent labs:   Glucose 136 Albumin 2.2 Lipase 211 Lactate 0.74 WBC 8.5 Hgb 11.0 - stable  Assessment/Plan Principal Problem:   Refractory nausea and vomiting Active Problems:   Gastric cancer (HCC)   Protein-calorie malnutrition, severe   Anemia in neoplastic disease   -Tragic patient with recent diagnosis of gastric cancer in 6/18, widely metastatic and apparently not responsive to palliative chemotherapy -Based on Dr. Ernestina Penna last note and current imaging as well as back-to-back presentations, it appears that the patient is rapidly declining -Dr. Gilford Raid notes that the family is very interested in pursuing hospice and this seems quite appropriate; she may even be a candidate for inpatient hospice -However, the patient has small children (ages 89 and 2) and family was not available to discuss with me at the time  of admission -Will observe overnight -Morphine for pain control -Zofran and phenergan for n/v -Palliative care consult in the AM -Could also request oncology consultation if needed; will add Dr. Burr Medico to the treatment team. -For now, she remains a full code and comfort care order set was not utilized since family was not available to discuss.  However, transition tomorrow appears to be quite reasonable in this unfortunate situation.   DVT prophylaxis:  Lovenox  Code Status:  Full Family Communication: None present  Disposition Plan: To be determined Consults called: Palliative care; oncology added to treatment team Admission status:  It is my clinical opinion that referral for OBSERVATION is reasonable and necessary in this patient based on the above information provided. The aforementioned taken together are felt to place the patient at high risk for further clinical deterioration. However it is anticipated that the patient may be medically stable for discharge from the  hospital within 24 to 48 hours.     Karmen Bongo MD [facility]  If note is complete, please contact covering daytime or nighttime physician. [email or web address] Password [facility]  08/07/2017, 9:46 PM

## 2017-08-07 NOTE — ED Notes (Signed)
Bed: XI71 Expected date:  Expected time:  Means of arrival:  Comments: N/V abd pain Ca Pt

## 2017-08-07 NOTE — ED Provider Notes (Signed)
[facility] DEPT Provider Note   CSN: [id number] Arrival date & time: 08/07/17  1725     History   Chief Complaint Chief Complaint  Patient presents with  . Emesis  . Nausea    HPI Claire Bray is a 41 y.o. female.  Pt presents to the ED today with severe abd pain and n/v.  She is an unfortunate 41 year old female with metastatic gastric adenocarcinoma diagnosed in June.  Surgery was not indicated due to the metastatic disease.  Chemo has been started.  First dose 7/3.  The pt was recently diagnosed with ascites.  She has a paracentesis on 9/23 and yesterday (in the ED).  Pt speaks [place] and pt's husband said she did well after the paracentesis and slept well last night.  She started vomiting again today.  EMS said her tube feedings were hooked up when they arrived.  She had a ct scan on 9/24 which showed a possible ovarian mass and an [place] has been ordered.  She had a repeat CT scan last night which showed interval hydronephrosis on the right thought to be from mass effect from the ascites.  Pt's husband said she's had her 3rd round of chemo and does extremely poorly with it.  He asked about hospice.          Past Medical History:  Diagnosis Date  . Cancer (HCC)    STOMACH  . No pertinent past medical history     Patient Active Problem List   Diagnosis Date Noted  . 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 Goals of care, counseling/discussion 05/11/2017  . Peritoneal carcinomatosis ([place]) 05/07/2017  . Port catheter in place 04/27/2017  . Protein-calorie malnutrition, severe 04/20/2017  . Gastric cancer ([place]) 04/18/2017  . Helicobacter pylori gastritis 01/12/2017    Past Surgical History:  Procedure Laterality Date  . GASTROSTOMY N/A 04/20/2017   Procedure: INSERTION OF J TUBE;  Surgeon: Stark Klein, MD;  Location: [place];  Service: General;  Laterality: N/A;  . NO PAST SURGERIES    . PORTACATH PLACEMENT Left 04/20/2017   Procedure: INSERTION  PORT-A-CATH;  Surgeon: Stark Klein, MD;  Location: [place];  Service: General;  Laterality: Left;    OB History    Gravida Para Term Preterm AB Living   2 2 2  0 0 1   SAB TAB Ectopic Multiple Live Births   0 0 0   1       Home Medications    Prior to Admission medications   Medication Sig Start Date End Date Taking? Authorizing Provider  dicyclomine (BENTYL) 20 MG tablet Take 1 tablet (20 mg total) by mouth 2 (two) times daily. Patient not taking: Reported on 08/06/2017 04/02/17   Montine Circle, PA-C  HYDROcodone-acetaminophen (HYCET) 7.5-325 mg/15 ml solution Take 10 mLs by mouth every 6 (six) hours as needed for moderate pain. Patient not taking: Reported on 08/06/2017 08/05/17   Truitt Merle, MD  HYDROcodone-acetaminophen (NORCO/VICODIN) 5-325 MG tablet Take 1-2 tablets by mouth every 6 (six) hours as needed for pain. 08/01/17   [provider]  ibuprofen (ADVIL,MOTRIN) 600 MG tablet Take 1 tablet (600 mg total) by mouth every 6 (six) hours. Patient not taking: Reported on 08/06/2017 12/14/12   Nolon Rod, DO  lidocaine-prilocaine (EMLA) cream Apply to portacath site 1 hour prior to use 04/27/17   Owens Shark, NP  metoCLOPramide (REGLAN) 5 MG tablet Take 2 tablets (10 mg total) by mouth 4 (four) times  daily -  before meals and at bedtime. Patient not taking: Reported on 08/06/2017 04/06/17   Marcille Buffy D, CNM  Nutritional Supplements (FEEDING SUPPLEMENT, OSMOLITE 1.2 CAL,) LIQD Place 1,000 mLs into feeding tube continuous. Patient not taking: Reported on 08/06/2017 04/24/17   Stark Klein, MD  ondansetron (ZOFRAN) 8 MG tablet Take 1 tablet (8 mg total) by mouth every 8 (eight) hours as needed for nausea or vomiting. 08/06/17   Deno Etienne, DO  pantoprazole (PROTONIX) 40 MG tablet Take 1 tablet (40 mg total) by mouth daily at 12 noon. Patient not taking: Reported on 08/06/2017 04/24/17   Stark Klein, MD  polyethylene glycol [facility]) packet Take half a pack (8.5 oz) every 12  hours. Adjust as needed for soft stools Patient not taking: Reported on 08/06/2017 04/29/17   Payton Emerald, MD  promethazine (PHENERGAN) 25 MG suppository Place 1 suppository (25 mg total) rectally every 6 (six) hours as needed for nausea. 04/17/17   Varney Biles, MD  zolpidem (AMBIEN) 5 MG tablet Take 1 tablet (5 mg total) by mouth at bedtime as needed for sleep. Patient not taking: Reported on 08/06/2017 12/11/12   Martha Clan, MD    Family History Family History  Problem Relation Age of Onset  . Hypertension Mother   . Heart disease Mother   . Other Neg Hx     Social History Social History  Substance Use Topics  . Smoking status: Never Smoker  . Smokeless tobacco: Never Used  . Alcohol use No     Allergies   Patient has no known allergies.   Review of Systems Review of Systems  Gastrointestinal: Positive for abdominal pain, nausea and vomiting.  All other systems reviewed and are negative.    Physical Exam Updated Vital Signs BP (!) 164/109   Pulse (!) 102   Temp 98.2 F (36.8 C) (Oral)   Resp (!) 25   Ht 5' 4.75" (1.645 m)   Wt 49.9 kg (110 lb)   LMP 07/23/2017 Comment: on chemo  SpO2 100%   BMI 18.45 kg/m   Physical Exam  Constitutional: She appears distressed.  HENT:  Head: Normocephalic and atraumatic.  Right Ear: External ear normal.  Left Ear: External ear normal.  Nose: Nose normal.  Mouth/Throat: Mucous membranes are dry.  Eyes: Pupils are equal, round, and reactive to light. Conjunctivae and EOM are normal.  Neck: Normal range of motion. Neck supple.  Cardiovascular: Regular rhythm, normal heart sounds and intact distal pulses.  Tachycardia present.   Pulmonary/Chest: Effort normal and breath sounds normal.  Abdominal: She exhibits distension. There is tenderness.  Musculoskeletal: Normal range of motion.  Neurological: She is alert.  Skin: Skin is warm.  Nursing note and vitals reviewed.    ED Treatments / Results  Labs (all labs ordered  are listed, but only abnormal results are displayed) Labs Reviewed  COMPREHENSIVE METABOLIC PANEL - Abnormal; Notable for the following:       Result Value   Glucose, Bld 136 (*)    Creatinine, Ser 0.42 (*)    Calcium 7.9 (*)    Total Protein 6.0 (*)    Albumin 2.2 (*)    All other components within normal limits  CBC WITH DIFFERENTIAL/PLATELET - Abnormal; Notable for the following:    Hemoglobin 11.0 (*)    HCT 34.2 (*)    RDW 15.6 (*)    Platelets 428 (*)    All other components within normal limits  LIPASE, BLOOD - Abnormal; Notable for  the following:    Lipase 211 (*)    All other components within normal limits  CULTURE, BLOOD (ROUTINE X 2)  CULTURE, BLOOD (ROUTINE X 2)  PROTIME-INR  AMMONIA  URINALYSIS, ROUTINE W REFLEX MICROSCOPIC  I-STAT CG4 LACTIC ACID, ED    EKG  EKG Interpretation None       Radiology Ct Abdomen Pelvis W Contrast  Result Date: 08/06/2017 CLINICAL DATA:  Nausea and vomiting. Abdominal distention. Undergoing chemotherapy for gastric cancer. EXAM: CT ABDOMEN AND PELVIS WITH CONTRAST TECHNIQUE: Multidetector CT imaging of the abdomen and pelvis was performed using the standard protocol following bolus administration of intravenous contrast. CONTRAST:  80 cc Isovue-300 COMPARISON:  08/01/2017 FINDINGS: Lower chest: Clear lung bases. Hepatobiliary: Progressive flattening of the lateral margins of the liver by the adjacent ascites. Contracted gallbladder. Pancreas: Flattened pancreatic margins by the ascites. Spleen: Flattened margins by the ascites. Adrenals/Urinary Tract: 2 small left renal calculi. Interval mild to moderate dilatation of the right renal collecting system and proximal ureter with delayed excretion of contrast compared to the left. No obstructing calculus seen. Minimal urine in the bladder. The bladder is compressed by the large amount of ascites. Normal appearing adrenal glands. Stomach/Bowel: Diffuse low-density gastric wall thickening  without significant change. This remains most pronounced involving the antrum. Multiple dilated loops of proximal jejunum with mild progression. A percutaneous jejunal catheter remains in place. Normal caliber colon and distal small bowel. Vascular/Lymphatic: Mildly prominent retroperitoneal lymph nodes without significant change. No arterial calcifications are aneurysm. Reproductive: Heterogeneous mass-like enlargement of the cervix, measuring 7.1 x 4.3 cm on image number 83 of series 2. The uterus is also mildly heterogeneous and located in the pelvis on the left. There is more ill-defined, heterogeneous, soft tissue density in the posterior aspect of the pelvic ascites with one area measuring 6.5 x 3.7 cm on image number 73 of series 2 and another measuring 2.9 x 1.2 cm on image number 74 of series 2. The latter area may represent the right ovary. The left ovary is grossly unremarkable. Other: Large amount of abdominal and pelvic ascites, mildly increased. There is associated centralization of bowel loops. There is also omental heterogeneous soft tissue density currently. Anteriorly on the right, this measures 9.1 x 3.2 cm on image number 44 of series 2 on the left, this measures 3.1 x 1.6 cm on image number 42 of series 2. Musculoskeletal: Mild lumbar spine degenerative changes. IMPRESSION: 1. Interval mild to moderate right hydronephrosis and proximal right hydroureter. This is concerning for ureteral obstruction by mass effect produced by the patient's large amount of ascites. 2. Mildly progressive malignant ascites in the abdomen pelvis with mass effect, as described above. 3. 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 4. Possible cervical mass or metastatic disease obscuring the cervix. 5. Stable low-density gastric wall thickening compatible with the patient's known gastric malignancy. 6. Two small, nonobstructing left renal calculi. 7. Borderline enlarged retroperitoneal lymph nodes.  Electronically Signed   By: Claudie Revering M.D.   On: 08/06/2017 19:56   Dg Abdomen Acute W/chest  Result Date: 08/07/2017 CLINICAL DATA:  Abdominal pain, nausea and vomiting. Malignant ascites. Lethargy. Currently being treated for gastric cancer. EXAM: DG ABDOMEN ACUTE W/ 1V CHEST COMPARISON:  Abdomen pelvis CT obtained yesterday. Chest, abdomen pelvis CT dated 08/01/2017. FINDINGS: Normal sized heart. Clear lungs. Left subclavian porta catheter tip in the inferior aspect of the superior vena cava. Again demonstrated are mildly dilated loops of jejunum with a jejunal catheter in  place. These contain air-fluid levels. No free peritoneal air. There is less central is a shin of the bowel loops following large volume paracentesis yesterday. Unremarkable bones. IMPRESSION: 1. No significant change in mild jejunal ileus or partial obstruction. 2. Decreased amount of ascites following paracentesis yesterday. 3. No acute chest abnormality. Electronically Signed   By: Claudie Revering M.D.   On: 08/07/2017 19:02    Procedures Procedures (including critical care time)  Medications Ordered in ED Medications  sodium chloride 0.9 % bolus 1,000 mL (0 mLs Intravenous Stopped 08/07/17 1925)  ondansetron (ZOFRAN) injection 4 mg (4 mg Intravenous Given 08/07/17 1745)  morphine 4 MG/ML injection 4 mg (4 mg Intravenous Given 08/07/17 1745)  promethazine (PHENERGAN) injection 12.5 mg (12.5 mg Intravenous Given 08/07/17 1941)     Initial Impression / Assessment and Plan / ED Course  I have reviewed the triage vital signs and the nursing notes.  Pertinent labs & imaging results that were available during my care of the patient were reviewed by me and considered in my medical decision making (see chart for details).   Pt is not doing well at home.  She is more comfortable now, but I thinks she would do better with IV hydration, pain meds, and antiemetics and admission.  Possible palliative care consult as well.  Pt d/w Dr.  Lorin Mercy ([facility]). 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

## 2017-08-07 NOTE — ED Triage Notes (Signed)
EMS reports pt was seen yesterday for same, tapped and removed 3 bottles of fluid due to abd ascites. Pt  Rested last night and has declined today with lethargacy nausea and vomiting.

## 2017-08-08 DIAGNOSIS — K9413 Enterostomy malfunction: Secondary | ICD-10-CM | POA: Diagnosis present

## 2017-08-08 DIAGNOSIS — D630 Anemia in neoplastic disease: Secondary | ICD-10-CM | POA: Diagnosis present

## 2017-08-08 DIAGNOSIS — R180 Malignant ascites: Secondary | ICD-10-CM | POA: Diagnosis present

## 2017-08-08 DIAGNOSIS — N1330 Unspecified hydronephrosis: Secondary | ICD-10-CM | POA: Diagnosis not present

## 2017-08-08 DIAGNOSIS — D6481 Anemia due to antineoplastic chemotherapy: Secondary | ICD-10-CM | POA: Diagnosis not present

## 2017-08-08 DIAGNOSIS — C799 Secondary malignant neoplasm of unspecified site: Secondary | ICD-10-CM | POA: Diagnosis not present

## 2017-08-08 DIAGNOSIS — R0602 Shortness of breath: Secondary | ICD-10-CM | POA: Diagnosis not present

## 2017-08-08 DIAGNOSIS — R451 Restlessness and agitation: Secondary | ICD-10-CM | POA: Diagnosis not present

## 2017-08-08 DIAGNOSIS — C163 Malignant neoplasm of pyloric antrum: Secondary | ICD-10-CM | POA: Diagnosis present

## 2017-08-08 DIAGNOSIS — R0600 Dyspnea, unspecified: Secondary | ICD-10-CM | POA: Diagnosis not present

## 2017-08-08 DIAGNOSIS — N132 Hydronephrosis with renal and ureteral calculous obstruction: Secondary | ICD-10-CM | POA: Diagnosis present

## 2017-08-08 DIAGNOSIS — C786 Secondary malignant neoplasm of retroperitoneum and peritoneum: Secondary | ICD-10-CM | POA: Diagnosis present

## 2017-08-08 DIAGNOSIS — R112 Nausea with vomiting, unspecified: Secondary | ICD-10-CM | POA: Diagnosis not present

## 2017-08-08 DIAGNOSIS — D509 Iron deficiency anemia, unspecified: Secondary | ICD-10-CM | POA: Diagnosis present

## 2017-08-08 DIAGNOSIS — E43 Unspecified severe protein-calorie malnutrition: Secondary | ICD-10-CM

## 2017-08-08 DIAGNOSIS — G893 Neoplasm related pain (acute) (chronic): Secondary | ICD-10-CM | POA: Diagnosis not present

## 2017-08-08 DIAGNOSIS — R109 Unspecified abdominal pain: Secondary | ICD-10-CM | POA: Diagnosis not present

## 2017-08-08 DIAGNOSIS — Z681 Body mass index (BMI) 19 or less, adult: Secondary | ICD-10-CM | POA: Diagnosis not present

## 2017-08-08 DIAGNOSIS — R0609 Other forms of dyspnea: Secondary | ICD-10-CM | POA: Diagnosis not present

## 2017-08-08 DIAGNOSIS — Z515 Encounter for palliative care: Secondary | ICD-10-CM | POA: Diagnosis not present

## 2017-08-08 DIAGNOSIS — E46 Unspecified protein-calorie malnutrition: Secondary | ICD-10-CM | POA: Diagnosis not present

## 2017-08-08 DIAGNOSIS — Z79899 Other long term (current) drug therapy: Secondary | ICD-10-CM | POA: Diagnosis not present

## 2017-08-08 DIAGNOSIS — R627 Adult failure to thrive: Secondary | ICD-10-CM | POA: Diagnosis present

## 2017-08-08 DIAGNOSIS — K5669 Other partial intestinal obstruction: Secondary | ICD-10-CM | POA: Diagnosis present

## 2017-08-08 DIAGNOSIS — C169 Malignant neoplasm of stomach, unspecified: Secondary | ICD-10-CM | POA: Diagnosis not present

## 2017-08-08 DIAGNOSIS — R110 Nausea: Secondary | ICD-10-CM | POA: Diagnosis not present

## 2017-08-08 DIAGNOSIS — Z66 Do not resuscitate: Secondary | ICD-10-CM | POA: Diagnosis present

## 2017-08-08 DIAGNOSIS — R140 Abdominal distension (gaseous): Secondary | ICD-10-CM | POA: Diagnosis not present

## 2017-08-08 DIAGNOSIS — E860 Dehydration: Secondary | ICD-10-CM | POA: Diagnosis present

## 2017-08-08 LAB — URINALYSIS, ROUTINE W REFLEX MICROSCOPIC
Bilirubin Urine: NEGATIVE
GLUCOSE, UA: NEGATIVE mg/dL
Hgb urine dipstick: NEGATIVE
Ketones, ur: 20 mg/dL — AB
LEUKOCYTES UA: NEGATIVE
NITRITE: NEGATIVE
PH: 5 (ref 5.0–8.0)
Protein, ur: NEGATIVE mg/dL
SPECIFIC GRAVITY, URINE: 1.028 (ref 1.005–1.030)

## 2017-08-08 LAB — CBC
HEMATOCRIT: 32.6 % — AB (ref 36.0–46.0)
HEMOGLOBIN: 10.4 g/dL — AB (ref 12.0–15.0)
MCH: 27.2 pg (ref 26.0–34.0)
MCHC: 31.9 g/dL (ref 30.0–36.0)
MCV: 85.3 fL (ref 78.0–100.0)
Platelets: 388 10*3/uL (ref 150–400)
RBC: 3.82 MIL/uL — ABNORMAL LOW (ref 3.87–5.11)
RDW: 15.5 % (ref 11.5–15.5)
WBC: 4.8 10*3/uL (ref 4.0–10.5)

## 2017-08-08 LAB — BASIC METABOLIC PANEL
ANION GAP: 10 (ref 5–15)
BUN: 12 mg/dL (ref 6–20)
CHLORIDE: 107 mmol/L (ref 101–111)
CO2: 26 mmol/L (ref 22–32)
Calcium: 8.4 mg/dL — ABNORMAL LOW (ref 8.9–10.3)
Creatinine, Ser: 0.52 mg/dL (ref 0.44–1.00)
GFR calc Af Amer: >60 mL/min (ref >60)
GFR calc non Af Amer: >60 mL/min (ref >60)
GLUCOSE: 117 mg/dL — AB (ref 65–99)
POTASSIUM: 4.5 mmol/L (ref 3.5–5.1)
Sodium: 143 mmol/L (ref 135–145)

## 2017-08-08 LAB — GRAM STAIN

## 2017-08-08 MED ORDER — MORPHINE SULFATE (PF) 4 MG/ML IV SOLN
2.0000 mg | INTRAVENOUS | Status: DC | PRN
  Administered 2017-08-08: 2 mg via INTRAVENOUS
  Administered 2017-08-08 – 2017-08-11 (×10): 4 mg via INTRAVENOUS
  Administered 2017-08-11: 2 mg via INTRAVENOUS
  Administered 2017-08-12 (×5): 4 mg via INTRAVENOUS
  Administered 2017-08-13: 2 mg via INTRAVENOUS
  Administered 2017-08-13 – 2017-08-15 (×10): 4 mg via INTRAVENOUS
  Filled 2017-08-08 (×28): qty 1

## 2017-08-08 MED ORDER — SODIUM CHLORIDE 0.9 % IV SOLN
INTRAVENOUS | Status: DC
  Administered 2017-08-08 – 2017-08-11 (×5): via INTRAVENOUS

## 2017-08-08 NOTE — ED Notes (Signed)
Report given to [employer] at 3W.

## 2017-08-08 NOTE — Progress Notes (Signed)
Patient ID: Claire Bray, female   DOB: [birth year], 41 y.o.   MRN: [id number]    PROGRESS NOTE    Claire Bray  BTD:[id number] DOB: Jul 02, 1976 DOA: 08/07/2017  PCP: Patient, No Pcp Per   Brief Narrative:  41 y.o. 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 Per record review, pt has completed three cycles of chemo with FOLFOX. Pt was just seen in ED three days PTA for same concern, at that time she had Ct abd done which was notable for hydronephrosis due to mass effect from progressive malignant ascites, ? omental and pelvic peritoneal metastatic disease, ? Cervical mass or metastatic disease. Patient had 3 L paracentesis done, felt better and was discharged home.   Assessment & Plan:   Principal Problem:   Refractory nausea and vomiting - appears to be related to progressive malignancy, gastric cancer, ascites - will continue to provide supportive care with IVF, analgesia and antiemetics as needed - will consult surgery to check feeding tube  Active Problems:   Gastric cancer ([place]) - d/w Dr. Burr Medico - will hold of on hospice referral at this time     Protein-calorie malnutrition, severe - nutritionist consulted     Anemia in neoplastic disease - monitor Hg, no indication for transfusion at this time   DVT prophylaxis: Lovenox SQ Code Status: Full  Family Communication: Patient at bedside  Disposition Plan: to be determined   Consultants:   Oncology  Surgery   Procedures:   None  Antimicrobials:   None   Subjective: Pt reports feeling better.   Objective: Vitals:   08/08/17 0600 08/08/17 0700 08/08/17 0716 08/08/17 0826  BP: (!) 146/106 (!) 144/100 (!) 144/100 (!) 131/106  Pulse: 92 93 88 98  Resp: 20 (!) 27 19 18   Temp:    98.5 F (36.9 C)  TempSrc:    Oral  SpO2: 99% 100% 100% 100%  Weight:    47.3 kg (104 lb 4.4 oz)    Height:        Intake/Output Summary (Last 24 hours) at 08/08/17 1812 Last data filed at 08/08/17 1221  Gross per 24 hour  Intake                0 ml  Output                1 ml  Net               -1 ml   Filed Weights   08/07/17 1727 08/08/17 0826  Weight: 49.9 kg (110 lb) 47.3 kg (104 lb 4.4 oz)    Examination:  General exam: Appears calm, NAD Respiratory system: Clear to auscultation. Respiratory effort normal. Cardiovascular system: S1 & S2 heard, RRR. No JVD, rubs, gallops or clicks. No pedal edema. Gastrointestinal system: Abdomen is distended, non tender, tube site appears clean with some drainage noted on the gauze but no clear leakage noted  Central nervous system: Alert and oriented. No focal neurological deficits. Extremities: Symmetric 5 x 5 power.  Data Reviewed: I have personally reviewed following labs and imaging studies  CBC:  Recent Labs Lab 08/06/17 1613 08/07/17 1928 08/08/17 0630  WBC 7.3 8.5 4.8  NEUTROABS 4.8 7.0  --   HGB 12.6 11.0* 10.4*  HCT 38.7 34.2* 32.6*  MCV 86.8 87.0 85.3  PLT 485* 428* 106   Basic Metabolic Panel:  Recent Labs Lab 08/06/17 1613 08/07/17 1928 08/08/17 0630  NA 138 141 143  K 4.6 4.0 4.5  CL 100* 107 107  CO2 28 26 26   GLUCOSE 131* 136* 117*  BUN 13 10 12   CREATININE 0.57 0.42* 0.52  CALCIUM 8.8* 7.9* 8.4*  MG 2.2  --   --    Liver Function Tests:  Recent Labs Lab 08/06/17 1613 08/07/17 1928  AST 48* 31  ALT 33 25  ALKPHOS 78 62  BILITOT 0.2* 0.4  PROT 7.1 6.0*  ALBUMIN 2.6* 2.2*    Recent Labs Lab 08/06/17 1613 08/07/17 1928  LIPASE 24 211*    Recent Labs Lab 08/07/17 1928  AMMONIA 15   Coagulation Profile:  Recent Labs Lab 08/07/17 1928  INR 1.05   Urine analysis:    Component Value Date/Time   COLORURINE AMBER (A) 08/08/2017 0230   APPEARANCEUR HAZY (A) 08/08/2017 0230   LABSPEC 1.028 08/08/2017 0230   PHURINE 5.0 08/08/2017 0230   GLUCOSEU NEGATIVE 08/08/2017 0230    HGBUR NEGATIVE 08/08/2017 0230   BILIRUBINUR NEGATIVE 08/08/2017 0230   KETONESUR 20 (A) 08/08/2017 0230   PROTEINUR NEGATIVE 08/08/2017 0230   NITRITE NEGATIVE 08/08/2017 0230   LEUKOCYTESUR NEGATIVE 08/08/2017 0230   Recent Results (from the past 240 hour(s))  Culture, blood (Routine X 2) w Reflex to ID Panel     Status: None   Collection Time: 07/31/17 10:04 PM  Result Value Ref Range Status   Specimen Description BLOOD LEFT PORTA CATH  Final   Special Requests   Final    BOTTLES DRAWN AEROBIC AND ANAEROBIC Blood Culture adequate volume   Culture   Final    NO GROWTH 5 DAYS Performed at [facility], [place] [address] [place], [place] [address]    Report Status 08/06/2017 FINAL  Final  Culture, blood (Routine X 2) w Reflex to ID Panel     Status: None   Collection Time: 07/31/17 10:07 PM  Result Value Ref Range Status   Specimen Description BLOOD RIGHT ANTECUBITAL  Final   Special Requests   Final    BOTTLES DRAWN AEROBIC AND ANAEROBIC Blood Culture adequate volume   Culture   Final    NO GROWTH 5 DAYS Performed at [facility], [place] [address] [place], [place] [address]    Report Status 08/06/2017 FINAL  Final  Body fluid culture     Status: None   Collection Time: 08/01/17  2:45 PM  Result Value Ref Range Status   Specimen Description PERITONEAL  Final   Special Requests NONE  Final   Gram Stain   Final    MODERATE WBC PRESENT,BOTH PMN AND MONONUCLEAR NO ORGANISMS SEEN    Culture   Final    NO GROWTH 3 DAYS Performed at [facility], [place] [address] [place], [place] [address]    Report Status 08/05/2017 FINAL  Final  Culture, body fluid-bottle     Status: None (Preliminary result)   Collection Time: 08/06/17  8:23 PM  Result Value Ref Range Status   Specimen Description FLUID PERITONEAL  Final   Special Requests   Final    BOTTLES DRAWN AEROBIC ONLY Blood Culture results may not be optimal due to an excessive volume of blood received in culture  bottles   Culture   Final    NO GROWTH 1 DAY Performed at [facility], [place] [address] [place], [place] [address]    Report Status  PENDING  Incomplete  Gram stain     Status: None   Collection Time: 08/06/17  8:23 PM  Result Value Ref Range Status   Specimen Description FLUID PERITONEAL  Final   Special Requests NONE  Final   Gram Stain   Final    FEW WBC PRESENT,BOTH PMN AND MONONUCLEAR NO ORGANISMS SEEN RESULT CALLED TO, READ BACK BY AND VERIFIED WITH: B.JESSEE,RN 0145 08/07/17 M.CAMPBELL Performed at [facility], [place] [address] [place], [place] [address]    Report Status 08/08/2017 FINAL  Final  Gram stain     Status: None   Collection Time: 08/06/17 11:36 PM  Result Value Ref Range Status   Specimen Description PERITONEAL CAVITY  Final   Special Requests Normal  Final   Gram Stain   Final    CYTOSPIN SMEAR WBC PRESENT,BOTH PMN AND MONONUCLEAR NO ORGANISMS SEEN RESULT CALLED TO, READ BACK BY AND VERIFIED WITH: A MARQUEZ,RN @0059  08/07/17 MKELLY    Report Status 08/07/2017 FINAL  Final    Radiology Studies: Ct Abdomen Pelvis W Contrast  Result Date: 08/06/2017 CLINICAL DATA:  Nausea and vomiting. Abdominal distention. Undergoing chemotherapy for gastric cancer. EXAM: CT ABDOMEN AND PELVIS WITH CONTRAST TECHNIQUE: Multidetector CT imaging of the abdomen and pelvis was performed using the standard protocol following bolus administration of intravenous contrast. CONTRAST:  80 cc Isovue-300 COMPARISON:  08/01/2017 FINDINGS: Lower chest: Clear lung bases. Hepatobiliary: Progressive flattening of the lateral margins of the liver by the adjacent ascites. Contracted gallbladder. Pancreas: Flattened pancreatic margins by the ascites. Spleen: Flattened margins by the ascites. Adrenals/Urinary Tract: 2 small left renal calculi. Interval mild to moderate dilatation of the right renal collecting system and proximal ureter with delayed excretion of contrast compared to the left.  No obstructing calculus seen. Minimal urine in the bladder. The bladder is compressed by the large amount of ascites. Normal appearing adrenal glands. Stomach/Bowel: Diffuse low-density gastric wall thickening without significant change. This remains most pronounced involving the antrum. Multiple dilated loops of proximal jejunum with mild progression. A percutaneous jejunal catheter remains in place. Normal caliber colon and distal small bowel. Vascular/Lymphatic: Mildly prominent retroperitoneal lymph nodes without significant change. No arterial calcifications are aneurysm. Reproductive: Heterogeneous mass-like enlargement of the cervix, measuring 7.1 x 4.3 cm on image number 83 of series 2. The uterus is also mildly heterogeneous and located in the pelvis on the left. There is more ill-defined, heterogeneous, soft tissue density in the posterior aspect of the pelvic ascites with one area measuring 6.5 x 3.7 cm on image number 73 of series 2 and another measuring 2.9 x 1.2 cm on image number 74 of series 2. The latter area may represent the right ovary. The left ovary is grossly unremarkable. Other: Large amount of abdominal and pelvic ascites, mildly increased. There is associated centralization of bowel loops. There is also omental heterogeneous soft tissue density currently. Anteriorly on the right, this measures 9.1 x 3.2 cm on image number 44 of series 2 on the left, this measures 3.1 x 1.6 cm on image number 42 of series 2. Musculoskeletal: Mild lumbar spine degenerative changes. IMPRESSION: 1. Interval mild to moderate right hydronephrosis and proximal right hydroureter. This is concerning for ureteral obstruction by mass effect produced by the patient's large amount of ascites. 2. Mildly progressive malignant ascites in the abdomen pelvis with mass effect, as described above. 3. 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 4. Possible cervical mass or metastatic disease obscuring the cervix.  5. Stable low-density gastric wall thickening compatible with the patient's known gastric malignancy. 6. Two small, nonobstructing left renal calculi. 7. Borderline enlarged retroperitoneal lymph nodes. Electronically Signed   By: Claudie Revering M.D.   On: 08/06/2017 19:56   Dg Abdomen Acute W/chest  Result Date: 08/07/2017 CLINICAL DATA:  Abdominal pain, nausea and vomiting. Malignant ascites. Lethargy. Currently being treated for gastric cancer. EXAM: DG ABDOMEN ACUTE W/ 1V CHEST COMPARISON:  Abdomen pelvis CT obtained yesterday. Chest, abdomen pelvis CT dated 08/01/2017. FINDINGS: Normal sized heart. Clear lungs. Left subclavian porta catheter tip in the inferior aspect of the superior vena cava. Again demonstrated are mildly dilated loops of jejunum with a jejunal catheter in place. These contain air-fluid levels. No free peritoneal air. There is less central is a shin of the bowel loops following large volume paracentesis yesterday. Unremarkable bones. IMPRESSION: 1. No significant change in mild jejunal ileus or partial obstruction. 2. Decreased amount of ascites following paracentesis yesterday. 3. No acute chest abnormality. Electronically Signed   By: Claudie Revering M.D.   On: 08/07/2017 19:02   Scheduled Meds: . enoxaparin (LOVENOX) injection  40 mg Subcutaneous QHS  . lidocaine-prilocaine   Topical Once   Continuous Infusions: . lactated ringers 75 mL/hr at 08/08/17 1328     LOS: 0 days   Time spent: 25 minutes   Faye Ramsay, MD [facility] Pager [phone]  If 7PM-7AM, please contact night-coverage [email or web address] Password [email or web address] 08/08/2017, 6:12 PM

## 2017-08-08 NOTE — Progress Notes (Signed)
Claire Bray   DOB:1976/05/09   VI#:[id number]   MDY#:[id number]  Hematology and oncology follow up   Subjective: Pt was admitted yesterday for persistent nausea, vomiting and abdominal pain. She was seen by ED and underwent paracentesis on 08/06/2017, discharged home, and returned to ED again yesterday.   Objective:  Vitals:   08/08/17 0826 08/08/17 2041  BP: (!) 131/106 (!) 139/94  Pulse: 98 100  Resp: 18 20  Temp: 98.5 F (36.9 C) 98.3 F (36.8 C)  SpO2: 100% 100%    Body mass index is 17.49 kg/m.  Intake/Output Summary (Last 24 hours) at 08/08/17 2156 Last data filed at 08/08/17 2048  Gross per 24 hour  Intake              360 ml  Output                1 ml  Net              359 ml     Sclerae unicteric  Oropharynx clear  No peripheral adenopathy  Lungs clear -- no rales or rhonchi  Heart regular rate and rhythm  Abdomen soft, mild diffuse tenderness, (+) PEG tube on left, covering gauze soaked with tube feeds   MSK no focal spinal tenderness, no peripheral edema  Neuro nonfocal   CBG (last 3)  No results for input(s): GLUCAP in the last 72 hours.   Labs:  Urine Studies No results for input(s): UHGB, CRYS in the last 72 hours.  Invalid input(s): UACOL, UAPR, USPG, UPH, UTP, UGL, UKET, UBIL, UNIT, UROB, ULEU, UEPI, UWBC, URBC, UBAC, CAST, UCOM, [place]  Basic Metabolic Panel:  Recent Labs Lab 08/06/17 1613 08/07/17 1928 08/08/17 0630  NA 138 141 143  K 4.6 4.0 4.5  CL 100* 107 107  CO2 _0 GLUCOSE 131* 136* 117*  BUN _1 CREATININE 0.57 0.42* 0.52  CALCIUM 8.8* 7.9* 8.4*  MG 2.2  --   --    GFR Estimated Creatinine Clearance: 68.4 mL/min (by C-G formula based on SCr of 0.52 mg/dL). Liver Function Tests:  Recent Labs Lab 08/06/17 1613 08/07/17 1928  AST 48* 31  ALT 33 25  ALKPHOS 78 62  BILITOT 0.2* 0.4  PROT 7.1 6.0*  ALBUMIN 2.6* 2.2*    Recent Labs Lab 08/06/17 1613 08/07/17 1928  LIPASE 24 211*    Recent Labs Lab  08/07/17 1928  AMMONIA 15   Coagulation profile  Recent Labs Lab 08/07/17 1928  INR 1.05    CBC:  Recent Labs Lab 08/06/17 1613 08/07/17 1928 08/08/17 0630  WBC 7.3 8.5 4.8  NEUTROABS 4.8 7.0  --   HGB 12.6 11.0* 10.4*  HCT 38.7 34.2* 32.6*  MCV 86.8 87.0 85.3  PLT 485* 428* 388   Cardiac Enzymes: No results for input(s): CKTOTAL, CKMB, CKMBINDEX, TROPONINI in the last 168 hours. BNP: Invalid input(s): POCBNP CBG: No results for input(s): GLUCAP in the last 168 hours. D-Dimer No results for input(s): DDIMER in the last 72 hours. Hgb A1c No results for input(s): HGBA1C in the last 72 hours. Lipid Profile No results for input(s): CHOL, HDL, LDLCALC, TRIG, CHOLHDL, LDLDIRECT in the last 72 hours. Thyroid function studies No results for input(s): TSH, T4TOTAL, T3FREE, THYROIDAB in the last 72 hours.  Invalid input(s): FREET3 Anemia work up No results for input(s): VITAMINB12, FOLATE, FERRITIN, TIBC, IRON, RETICCTPCT in the last 72 hours. Microbiology Recent Results (from the past 240  hour(s))  Culture, blood (Routine X 2) w Reflex to ID Panel     Status: None   Collection Time: 07/31/17 10:04 PM  Result Value Ref Range Status   Specimen Description BLOOD LEFT PORTA CATH  Final   Special Requests   Final    BOTTLES DRAWN AEROBIC AND ANAEROBIC Blood Culture adequate volume   Culture   Final    NO GROWTH 5 DAYS Performed at [facility], [address], [place], [place] [address]    Report Status 08/06/2017 FINAL  Final  Culture, blood (Routine X 2) w Reflex to ID Panel     Status: None   Collection Time: 07/31/17 10:07 PM  Result Value Ref Range Status   Specimen Description BLOOD RIGHT ANTECUBITAL  Final   Special Requests   Final    BOTTLES DRAWN AEROBIC AND ANAEROBIC Blood Culture adequate volume   Culture   Final    NO GROWTH 5 DAYS Performed at [facility], [place] [address] [place], [place] [address]    Report Status 08/06/2017 FINAL   Final  Body fluid culture     Status: None   Collection Time: 08/01/17  2:45 PM  Result Value Ref Range Status   Specimen Description PERITONEAL  Final   Special Requests NONE  Final   Gram Stain   Final    MODERATE WBC PRESENT,BOTH PMN AND MONONUCLEAR NO ORGANISMS SEEN    Culture   Final    NO GROWTH 3 DAYS Performed at [facility], [place] [address] [place], [place] [address]    Report Status 08/05/2017 FINAL  Final  Culture, body fluid-bottle     Status: None (Preliminary result)   Collection Time: 08/06/17  8:23 PM  Result Value Ref Range Status   Specimen Description FLUID PERITONEAL  Final   Special Requests   Final    BOTTLES DRAWN AEROBIC ONLY Blood Culture results may not be optimal due to an excessive volume of blood received in culture bottles   Culture   Final    NO GROWTH 1 DAY Performed at [facility], [place] [address] [place], [place] [address]    Report Status PENDING  Incomplete  Gram stain     Status: None   Collection Time: 08/06/17  8:23 PM  Result Value Ref Range Status   Specimen Description FLUID PERITONEAL  Final   Special Requests NONE  Final   Gram Stain   Final    FEW WBC PRESENT,BOTH PMN AND MONONUCLEAR NO ORGANISMS SEEN RESULT CALLED TO, READ BACK BY AND VERIFIED WITH: B.JESSEE,RN 0145 08/07/17 M.CAMPBELL Performed at [facility], [place] [address], [place], [place] [address]    Report Status 08/08/2017 FINAL  Final  Gram stain     Status: None   Collection Time: 08/06/17 11:36 PM  Result Value Ref Range Status   Specimen Description PERITONEAL CAVITY  Final   Special Requests Normal  Final   Gram Stain   Final    CYTOSPIN SMEAR WBC PRESENT,BOTH PMN AND MONONUCLEAR NO ORGANISMS SEEN RESULT CALLED TO, READ BACK BY AND VERIFIED WITH: A MARQUEZ,RN _0  08/07/17 MKELLY    Report Status 08/07/2017 FINAL  Final      Studies:  Dg Abdomen Acute W/chest  Result Date: 08/07/2017 CLINICAL DATA:  Abdominal pain, nausea and  vomiting. Malignant ascites. Lethargy. Currently being treated for gastric cancer. EXAM: DG ABDOMEN ACUTE W/ 1V CHEST COMPARISON:  Abdomen pelvis CT obtained  yesterday. Chest, abdomen pelvis CT dated 08/01/2017. FINDINGS: Normal sized heart. Clear lungs. Left subclavian porta catheter tip in the inferior aspect of the superior vena cava. Again demonstrated are mildly dilated loops of jejunum with a jejunal catheter in place. These contain air-fluid levels. No free peritoneal air. There is less central is a shin of the bowel loops following large volume paracentesis yesterday. Unremarkable bones. IMPRESSION: 1. No significant change in mild jejunal ileus or partial obstruction. 2. Decreased amount of ascites following paracentesis yesterday. 3. No acute chest abnormality. Electronically Signed   By: Claudie Revering M.D.   On: 08/07/2017 19:02    Assessment: 41 y.o. 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 Refractory nausea and vomiting, abdominal pain, secondary to #2 2. Metastatic gastric cancer to peritoneum, with malignant ascites  3. Anemia of iron deficiency and anemia in neoplastic disease  4. Hydronephrosis secondary to ascites  5. Severe protein and calorie malnutrition  6. Code status: full    Plan:  -I reviewed her recently cytology from paracentesis and CT scan findings, she clearly has had disease progression with new malignant ascites and worsening peritoneal carcinomatosis. I have stopped her first line chemo FOLFOX last week -I discussed options of second line therapy. Her tumor has PD-L1 expression, she is a candidate for immunotherapy such as Keytruda. Given her rapid progression, I recommend her to try Taxol+keytruda for better and quick disease control. Potential side effects discussed with her, she agrees to proceed, will schedule first treatment within a week at our cancer center.  -we discussed palliative care and  hospice also. Given her young age, previous good PS, and limited treatment only, she would like to try more cancer treatment, which I recommend -code status addressed, I recommend DNR, pt wishes to be full code for now -please consult IR for a pleurx placement for recurrent and symptomatic ascites  -consult surgery for her feeding tube leakage  -I spoke with Dr. Doyle Askew today -continue supportive care for pain and nausea. I recommend her to limit her po intake to clear liquid for now -I will follow up    Truitt Merle, MD 08/08/2017

## 2017-08-09 ENCOUNTER — Ambulatory Visit: Payer: Self-pay | Admitting: Hematology

## 2017-08-09 ENCOUNTER — Ambulatory Visit: Payer: Medicaid Other

## 2017-08-09 ENCOUNTER — Telehealth: Payer: Self-pay

## 2017-08-09 ENCOUNTER — Encounter: Payer: Medicaid Other | Admitting: Nutrition

## 2017-08-09 ENCOUNTER — Other Ambulatory Visit: Payer: Self-pay

## 2017-08-09 ENCOUNTER — Inpatient Hospital Stay (HOSPITAL_COMMUNITY): Payer: Medicaid Other

## 2017-08-09 LAB — CBC
HCT: 32.6 % — ABNORMAL LOW (ref 36.0–46.0)
Hemoglobin: 10.4 g/dL — ABNORMAL LOW (ref 12.0–15.0)
MCH: 28 pg (ref 26.0–34.0)
MCHC: 31.9 g/dL (ref 30.0–36.0)
MCV: 87.9 fL (ref 78.0–100.0)
PLATELETS: 413 10*3/uL — AB (ref 150–400)
RBC: 3.71 MIL/uL — AB (ref 3.87–5.11)
RDW: 15.7 % — ABNORMAL HIGH (ref 11.5–15.5)
WBC: 5.3 10*3/uL (ref 4.0–10.5)

## 2017-08-09 LAB — COMPREHENSIVE METABOLIC PANEL
ALBUMIN: 2.2 g/dL — AB (ref 3.5–5.0)
ALT: 19 U/L (ref 14–54)
AST: 23 U/L (ref 15–41)
Alkaline Phosphatase: 59 U/L (ref 38–126)
Anion gap: 9 (ref 5–15)
BUN: 13 mg/dL (ref 6–20)
CHLORIDE: 106 mmol/L (ref 101–111)
CO2: 27 mmol/L (ref 22–32)
CREATININE: 0.52 mg/dL (ref 0.44–1.00)
Calcium: 8.3 mg/dL — ABNORMAL LOW (ref 8.9–10.3)
GFR calc Af Amer: >60 mL/min (ref >60)
GFR calc non Af Amer: >60 mL/min (ref >60)
GLUCOSE: 94 mg/dL (ref 65–99)
Potassium: 4.1 mmol/L (ref 3.5–5.1)
SODIUM: 142 mmol/L (ref 135–145)
Total Bilirubin: 0.6 mg/dL (ref 0.3–1.2)
Total Protein: 5.9 g/dL — ABNORMAL LOW (ref 6.5–8.1)

## 2017-08-09 LAB — LIPASE, BLOOD: Lipase: 18 U/L (ref 11–51)

## 2017-08-09 MED ORDER — IOPAMIDOL (ISOVUE-300) INJECTION 61%
INTRAVENOUS | Status: AC
  Administered 2017-08-09: 50 mL via JEJUNOSTOMY
  Filled 2017-08-09: qty 50

## 2017-08-09 NOTE — Progress Notes (Addendum)
Patient ID: Claire Bray, female   DOB: February 03, 1976, 41 y.o.   MRN: [id number]    PROGRESS NOTE  Claire Bray  PIR:[id number] DOB: [birth year] DOA: 08/07/2017  PCP: Patient, No Pcp Per   Brief Narrative:  41 y.o. 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 Per record review, pt has completed three cycles of chemo with FOLFOX. Pt was just seen in ED three days PTA for same concern, at that time she had Ct abd done which was notable for hydronephrosis due to mass effect from progressive malignant ascites, ? omental and pelvic peritoneal metastatic disease, ? Cervical mass or metastatic disease. Patient had 3 L paracentesis done, felt better and was discharged home.   Assessment & Plan:   Principal Problem:   Refractory nausea and vomiting, recurring ascites  - appears to be related to progressive malignancy, gastric cancer, ascites - will continue to provide supportive care with IVF, analgesia and antiemetics as needed - will consult surgery to check feeding tube for leakage  - on clear liquid diet and so far tolerating well but had one episode of vomiting   Active Problems:   Gastric cancer ([place]), metastatic to peritoneum with malignant ascites  - d/w Dr. Burr Medico - IR consult for pleurx placement for recurrent and symptomatic ascites     Hydronephrosis secondary to ascites - IR consulted as noted above     Protein-calorie malnutrition, severe - nutritionist consulted     Anemia in neoplastic disease - monitor Hg, no evidence of active bleeding - CBC in AM  DVT prophylaxis: Lovenox SQ Code Status: Full  Family Communication: Patient at bedside  Disposition Plan: to be determined   Consultants:   Oncology  Surgery   IR  Procedures:   None  Antimicrobials:   None   Subjective: Pt reports feeling bit better this AM.  Objective: Vitals:    08/08/17 0716 08/08/17 0826 08/08/17 2041 08/09/17 0534  BP: (!) 144/100 (!) 131/106 (!) 139/94 (!) 154/100  Pulse: 88 98 100 (!) 101  Resp: 19 18 20 17   Temp:  98.5 F (36.9 C) 98.3 F (36.8 C) 98.1 F (36.7 C)  TempSrc:  Oral Oral Oral  SpO2: 100% 100% 100% 100%  Weight:  47.3 kg (104 lb 4.4 oz)    Height:        Intake/Output Summary (Last 24 hours) at 08/09/17 1151 Last data filed at 08/09/17 0400  Gross per 24 hour  Intake          1031.25 ml  Output                1 ml  Net          1030.25 ml   Filed Weights   08/07/17 1727 08/08/17 0826  Weight: 49.9 kg (110 lb) 47.3 kg (104 lb 4.4 oz)    Physical Exam  Constitutional: Appears calm, NAD CVS: RRR, S1/S2 +, no murmurs, no gallops, no carotid bruit.  Pulmonary: Effort and breath sounds normal, no stridor, rhonchi, wheezes, rales.  Abdominal: diffuse tenderness and distension, PEG tube on the left side  Musculoskeletal: Normal range of motion. No edema and no tenderness.  Neuro: Alert. Normal reflexes, muscle tone coordination. No cranial nerve deficit.  Data Reviewed: I have personally reviewed following labs and imaging studies  CBC:  Recent Labs Lab 08/06/17 1613 08/07/17 1928 08/08/17 0630 08/09/17 0510  WBC 7.3 8.5 4.8 5.3  NEUTROABS 4.8 7.0  --   --   HGB 12.6 11.0* 10.4* 10.4*  HCT 38.7 34.2* 32.6* 32.6*  MCV 86.8 87.0 85.3 87.9  PLT 485* 428* 388 062*   Basic Metabolic Panel:  Recent Labs Lab 08/06/17 1613 08/07/17 1928 08/08/17 0630 08/09/17 0510  NA 138 141 143 142  K 4.6 4.0 4.5 4.1  CL 100* 107 107 106  CO2 28 26 26 27   GLUCOSE 131* 136* 117* 94  BUN 13 10 12 13   CREATININE 0.57 0.42* 0.52 0.52  CALCIUM 8.8* 7.9* 8.4* 8.3*  MG 2.2  --   --   --    Liver Function Tests:  Recent Labs Lab 08/06/17 1613 08/07/17 1928 08/09/17 0510  AST 48* 31 23  ALT 33 25 19  ALKPHOS 78 62 59  BILITOT 0.2* 0.4 0.6  PROT 7.1 6.0* 5.9*  ALBUMIN 2.6* 2.2* 2.2*    Recent Labs Lab  08/06/17 1613 08/07/17 1928 08/09/17 0510  LIPASE 24 211* 18    Recent Labs Lab 08/07/17 1928  AMMONIA 15   Coagulation Profile:  Recent Labs Lab 08/07/17 1928  INR 1.05   Urine analysis:    Component Value Date/Time   COLORURINE AMBER (A) 08/08/2017 0230   APPEARANCEUR HAZY (A) 08/08/2017 0230   LABSPEC 1.028 08/08/2017 0230   PHURINE 5.0 08/08/2017 0230   GLUCOSEU NEGATIVE 08/08/2017 0230   HGBUR NEGATIVE 08/08/2017 0230   BILIRUBINUR NEGATIVE 08/08/2017 0230   KETONESUR 20 (A) 08/08/2017 0230   PROTEINUR NEGATIVE 08/08/2017 0230   NITRITE NEGATIVE 08/08/2017 0230   LEUKOCYTESUR NEGATIVE 08/08/2017 0230   Recent Results (from the past 240 hour(s))  Culture, blood (Routine X 2) w Reflex to ID Panel     Status: None   Collection Time: 07/31/17 10:04 PM  Result Value Ref Range Status   Specimen Description BLOOD LEFT PORTA CATH  Final   Special Requests   Final    BOTTLES DRAWN AEROBIC AND ANAEROBIC Blood Culture adequate volume   Culture   Final    NO GROWTH 5 DAYS Performed at [facility], [place] [address] [place], [place] [address]    Report Status 08/06/2017 FINAL  Final  Culture, blood (Routine X 2) w Reflex to ID Panel     Status: None   Collection Time: 07/31/17 10:07 PM  Result Value Ref Range Status   Specimen Description BLOOD RIGHT ANTECUBITAL  Final   Special Requests   Final    BOTTLES DRAWN AEROBIC AND ANAEROBIC Blood Culture adequate volume   Culture   Final    NO GROWTH 5 DAYS Performed at [facility], [place] [address] [place], [place] [address]    Report Status 08/06/2017 FINAL  Final  Body fluid culture     Status: None   Collection Time: 08/01/17  2:45 PM  Result Value Ref Range Status   Specimen Description PERITONEAL  Final   Special Requests NONE  Final   Gram Stain   Final    MODERATE WBC PRESENT,BOTH PMN AND MONONUCLEAR NO ORGANISMS SEEN    Culture   Final    NO GROWTH 3 DAYS Performed at [facility],  [place] [address] [place], [place] [address]    Report Status 08/05/2017 FINAL  Final  Culture, body fluid-bottle     Status: None (Preliminary result)   Collection Time: 08/06/17  8:23 PM  Result Value Ref Range Status  Specimen Description FLUID PERITONEAL  Final   Special Requests   Final    BOTTLES DRAWN AEROBIC ONLY Blood Culture results may not be optimal due to an excessive volume of blood received in culture bottles   Culture   Final    NO GROWTH 1 DAY Performed at [facility] [address], [place], [place] [address]    Report Status PENDING  Incomplete  Gram stain     Status: None   Collection Time: 08/06/17  8:23 PM  Result Value Ref Range Status   Specimen Description FLUID PERITONEAL  Final   Special Requests NONE  Final   Gram Stain   Final    FEW WBC PRESENT,BOTH PMN AND MONONUCLEAR NO ORGANISMS SEEN RESULT CALLED TO, READ BACK BY AND VERIFIED WITH: B.JESSEE,RN 0145 08/07/17 M.CAMPBELL Performed at [facility], [place] [address] [place],  [address]    Report Status 08/08/2017 FINAL  Final  Gram stain     Status: None   Collection Time: 08/06/17 11:36 PM  Result Value Ref Range Status   Specimen Description PERITONEAL CAVITY  Final   Special Requests Normal  Final   Gram Stain   Final    CYTOSPIN SMEAR WBC PRESENT,BOTH PMN AND MONONUCLEAR NO ORGANISMS SEEN RESULT CALLED TO, READ BACK BY AND VERIFIED WITH: A MARQUEZ,RN @0059  08/07/17 MKELLY    Report Status 08/07/2017 FINAL  Final    Radiology Studies: Dg Abdomen Acute W/chest  Result Date: 08/07/2017 CLINICAL DATA:  Abdominal pain, nausea and vomiting. Malignant ascites. Lethargy. Currently being treated for gastric cancer. EXAM: DG ABDOMEN ACUTE W/ 1V CHEST COMPARISON:  Abdomen pelvis CT obtained yesterday. Chest, abdomen pelvis CT dated 08/01/2017. FINDINGS: Normal sized heart. Clear lungs. Left subclavian porta catheter tip in the inferior aspect of the superior vena cava. Again demonstrated  are mildly dilated loops of jejunum with a jejunal catheter in place. These contain air-fluid levels. No free peritoneal air. There is less central is a shin of the bowel loops following large volume paracentesis yesterday. Unremarkable bones. IMPRESSION: 1. No significant change in mild jejunal ileus or partial obstruction. 2. Decreased amount of ascites following paracentesis yesterday. 3. No acute chest abnormality. Electronically Signed   By: Claudie Revering M.D.   On: 08/07/2017 19:02   Scheduled Meds: . enoxaparin (LOVENOX) injection  40 mg Subcutaneous QHS  . lidocaine-prilocaine   Topical Once   Continuous Infusions: . sodium chloride 75 mL/hr at 08/08/17 1903     LOS: 1 day   Time spent: 25 minutes   Faye Ramsay, MD [facility] Pager [phone]  If 7PM-7AM, please contact night-coverage [email or web address] Password [email or web address] 08/09/2017, 11:51 AM

## 2017-08-09 NOTE — Progress Notes (Signed)
Initial Nutrition Assessment  DOCUMENTATION CODES:   Severe malnutrition in context of chronic illness, Underweight  INTERVENTION:   When able to resume feeds: Recommend Osmolite 1.2 @ 20 ml/hr, advance by 10 ml every 4 hours to goal rate of 65 ml/hr.  RD will continue to monitor for plan  NUTRITION DIAGNOSIS:   Malnutrition(severe) related to chronic illness, cancer and cancer related treatments as evidenced by severe depletion of body fat, severe depletion of muscle mass.  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 Per record review, pt has completed three cycles of chemo with FOLFOX. Pt was just seen in ED three days PTA for same concern, at that time she had Ct abd done which was notable for hydronephrosis due to mass effect from progressive malignant ascites, ? omental and pelvic peritoneal metastatic disease, ? Cervical mass or metastatic disease. Patient had 3 L paracentesis done, felt better and was discharged home.   Patient followed by [facility], last seen 8/29. Pt admitted with N/V for 1 day PTA. Pt continues to have some nausea. On clear liquid diet. Pt has J-tube and receives Osmolite 1.2 @ 66 ml/hr x 20 hours at home to allow time for ADLs (provided 1584 kcal, 73g protein).  Free water flushes recommended at 60 ml before and after infusions.  MD to have IR assess tube for leaks. If tube is able to to be used, will resume Osmolite 1.2 @ 20 ml/hr given recent N/V and will advance every 4 hours to ensure tolerance. Pt's goal over 24 hours will be 65 ml/hr. This will provide 1872 kcal (110% of needs), 86g  protein (107% of needs) and 1279 ml H2O.   Per chart review, pt's weight is down 6 lb since 9/29 but overall is up since August 2018. Nutrition-Focused physical exam completed. Findings are severe fat depletion, severe muscle depletion, and no edema.   Labs reviewed. Medications: IV Zofran PRN  Diet Order:  Diet clear liquid Room service appropriate? Yes; Fluid consistency: Thin  Skin:  Reviewed, no issues  Last BM:  PTA  Height:   Ht Readings from Last 1 Encounters:  08/07/17 5' 4.75" (1.645 m)    Weight:   Wt Readings from Last 1 Encounters:  08/08/17 104 lb 4.4 oz (47.3 kg)    Ideal Body Weight:  56.8 kg  BMI:  Body mass index is 17.49 kg/m.  Estimated Nutritional Needs:   Kcal:  1500-1700  Protein:  70-80g  Fluid:  1.7L/day  EDUCATION NEEDS:   Education needs no appropriate at this time  Clayton Bibles, MS, RD, LDN Pager: [phone] After Hours Pager: [phone]

## 2017-08-09 NOTE — Consult Note (Signed)
Consultation Note Date: 08/09/2017   Patient Name: Claire Bray  DOB: 12-09-1975  MRN: [id number]  Age / Sex: 41 y.o., female  PCP: Patient, No Pcp Per Referring Physician: Theodis Blaze, MD  Reason for Consultation: Establishing goals of care and Psychosocial/spiritual support  HPI/Patient Profile: 41 y.o. 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  Patient is non-English speaking.  She was last seen by oncology on 9/24.  She has completed 3 cycles of palliative chemotherapy with FOLFOX and appears to have progressive disease.  She has a feeding tube.  Multiple emergency room visits over the last 2 months.    Was seen in the emergency room one day prior to this admission for persistent nausea and vomiting. She had a CT which showed hydronephrosis thought to be resulting from mass effect from her ascites; progressive malignant ascites; probable omental and pelvic peritoneal metastatic disease; and possible cervical mass or metastatic disease obscuring the cervix.   She had a 3L paracentesis with improvement in her symptoms and cessation of vomiting.    She reports leaking around insertion site of PEG tube.  Patient and family face treatment option decisions, advanced directive decisions, and anticipatory care needs.    Clinical Assessment and Goals of Care:   This NP Wadie Lessen reviewed medical records, received report from team, assessed the patient and then meet at the patient's bedside along with her husband and sister  to discuss diagnosis, prognosis, GOC, EOL wishes disposition and options.  A mobile interpreter was utilized.  Concept of Palliative Care was discussed.   A detailed discussion was had today attempting to clarify the complexity of her disease,  viable treatment options, limitations of medical interventions and ultimately the patients goals of care for herself.  Values and goals of care important to patient and family were attempted to be elicited.  Patient and family clearly verbalized that they are open to all offered and available medical interventions to prolong life. - Await interventional radiology's input for tunneled catheter for ascites -Await surgical input related to PEG tube leakage -Consider nutritional consult for initiation of TPN   Questions and concerns addressed.   Family encouraged to call with questions or concerns.  PMT will continue to support holistically.  Palliative medicine will continue to support holistically    SUMMARY OF RECOMMENDATIONS    Code Status/Advance Care Planning:  Full code- CODE STATUS was discussed today with Dr. Burr Medico and at this point in time patient remains a full code   Symptom Management:   Nausea: Zofran 4 mg IV every 6 hours when necessary/ Phenergan 25 mg IV every 4 hours when necessary       Consider utilization of PEG tube for venting purposes if decision is for TPN for nutritional support. *     Pain:  Morphine 2-4 mg IV every 2 hours when necessary  Palliative Prophylaxis:   Aspiration, Bowel Regimen, Delirium Protocol,  Frequent Pain Assessment and Oral Care  Additional Recommendations (Limitations, Scope, Preferences):  Full Scope Treatment  Psycho-social/Spiritual:   Desire for further Chaplaincy support:no- strong community church support, patient is Catholic   Prognosis:   Unable to determine  Discharge Planning: To Be Determined      Primary Diagnoses: Present on Admission: . Refractory nausea and vomiting . Anemia in neoplastic disease . Gastric cancer ([place]) . Protein-calorie malnutrition, severe   I have reviewed the medical record, interviewed the patient and family, and examined the patient. The following aspects are pertinent.  Past  Medical History:  Diagnosis Date  . Cancer (HCC)    STOMACH  . No pertinent past medical history    Social History   Social History  . Marital status: Married    Spouse name: N/A  . Number of children: N/A  . Years of education: N/A   Social History Main Topics  . Smoking status: Never Smoker  . Smokeless tobacco: Never Used  . Alcohol use No  . Drug use: No  . Sexual activity: Not Currently   Other Topics Concern  . None   Social History Narrative   ** Merged History Encounter **       Family History  Problem Relation Age of Onset  . Hypertension Mother   . Heart disease Mother   . Other Neg Hx    Scheduled Meds: . enoxaparin (LOVENOX) injection  40 mg Subcutaneous QHS  . lidocaine-prilocaine   Topical Once   Continuous Infusions: . sodium chloride 75 mL/hr at 08/08/17 1903   PRN Meds:.acetaminophen **OR** acetaminophen, morphine injection, ondansetron **OR** ondansetron (ZOFRAN) IV, promethazine Medications Prior to Admission:  Prior to Admission medications   Medication Sig Start Date End Date Taking? Authorizing Provider  dicyclomine (BENTYL) 20 MG tablet Take 1 tablet (20 mg total) by mouth 2 (two) times daily. Patient not taking: Reported on 08/06/2017 04/02/17   Montine Circle, PA-C  HYDROcodone-acetaminophen (NORCO/VICODIN) 5-325 MG tablet Take 1-2 tablets by mouth every 6 (six) hours as needed for pain. 08/01/17   [provider]  ibuprofen (ADVIL,MOTRIN) 600 MG tablet Take 1 tablet (600 mg total) by mouth every 6 (six) hours. Patient not taking: Reported on 08/06/2017 12/14/12   Nolon Rod, DO  lidocaine-prilocaine (EMLA) cream Apply to portacath site 1 hour prior to use 04/27/17   Owens Shark, NP  metoCLOPramide (REGLAN) 5 MG tablet Take 2 tablets (10 mg total) by mouth 4 (four) times daily -  before meals and at bedtime. Patient not taking: Reported on 08/06/2017 04/06/17   Marcille Buffy D, CNM  Nutritional Supplements (FEEDING SUPPLEMENT,  OSMOLITE 1.2 CAL,) LIQD Place 1,000 mLs into feeding tube continuous. Patient not taking: Reported on 08/06/2017 04/24/17   Stark Klein, MD  ondansetron (ZOFRAN) 8 MG tablet Take 1 tablet (8 mg total) by mouth every 8 (eight) hours as needed for nausea or vomiting. 08/06/17   Deno Etienne, DO  pantoprazole (PROTONIX) 40 MG tablet Take 1 tablet (40 mg total) by mouth daily at 12 noon. Patient not taking: Reported on 08/06/2017 04/24/17   Stark Klein, MD  polyethylene glycol [facility]) packet Take half a pack (8.5 oz) every 12 hours. Adjust as needed for soft stools Patient not taking: Reported on 08/06/2017 04/29/17   Payton Emerald, MD  promethazine (PHENERGAN) 25 MG suppository Place 1 suppository (25 mg total) rectally every 6 (six) hours as needed for nausea. 04/17/17   Varney Biles, MD  zolpidem (AMBIEN) 5 MG tablet  Take 1 tablet (5 mg total) by mouth at bedtime as needed for sleep. Patient not taking: Reported on 08/06/2017 12/11/12   Martha Clan, MD   No Known Allergies Review of Systems  Constitutional: Positive for fatigue.  Gastrointestinal: Positive for nausea and vomiting.  Neurological: Positive for weakness.    Physical Exam  Constitutional: She appears cachectic. She appears ill.  Cardiovascular: Tachycardia present.   Pulmonary/Chest: Effort normal and breath sounds normal.  Neurological: She is alert.  Skin: Skin is warm and dry.    Vital Signs: BP (!) 154/100 (BP Location: Right Arm) Comment: RN Notified  Pulse (!) 101   Temp 98.1 F (36.7 C) (Oral)   Resp 17   Ht 5' 4.75" (1.645 m)   Wt 47.3 kg (104 lb 4.4 oz)   LMP 07/23/2017 Comment: on chemo  SpO2 100%   BMI 17.49 kg/m  Pain Assessment: Faces   Pain Score: Asleep   SpO2: SpO2: 100 % O2 Device:SpO2: 100 % O2 Flow Rate: .   IO: Intake/output summary:  Intake/Output Summary (Last 24 hours) at 08/09/17 1042 Last data filed at 08/09/17 0400  Gross per 24 hour  Intake          1031.25 ml  Output                 1 ml  Net          1030.25 ml    LBM:   Baseline Weight: Weight: 49.9 kg (110 lb) Most recent weight: Weight: 47.3 kg (104 lb 4.4 oz)     Palliative Assessment/Data: 30 %   Discussed with Dr Doyle Askew and Dr Burr Medico   Time In: 0930 Time Out: 1045 Time Total: 75 min Greater than 50%  of this time was spent counseling and coordinating care related to the above assessment and plan.  Signed by: Wadie Lessen, NP   Please contact Palliative Medicine Team phone at [phone] for questions and concerns.  For individual provider: See Shea Evans

## 2017-08-09 NOTE — Progress Notes (Signed)
[facility]/Trauma Progress Note      Assessment/Plan S/P 18 Fr Jejunostomy tube placement 04/20/17 Dr. Stark Klein - asked to see for concerns of leak around tube - noticeable skin breakdown and pressure wounds - small appreciable leak noted around tube - daily wound care with BID dressing changes with barrier cream to protect the skin - will order tube study to look for leak   LOS: 1 day    Subjective: CC: tube leak  Objective: Vital signs in last 24 hours: Temp:  [98.1 F (36.7 C)-98.3 F (36.8 C)] 98.1 F (36.7 C) (10/02 0534) Pulse Rate:  [100-101] 101 (10/02 0534) Resp:  [17-20] 17 (10/02 0534) BP: (139-154)/(94-100) 154/100 (10/02 0534) SpO2:  [100 %] 100 % (10/02 0534)    Intake/Output from previous day: 10/01 0701 - 10/02 0700 In: 1031.3 [P.O.:360; I.V.:671.3] Out: 1 [Stool:1] Intake/Output this shift: No intake/output data recorded.  PE: Gen:  Alert, NAD, pleasant, cooperative Abd: skin breakdown noted around tube with pressure type injury on the left side, small leak noted around tube, does not appear be painful    Anti-infectives: Anti-infectives    None      Lab Results:   Recent Labs  08/08/17 0630 08/09/17 0510  WBC 4.8 5.3  HGB 10.4* 10.4*  HCT 32.6* 32.6*  PLT 388 413*   BMET  Recent Labs  08/08/17 0630 08/09/17 0510  NA 143 142  K 4.5 4.1  CL 107 106  CO2 26 27  GLUCOSE 117* 94  BUN 12 13  CREATININE 0.52 0.52  CALCIUM 8.4* 8.3*   PT/INR  Recent Labs  08/07/17 1928  LABPROT 13.6  INR 1.05   CMP     Component Value Date/Time   NA 142 08/09/2017 0510   NA 138 07/20/2017 0950   K 4.1 08/09/2017 0510   K 4.1 07/20/2017 0950   CL 106 08/09/2017 0510   CO2 27 08/09/2017 0510   CO2 26 07/20/2017 0950   GLUCOSE 94 08/09/2017 0510   GLUCOSE 93 07/20/2017 0950   BUN 13 08/09/2017 0510   BUN 10.6 07/20/2017 0950   CREATININE 0.52 08/09/2017 0510   CREATININE 0.6 07/20/2017 0950   CALCIUM 8.3 (L)  08/09/2017 0510   CALCIUM 9.0 07/20/2017 0950   PROT 5.9 (L) 08/09/2017 0510   PROT 7.3 07/20/2017 0950   ALBUMIN 2.2 (L) 08/09/2017 0510   ALBUMIN 3.2 (L) 07/20/2017 0950   AST 23 08/09/2017 0510   AST 42 (H) 07/20/2017 0950   ALT 19 08/09/2017 0510   ALT 39 07/20/2017 0950   ALKPHOS 59 08/09/2017 0510   ALKPHOS 77 07/20/2017 0950   BILITOT 0.6 08/09/2017 0510   BILITOT <0.22 07/20/2017 0950   GFRNONAA >60 08/09/2017 0510   GFRAA >60 08/09/2017 0510   Lipase     Component Value Date/Time   LIPASE 18 08/09/2017 0510    Studies/Results: Dg Abdomen Acute W/chest  Result Date: 08/07/2017 CLINICAL DATA:  Abdominal pain, nausea and vomiting. Malignant ascites. Lethargy. Currently being treated for gastric cancer. EXAM: DG ABDOMEN ACUTE W/ 1V CHEST COMPARISON:  Abdomen pelvis CT obtained yesterday. Chest, abdomen pelvis CT dated 08/01/2017. FINDINGS: Normal sized heart. Clear lungs. Left subclavian porta catheter tip in the inferior aspect of the superior vena cava. Again demonstrated are mildly dilated loops of jejunum with a jejunal catheter in place. These contain air-fluid levels. No free peritoneal air. There is less central is a shin of the bowel loops following large volume paracentesis yesterday.  Unremarkable bones. IMPRESSION: 1. No significant change in mild jejunal ileus or partial obstruction. 2. Decreased amount of ascites following paracentesis yesterday. 3. No acute chest abnormality. Electronically Signed   By: Claudie Revering M.D.   On: 08/07/2017 19:02      Kalman Drape , [facility] 08/09/2017, 1:17 PM Pager: [phone] Consults: [phone] Mon-Fri 7:00 am-4:30 pm Sat-Sun 7:00 am-11:30 am

## 2017-08-09 NOTE — Progress Notes (Signed)
Patient ID: Claire Bray, female   DOB: 12/25/1975, 41 y.o.   MRN: [id number] Request received for consideration of tunneled peritoneal catheter placement in pt. She is known to our service from recent paracentesis on 08/01/17 yielding 2.8 L of fluid. She has  malignant ascites and metastatic gastric cancer with peritoneal carcinomatosis. Her case has been discussed with Dr. Kathlene Cote. He states that patient would be candidate for Pleurx catheter placement if she has completed chemotherapy, transitioned to hospice and continues to have frequent and significant recurrent ascites. Placement of Pleurx catheter during chemotherapy would place patient at increased risk for peritonitis. In the meantime patient could continue to receive therapeutic paracenteses as needed. Please page Dr. Kathlene Cote [phone] with any additional questions.

## 2017-08-09 NOTE — Telephone Encounter (Signed)
Canceled all patient appointments for today. Patient is in the hospital. Per 10/2 sch message.

## 2017-08-10 ENCOUNTER — Encounter (HOSPITAL_COMMUNITY): Payer: Self-pay | Admitting: Interventional Radiology

## 2017-08-10 ENCOUNTER — Inpatient Hospital Stay (HOSPITAL_COMMUNITY): Payer: Medicaid Other

## 2017-08-10 DIAGNOSIS — C169 Malignant neoplasm of stomach, unspecified: Secondary | ICD-10-CM

## 2017-08-10 DIAGNOSIS — R110 Nausea: Secondary | ICD-10-CM

## 2017-08-10 DIAGNOSIS — R112 Nausea with vomiting, unspecified: Secondary | ICD-10-CM

## 2017-08-10 DIAGNOSIS — R188 Other ascites: Secondary | ICD-10-CM

## 2017-08-10 DIAGNOSIS — C799 Secondary malignant neoplasm of unspecified site: Secondary | ICD-10-CM

## 2017-08-10 HISTORY — PX: IR REPLC DUODEN/JEJUNO TUBE PERCUT W/FLUORO: IMG2334

## 2017-08-10 HISTORY — PX: IR PARACENTESIS: IMG2679

## 2017-08-10 LAB — CBC
HEMATOCRIT: 33.5 % — AB (ref 36.0–46.0)
HEMOGLOBIN: 10.5 g/dL — AB (ref 12.0–15.0)
MCH: 27.6 pg (ref 26.0–34.0)
MCHC: 31.3 g/dL (ref 30.0–36.0)
MCV: 87.9 fL (ref 78.0–100.0)
PLATELETS: 462 10*3/uL — AB (ref 150–400)
RBC: 3.81 MIL/uL — ABNORMAL LOW (ref 3.87–5.11)
RDW: 15.8 % — AB (ref 11.5–15.5)
WBC: 4.7 10*3/uL (ref 4.0–10.5)

## 2017-08-10 LAB — BASIC METABOLIC PANEL
Anion gap: 12 (ref 5–15)
BUN: 15 mg/dL (ref 6–20)
CALCIUM: 8.5 mg/dL — AB (ref 8.9–10.3)
CO2: 25 mmol/L (ref 22–32)
CREATININE: 0.54 mg/dL (ref 0.44–1.00)
Chloride: 106 mmol/L (ref 101–111)
GFR calc Af Amer: >60 mL/min (ref >60)
GFR calc non Af Amer: >60 mL/min (ref >60)
Glucose, Bld: 99 mg/dL (ref 65–99)
Potassium: 3.7 mmol/L (ref 3.5–5.1)
Sodium: 143 mmol/L (ref 135–145)

## 2017-08-10 LAB — MAGNESIUM: Magnesium: 2 mg/dL (ref 1.7–2.4)

## 2017-08-10 MED ORDER — LIDOCAINE-EPINEPHRINE (PF) 2 %-1:200000 IJ SOLN
INTRAMUSCULAR | Status: AC | PRN
  Administered 2017-08-10: 10 mL

## 2017-08-10 MED ORDER — PROCHLORPERAZINE EDISYLATE 5 MG/ML IJ SOLN
10.0000 mg | Freq: Four times a day (QID) | INTRAMUSCULAR | Status: DC
  Administered 2017-08-10 – 2017-08-21 (×38): 10 mg via INTRAVENOUS
  Filled 2017-08-10 (×39): qty 2

## 2017-08-10 MED ORDER — LIDOCAINE-EPINEPHRINE (PF) 2 %-1:200000 IJ SOLN
INTRAMUSCULAR | Status: AC
  Filled 2017-08-10: qty 20

## 2017-08-10 MED ORDER — IOPAMIDOL (ISOVUE-300) INJECTION 61%
INTRAVENOUS | Status: AC
  Administered 2017-08-10: 10 mL
  Filled 2017-08-10: qty 50

## 2017-08-10 MED ORDER — FENTANYL 12 MCG/HR TD PT72
12.5000 ug | MEDICATED_PATCH | TRANSDERMAL | Status: DC
  Administered 2017-08-10 – 2017-08-16 (×3): 12.5 ug via TRANSDERMAL
  Filled 2017-08-10 (×3): qty 1

## 2017-08-10 MED ORDER — SODIUM CHLORIDE 0.9 % IV SOLN
8.0000 mg | Freq: Four times a day (QID) | INTRAVENOUS | Status: DC | PRN
  Administered 2017-08-10 – 2017-08-15 (×8): 8 mg via INTRAVENOUS
  Filled 2017-08-10 (×12): qty 4

## 2017-08-10 MED ORDER — PROCHLORPERAZINE EDISYLATE 5 MG/ML IJ SOLN
10.0000 mg | Freq: Four times a day (QID) | INTRAMUSCULAR | Status: DC | PRN
  Administered 2017-08-10: 10 mg via INTRAVENOUS
  Filled 2017-08-10: qty 2

## 2017-08-10 MED ORDER — SODIUM CHLORIDE 0.9% FLUSH: 10.0000 mL | INTRAVENOUS | Status: DC | PRN

## 2017-08-10 MED ORDER — METOPROLOL TARTRATE 5 MG/5ML IV SOLN
2.5000 mg | Freq: Three times a day (TID) | INTRAVENOUS | Status: DC
  Administered 2017-08-10 – 2017-08-12 (×5): 2.5 mg via INTRAVENOUS
  Administered 2017-08-12: 4 mg via INTRAVENOUS
  Administered 2017-08-12 – 2017-08-16 (×11): 2.5 mg via INTRAVENOUS
  Filled 2017-08-10 (×17): qty 5

## 2017-08-10 MED ORDER — ONDANSETRON HCL 4 MG PO TABS: 4.0000 mg | ORAL_TABLET | Freq: Four times a day (QID) | ORAL | Status: DC | PRN

## 2017-08-10 MED ORDER — IOPAMIDOL (ISOVUE-300) INJECTION 61%
10.0000 mL | Freq: Once | INTRAVENOUS | Status: AC | PRN
  Administered 2017-08-10: 10 mL

## 2017-08-10 NOTE — Consult Note (Signed)
[place] Nurse wound consult note Reason for Consult:skin erosion around PEG gtom gastric juices Wound type: MASD Pressure Injury POA: NA Measurement: 3cm circle around J tube and 10cm x 7cm area with multiple erosive areas  Wound OIB:BCWU Drainage (amount, consistency, odor) copious, foul odor Periwound: macerated  Dressing procedure/placement/frequency:Cleansed and dried skin around J tube to apply Pectin Restore. Moved tube to place dressing and about 100cc of fecal smelling liquid with a lot of air sputtering came out. I was debating a fistula bag. Called Surgery PA, Will Creig Hines and he ordered a bedside drainage bag hooked to J tube. When I connected the two about 300cc dark brown liquid immediately ran out. Wound area recleansed, dried, and Restore replaced. Pt going down to IR at this time. We will not follow, but will remain available to this patient, to nursing, and the medical and/or surgical teams.  Please re-consult if we need to assist further.   Fara Olden, RN-C, WTA-C Wound Treatment Associate

## 2017-08-10 NOTE — Progress Notes (Signed)
   08/10/17 1100  Clinical Encounter Type  Visited With Patient and family together;Other (Comment) Engineer, maintenance (IT) )  Visit Type Initial;Psychological support;Spiritual support  Referral From Nurse  Consult/Referral To Chaplain  Spiritual Encounters  Spiritual Needs Emotional;Other (Comment) (Spiritual Conversation/Support)  Stress Factors  Patient Stress Factors Health changes;Major life changes  Family Stress Factors Health changes;Major life changes   I visited with the patient per referral from the care team. The patient was surrounded by her husband and a friend. I was informed by the husband that the patient speaks vietnamese. I was able to pull up the electronic bedside translator provided by the hospital to aid in our conversation.  The patient stated that she was in pain, nauseous and having a hard time breathing. The patient told me that she received a blessing from the [place] priest the other day and that she doesn't have any particular needs at this time.  I provided support to the patient's husband and friend.  Please, contact Spiritual Care for further assistance.   [place] M.Div.

## 2017-08-10 NOTE — Progress Notes (Signed)
MEDICATION-RELATED CONSULT NOTE   IR Procedure Consult - Anticoagulant/Antiplatelet PTA/Inpatient Med List Review by Pharmacist    Procedure: replacement of malfunctioning jejunostomy tube, paracentesis     Completed: 08/10/17 at 1645  Post-Procedural bleeding risk per IR MD assessment: LOW  Antithrombotic medications on inpatient or PTA profile prior to procedure:   Prophylactic dose lovenox    Recommended restart time per IR Post-Procedure Guidelines: Day 0; at least 4 hr postprocedurally   Other considerations:   none   Plan:    Will continue Lovenox 40 mg q24 that is already ordered and scheduled for [place], PharmD, BCPS Pager: [phone] 08/10/2017, 7:51 PM

## 2017-08-10 NOTE — Procedures (Signed)
Pre procedural Dx: Dysphagia, poorly functioning feeding tube. Post procedural Dx: Same  Successful fluoroscopic guided replacement of exisitng 18 Fr jejunostomy tube.   The feeding tube is ready for immediate use.  Successful US guided paracentesis yielding 1.4 L of serous ascites.   EBL: None  Complications: None immediate.  Ronny Bacon, MD Pager #: [phone]

## 2017-08-10 NOTE — Progress Notes (Addendum)
Claire Bray   DOB:December 29, 1975   EU#:[id number]   XVQ#:[id number]  Heme/onc follow-up  Subjective: This patient is well-known to our service with adenocarcinoma of the stomach. She was admitted for persistent nausea, vomiting and abdominal pain. She still has significant nausea and abdominal pain, she has been using IV morphine 4 mg 3-4 times a day lately. She is on clear liquid. Her nausea is intermittent. Due to the feeding tube leak, she has not been getting tube feeds, she is on IV fluids.   Objective:  Vitals:   08/10/17 0447 08/10/17 0622  BP: (!) 173/111 (!) 156/107  Pulse: (!) 104   Resp: 20   Temp: 97.9 F (36.6 C)   SpO2: 99%     Body mass index is 17.49 kg/m.  Intake/Output Summary (Last 24 hours) at 08/10/17 1624 Last data filed at 08/10/17 1012  Gross per 24 hour  Intake               60 ml  Output                0 ml  Net               60 ml     Sclerae unicteric  Oropharynx clear  No peripheral adenopathy  Lungs clear -- no rales or rhonchi  Heart regular rate and rhythm  Abdomen distended and firm, mildly tender, left PEG tube present, gauze stressing C/D/I  MSK no focal spinal tenderness, no peripheral edema  Neuro nonfocal  CBG (last 3)  No results for input(s): GLUCAP in the last 72 hours.   Labs:  Lab Results  Component Value Date   WBC 4.7 08/10/2017   HGB 10.5 (L) 08/10/2017   HCT 33.5 (L) 08/10/2017   MCV 87.9 08/10/2017   PLT 462 (H) 08/10/2017   NEUTROABS 7.0 08/07/2017    Urine Studies No results for input(s): UHGB, CRYS in the last 72 hours.  Invalid input(s): UACOL, UAPR, USPG, UPH, UTP, UGL, UKET, UBIL, UNIT, UROB, ULEU, UEPI, UWBC, URBC, [place], CAST, [place], [place]  Basic Metabolic Panel:  Recent Labs Lab 08/06/17 1613 08/07/17 1928 08/08/17 0630 08/09/17 0510 08/10/17 0500  NA 138 141 143 142 143  K 4.6 4.0 4.5 4.1 3.7  CL 100* 107 107 106 106  CO2 28 26 26 27 25   GLUCOSE 131* 136* 117* 94 99  BUN 13 10 12 13 15    CREATININE 0.57 0.42* 0.52 0.52 0.54  CALCIUM 8.8* 7.9* 8.4* 8.3* 8.5*  MG 2.2  --   --   --  2.0   GFR Estimated Creatinine Clearance: 68.4 mL/min (by C-G formula based on SCr of 0.54 mg/dL). Liver Function Tests:  Recent Labs Lab 08/06/17 1613 08/07/17 1928 08/09/17 0510  AST 48* 31 23  ALT 33 25 19  ALKPHOS 78 62 59  BILITOT 0.2* 0.4 0.6  PROT 7.1 6.0* 5.9*  ALBUMIN 2.6* 2.2* 2.2*    Recent Labs Lab 08/06/17 1613 08/07/17 1928 08/09/17 0510  LIPASE 24 211* 18    Recent Labs Lab 08/07/17 1928  AMMONIA 15   Coagulation profile  Recent Labs Lab 08/07/17 1928  INR 1.05    CBC:  Recent Labs Lab 08/06/17 1613 08/07/17 1928 08/08/17 0630 08/09/17 0510 08/10/17 0500  WBC 7.3 8.5 4.8 5.3 4.7  NEUTROABS 4.8 7.0  --   --   --   HGB 12.6 11.0* 10.4* 10.4* 10.5*  HCT 38.7 34.2* 32.6* 32.6* 33.5*  MCV  86.8 87.0 85.3 87.9 87.9  PLT 485* 428* 388 413* 462*   Cardiac Enzymes: No results for input(s): CKTOTAL, CKMB, CKMBINDEX, TROPONINI in the last 168 hours. BNP: Invalid input(s): POCBNP CBG: No results for input(s): GLUCAP in the last 168 hours. D-Dimer No results for input(s): DDIMER in the last 72 hours. Hgb A1c No results for input(s): HGBA1C in the last 72 hours. Lipid Profile No results for input(s): CHOL, HDL, LDLCALC, TRIG, CHOLHDL, LDLDIRECT in the last 72 hours. Thyroid function studies No results for input(s): TSH, T4TOTAL, T3FREE, THYROIDAB in the last 72 hours.  Invalid input(s): FREET3 Anemia work up No results for input(s): VITAMINB12, FOLATE, FERRITIN, TIBC, IRON, RETICCTPCT in the last 72 hours. Microbiology Recent Results (from the past 240 hour(s))  Culture, blood (Routine X 2) w Reflex to ID Panel     Status: None   Collection Time: 07/31/17 10:04 PM  Result Value Ref Range Status   Specimen Description BLOOD LEFT PORTA CATH  Final   Special Requests   Final    BOTTLES DRAWN AEROBIC AND ANAEROBIC Blood Culture adequate volume    Culture   Final    NO GROWTH 5 DAYS Performed at [facility], [address] [place], [place] [address]    Report Status 08/06/2017 FINAL  Final  Culture, blood (Routine X 2) w Reflex to ID Panel     Status: None   Collection Time: 07/31/17 10:07 PM  Result Value Ref Range Status   Specimen Description BLOOD RIGHT ANTECUBITAL  Final   Special Requests   Final    BOTTLES DRAWN AEROBIC AND ANAEROBIC Blood Culture adequate volume   Culture   Final    NO GROWTH 5 DAYS Performed at [facility], [place] [address], [place], [place] [address]    Report Status 08/06/2017 FINAL  Final  Body fluid culture     Status: None   Collection Time: 08/01/17  2:45 PM  Result Value Ref Range Status   Specimen Description PERITONEAL  Final   Special Requests NONE  Final   Gram Stain   Final    MODERATE WBC PRESENT,BOTH PMN AND MONONUCLEAR NO ORGANISMS SEEN    Culture   Final    NO GROWTH 3 DAYS Performed at [facility], [place] [address], [place], [place] [address]    Report Status 08/05/2017 FINAL  Final  Culture, body fluid-bottle     Status: None (Preliminary result)   Collection Time: 08/06/17  8:23 PM  Result Value Ref Range Status   Specimen Description FLUID PERITONEAL  Final   Special Requests   Final    BOTTLES DRAWN AEROBIC ONLY Blood Culture results may not be optimal due to an excessive volume of blood received in culture bottles   Culture   Final    NO GROWTH 3 DAYS Performed at [facility], [place] [address], [place], [place] [address]    Report Status PENDING  Incomplete  Gram stain     Status: None   Collection Time: 08/06/17  8:23 PM  Result Value Ref Range Status   Specimen Description FLUID PERITONEAL  Final   Special Requests NONE  Final   Gram Stain   Final    FEW WBC PRESENT,BOTH PMN AND MONONUCLEAR NO ORGANISMS SEEN RESULT CALLED TO, READ BACK BY AND VERIFIED WITH: B.JESSEE,RN 0145 08/07/17 M.CAMPBELL Performed at [facility], [place] [address], [place],  [address]    Report Status  08/08/2017 FINAL  Final  Gram stain     Status: None   Collection Time: 08/06/17 11:36 PM  Result Value Ref Range Status   Specimen Description PERITONEAL CAVITY  Final   Special Requests Normal  Final   Gram Stain   Final    CYTOSPIN SMEAR WBC PRESENT,BOTH PMN AND MONONUCLEAR NO ORGANISMS SEEN RESULT CALLED TO, READ BACK BY AND VERIFIED WITH: A MARQUEZ,RN @0059  08/07/17 MKELLY    Report Status 08/07/2017 FINAL  Final  Culture, blood (Routine x 2)     Status: None (Preliminary result)   Collection Time: 08/07/17  6:07 PM  Result Value Ref Range Status   Specimen Description BLOOD RIGHT HAND  Final   Special Requests   Final    BOTTLES DRAWN AEROBIC ONLY Blood Culture adequate volume   Culture   Final    NO GROWTH 2 DAYS Performed at [facility], [place] [address], [place], [place] [address]    Report Status PENDING  Incomplete    Studies:  Dg Abdomen Peg Tube Location  Result Date: 08/09/2017 CLINICAL DATA:  41 year old female with history of jejunostomy tube. Evaluate for potential leak. EXAM: ABDOMEN - 1 VIEW COMPARISON:  Abdominal radiograph 08/07/2017. FINDINGS: Injection of indwelling jejunostomy tube was performed. All contrast material appears to be intraluminal within loops of small bowel. Dilatation of one of the loops up to 6.5 cm in diameter is noted (nonspecific). Increased density in the periphery of the abdomen bilaterally, likely reflective of underlying ascites. IMPRESSION: 1. No contrast extravasation to suggest jejunostomy tube leak. Electronically Signed   By: Vinnie Langton M.D.   On: 08/09/2017 19:32    Assessment: 41 y.o. 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 refractory nausea, vomiting, fatigue, abdominal pain, secondary to #2 2.  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  hydronephrosis secondary to ascites 5.  severe protein calorie malnutrition  Plan:  -I communicated with the patient and her family via virtual [place] interpreter -Her J-tube dye study was negative. Dr. Burr Medico spoke with Dr. Barry Dienes who placed the original J-tube and she suggested to exchange the J-tube by IR.  -Discussed this case with Dr. Pascal Lux, the patient has agreed to IR J-tube exchange and therapeutic paracentesis today, I placed orders -Will hold Pleurx placement for now due to risk of peritonitis, will arrange outpatient weekly therapeutic paracentesis  -I discussed with Dr. Grandville Silos today -Continue supportive care for pain and nausea, she has been using IV morphine 4 mg, 3-4 times a day, pain is not well controlled, I  Dr. Burr Medico will add fentanyl patch 12.68mcg/hr today  -Continue clear liquids diet for now -Will continue to follow up  Alla Feeling, NP 08/10/2017  4:24 PM   I have seen the patient, examined her. I agree with the assessment and and plan and have edited the notes.   Truitt Merle  08/10/2017

## 2017-08-10 NOTE — Progress Notes (Signed)
PROGRESS NOTE    Claire Bray ENI  DPO:[id number] DOB: 08-13-1976 DOA: 08/07/2017 PCP: Patient, No Pcp Per    Brief Narrative:  41 y.o.femalewith known metastatic adenocarcinoma of the stomach, diagnosed in 6/18, has feeding tube, presented for evaluation of progressively worsening abd pain with vomiting. Per record review, pt has completed three cycles of chemo with FOLFOX. Pt was just seen in ED three days PTA for same concern, at that time she had Ct abd done which was notable for hydronephrosis due to mass effect from progressive malignant ascites, ? omental and pelvic peritoneal metastatic disease, ? Cervical mass or metastatic disease. Patient had 3 L paracentesis done, felt better and was discharged home.    Assessment & Plan:   Principal Problem:   Refractory nausea and vomiting Active Problems:   Gastric cancer (HCC)   Protein-calorie malnutrition, severe   Anemia in neoplastic disease  #1 refractory nausea/ vomiting/ abdominal pain Likely secondary to metastatic gastric cancer to the peritoneum with malignant ascites. J-tube study done negative for any leakage. Patient subsequently underwent exchange of J-tube per IR 08/10/2017 per discussions with Dr.Feng  and Dr. Barry Dienes. Oncology holding off on Pleurx placement now due to increased risk for peritonitis. Per oncology patient will likely need weekly therapeutic paracentesis. Will place on scheduled Compazine. Continue current pain regimen. Patient has been started on a Duragesic patch per oncology. Oncology following and appreciate input and recommendations.  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 Patient status post J-tube exchange per IR as noted in problem #1. Patient status post paracenteses per interventional radiology 08/10/2017 uterine 1.4 L of serous fluid. Patient has been started on a fentanyl patch for pain management. Will place  on scheduled IV Compazine. Hematology/oncology holding off on prior X catheter placement due to risk of peritonitis. Patient will likely require weekly therapeutic paracentesis done oncology will arrange per day and notes. Continue supportive care. Oncology following and appreciate input and recommendations.  #3 severe protein calorie malnutrition Secondary to problem #2. J-tube has been exchange per IR. Patient currently with nausea and emesis and abdominal pain and a such will monitor through today. If nausea and emesis improves could probably resume patient back on Osmolite per nutrition recommendations tomorrow.  #4 hydronephrosis secondary to ascites Patient status post paracenteses with 1.4 L of serous fluid removed. Renal function stable. Urine output not recorded. Follow.  #5 anemia of neoplastic disease/iron deficiency anemia   patient with no overt bleeding. Hemoglobin stable at 10.5. Follow H&H.    DVT prophylaxis: Lovenox Code Status: Full Family Communication: Updated patient via interpreter. Disposition Plan: To be determined.   Consultants:   Palliative care Dr.Anwar 08/09/2017  Gen. surgery Dr.Gerkin 08/09/2017  Oncology Dr. Burr Medico 08/08/2017  Procedures:   CT abdomen and pelvis 08/06/2017  Abdominal films 08/07/2017  Fluoroscopic guided replacement of jejunostomy tube 08/10/2017 per Dr. Pascal Lux  Ultrasound-guided paracentesis yielding 1.4 L of serous fluid per Dr. Pascal Lux 08/10/2017  DG abdomen PEG tube location 08/09/2017  Antimicrobials:   None   Subjective: Patient with complaints of nausea and emesis. Patient with complaints of abdominal pain. No flatus. No bowel movement recently. No chest pain. No shortness of breath.  Objective: Vitals:   08/09/17 0534 08/09/17 1408 08/10/17 0447 08/10/17 0622  BP: (!) 154/100 (!) 156/110 (!) 173/111 (!) 156/107  Pulse: (!) 101 95 (!) 104   Resp: 17 20 20    Temp: 98.1 F (36.7 C)  98 F (36.7 C) 97.9 F (36.6 C)    TempSrc: Oral Oral Oral   SpO2: 100% 100% 99%   Weight:      Height:        Intake/Output Summary (Last 24 hours) at 08/10/17 1548 Last data filed at 08/10/17 1012  Gross per 24 hour  Intake               60 ml  Output                0 ml  Net               60 ml   Filed Weights   08/07/17 1727 08/08/17 0826  Weight: 49.9 kg (110 lb) 47.3 kg (104 lb 4.4 oz)    Examination:  General exam: Appears calm and comfortable  Respiratory system: Clear to auscultation Anterior lung fields. Respiratory effort normal. Cardiovascular system: S1 & S2 heard, RRR. No JVD, murmurs, rubs, gallops or clicks. No pedal edema. Gastrointestinal system: Abdomen is nondistended, firm, some tenderness to palpation, hypoactive bowel sounds. Feeding tube site with some feculent drainage around the site of insertion. Central nervous system: Alert and oriented. No focal neurological deficits. Extremities: Symmetric 5 x 5 power. Skin: No rashes, lesions or ulcers Psychiatry: Judgement and insight appear normal. Mood & affect appropriate.     Data Reviewed: I have personally reviewed following labs and imaging studies  CBC:  Recent Labs Lab 08/06/17 1613 08/07/17 1928 08/08/17 0630 08/09/17 0510 08/10/17 0500  WBC 7.3 8.5 4.8 5.3 4.7  NEUTROABS 4.8 7.0  --   --   --   HGB 12.6 11.0* 10.4* 10.4* 10.5*  HCT 38.7 34.2* 32.6* 32.6* 33.5*  MCV 86.8 87.0 85.3 87.9 87.9  PLT 485* 428* 388 413* 326*   Basic Metabolic Panel:  Recent Labs Lab 08/06/17 1613 08/07/17 1928 08/08/17 0630 08/09/17 0510 08/10/17 0500  NA 138 141 143 142 143  K 4.6 4.0 4.5 4.1 3.7  CL 100* 107 107 106 106  CO2 28 26 26 27 25   GLUCOSE 131* 136* 117* 94 99  BUN 13 10 12 13 15   CREATININE 0.57 0.42* 0.52 0.52 0.54  CALCIUM 8.8* 7.9* 8.4* 8.3* 8.5*  MG 2.2  --   --   --  2.0   GFR: Estimated Creatinine Clearance: 68.4 mL/min (by C-G formula based on SCr of 0.54 mg/dL). Liver Function Tests:  Recent Labs Lab  08/06/17 1613 08/07/17 1928 08/09/17 0510  AST 48* 31 23  ALT 33 25 19  ALKPHOS 78 62 59  BILITOT 0.2* 0.4 0.6  PROT 7.1 6.0* 5.9*  ALBUMIN 2.6* 2.2* 2.2*    Recent Labs Lab 08/06/17 1613 08/07/17 1928 08/09/17 0510  LIPASE 24 211* 18    Recent Labs Lab 08/07/17 1928  AMMONIA 15   Coagulation Profile:  Recent Labs Lab 08/07/17 1928  INR 1.05   Cardiac Enzymes: No results for input(s): CKTOTAL, CKMB, CKMBINDEX, TROPONINI in the last 168 hours. BNP (last 3 results) No results for input(s): PROBNP in the last 8760 hours. HbA1C: No results for input(s): HGBA1C in the last 72 hours. CBG: No results for input(s): GLUCAP in the last 168 hours. Lipid Profile: No results for input(s): CHOL, HDL, LDLCALC, TRIG, CHOLHDL, LDLDIRECT in the last 72 hours. Thyroid Function Tests: No results for input(s): TSH, T4TOTAL, FREET4, T3FREE, THYROIDAB in the last 72 hours. Anemia Panel: No results for input(s): VITAMINB12, FOLATE, FERRITIN, TIBC, IRON, RETICCTPCT in the  last 72 hours. Sepsis Labs:  Recent Labs Lab 08/07/17 1944  LATICACIDVEN 0.74    Recent Results (from the past 240 hour(s))  Culture, blood (Routine X 2) w Reflex to ID Panel     Status: None   Collection Time: 07/31/17 10:04 PM  Result Value Ref Range Status   Specimen Description BLOOD LEFT PORTA CATH  Final   Special Requests   Final    BOTTLES DRAWN AEROBIC AND ANAEROBIC Blood Culture adequate volume   Culture   Final    NO GROWTH 5 DAYS Performed at [facility], [address] [place], [place] [address]    Report Status 08/06/2017 FINAL  Final  Culture, blood (Routine X 2) w Reflex to ID Panel     Status: None   Collection Time: 07/31/17 10:07 PM  Result Value Ref Range Status   Specimen Description BLOOD RIGHT ANTECUBITAL  Final   Special Requests   Final    BOTTLES DRAWN AEROBIC AND ANAEROBIC Blood Culture adequate volume   Culture   Final    NO GROWTH 5 DAYS Performed at [facility], [place] [address] [place], [place] [address]    Report Status 08/06/2017 FINAL  Final  Body fluid culture     Status: None   Collection Time: 08/01/17  2:45 PM  Result Value Ref Range Status   Specimen Description PERITONEAL  Final   Special Requests NONE  Final   Gram Stain   Final    MODERATE WBC PRESENT,BOTH PMN AND MONONUCLEAR NO ORGANISMS SEEN    Culture   Final    NO GROWTH 3 DAYS Performed at [facility], [place] [address] [place], [place] [address]    Report Status 08/05/2017 FINAL  Final  Culture, body fluid-bottle     Status: None (Preliminary result)   Collection Time: 08/06/17  8:23 PM  Result Value Ref Range Status   Specimen Description FLUID PERITONEAL  Final   Special Requests   Final    BOTTLES DRAWN AEROBIC ONLY Blood Culture results may not be optimal due to an excessive volume of blood received in culture bottles   Culture   Final    NO GROWTH 3 DAYS Performed at [facility], [place] [address], [place], [place] [address]    Report Status PENDING  Incomplete  Gram stain     Status: None   Collection Time: 08/06/17  8:23 PM  Result Value Ref Range Status   Specimen Description FLUID PERITONEAL  Final   Special Requests NONE  Final   Gram Stain   Final    FEW WBC PRESENT,BOTH PMN AND MONONUCLEAR NO ORGANISMS SEEN RESULT CALLED TO, READ BACK BY AND VERIFIED WITH: B.JESSEE,RN 0145 08/07/17 M.CAMPBELL Performed at [facility], [place] [address] [place], [place] [address]    Report Status 08/08/2017 FINAL  Final  Gram stain     Status: None   Collection Time: 08/06/17 11:36 PM  Result Value Ref Range Status   Specimen Description PERITONEAL CAVITY  Final   Special Requests Normal  Final   Gram Stain   Final    CYTOSPIN SMEAR WBC PRESENT,BOTH PMN AND MONONUCLEAR NO ORGANISMS SEEN RESULT CALLED TO, READ BACK BY AND VERIFIED WITH: A MARQUEZ,RN @0059  08/07/17 MKELLY    Report Status 08/07/2017 FINAL  Final  Culture, blood (Routine x 2)      Status: None (Preliminary result)   Collection Time: 08/07/17  6:07 PM  Result Value Ref Range Status   Specimen Description BLOOD RIGHT HAND  Final   Special Requests   Final    BOTTLES DRAWN AEROBIC ONLY Blood Culture adequate volume   Culture   Final    NO GROWTH 2 DAYS Performed at [facility], [address] [place], [place] [address]    Report Status PENDING  Incomplete         Radiology Studies: Dg Abdomen Peg Tube Location  Result Date: 08/09/2017 CLINICAL DATA:  41 year old female with history of jejunostomy tube. Evaluate for potential leak. EXAM: ABDOMEN - 1 VIEW COMPARISON:  Abdominal radiograph 08/07/2017. FINDINGS: Injection of indwelling jejunostomy tube was performed. All contrast material appears to be intraluminal within loops of small bowel. Dilatation of one of the loops up to 6.5 cm in diameter is noted (nonspecific). Increased density in the periphery of the abdomen bilaterally, likely reflective of underlying ascites. IMPRESSION: 1. No contrast extravasation to suggest jejunostomy tube leak. Electronically Signed   By: Vinnie Langton M.D.   On: 08/09/2017 19:32        Scheduled Meds: . enoxaparin (LOVENOX) injection  40 mg Subcutaneous QHS  . iopamidol      . lidocaine-EPINEPHrine      . lidocaine-prilocaine   Topical Once   Continuous Infusions: . sodium chloride 75 mL/hr at 08/10/17 0856  . ondansetron (ZOFRAN) IV       LOS: 2 days    Time spent: 95 minutes    THOMPSON,DANIEL, MD [facility] Pager [phone]  If 7PM-7AM, please contact night-coverage [email or web address] Password [email or web address] 08/10/2017, 3:48 PM

## 2017-08-10 NOTE — Progress Notes (Signed)
[facility]/Trauma Progress Note      Assessment/Plan    Refractory nausea and vomiting   Gastric cancer (HCC)   Protein-calorie malnutrition, severe   Anemia in neoplastic disease   Ascites  S/P 18 Fr Jejunostomy tube placement 04/20/17 Dr. Stark Klein - noticeable skin breakdown and pressure wounds, WOC to help with wound care - tube study showed no leak - questionable enteric contents leaking around tube - J tube placed to gravity and roughly 300cc of brown liquid was appreciated by WOC - IR to perform paracentesis for ascites   - would recommend holding tube feeds due to concerns of ileus vs partial SBO    LOS: 2 days    Subjective: CC: nausea   Pt states she was having normal BM's prior to admission. She has not had a BM or flatus in 2-3 days. She is nauseated. She is not having pain around her tube site. She states the tube was leaking at home and that is what prompted her to come to the hospital.   Objective: Vital signs in last 24 hours: Temp:  [97.9 F (36.6 C)] 97.9 F (36.6 C) (10/03 0447) Pulse Rate:  [104] 104 (10/03 0447) Resp:  [20] 20 (10/03 0447) BP: (156-173)/(107-111) 156/107 (10/03 0622) SpO2:  [99 %] 99 % (10/03 0447)    Intake/Output from previous day: 10/02 0701 - 10/03 0700 In: 120 [P.O.:120] Out: -  Intake/Output this shift: Total I/O In: 14 [P.O.:60] Out: -   PE: Gen:  Alert, NAD, cooperative, appears uncomfortable  Abd: skin breakdown noted around tube with pressure type injury on the left side, small leak noted around tube, does not appear be painful, distended, firm abdomen with few BS appreciated.   Anti-infectives: Anti-infectives    None      Lab Results:   Recent Labs  08/09/17 0510 08/10/17 0500  WBC 5.3 4.7  HGB 10.4* 10.5*  HCT 32.6* 33.5*  PLT 413* 462*   BMET  Recent Labs  08/09/17 0510 08/10/17 0500  NA 142 143  K 4.1 3.7  CL 106 106  CO2 27 25  GLUCOSE 94 99  BUN 13 15  CREATININE  0.52 0.54  CALCIUM 8.3* 8.5*   PT/INR  Recent Labs  08/07/17 1928  LABPROT 13.6  INR 1.05   CMP     Component Value Date/Time   NA 143 08/10/2017 0500   NA 138 07/20/2017 0950   K 3.7 08/10/2017 0500   K 4.1 07/20/2017 0950   CL 106 08/10/2017 0500   CO2 25 08/10/2017 0500   CO2 26 07/20/2017 0950   GLUCOSE 99 08/10/2017 0500   GLUCOSE 93 07/20/2017 0950   BUN 15 08/10/2017 0500   BUN 10.6 07/20/2017 0950   CREATININE 0.54 08/10/2017 0500   CREATININE 0.6 07/20/2017 0950   CALCIUM 8.5 (L) 08/10/2017 0500   CALCIUM 9.0 07/20/2017 0950   PROT 5.9 (L) 08/09/2017 0510   PROT 7.3 07/20/2017 0950   ALBUMIN 2.2 (L) 08/09/2017 0510   ALBUMIN 3.2 (L) 07/20/2017 0950   AST 23 08/09/2017 0510   AST 42 (H) 07/20/2017 0950   ALT 19 08/09/2017 0510   ALT 39 07/20/2017 0950   ALKPHOS 59 08/09/2017 0510   ALKPHOS 77 07/20/2017 0950   BILITOT 0.6 08/09/2017 0510   BILITOT <0.22 07/20/2017 0950   GFRNONAA >60 08/10/2017 0500   GFRAA >60 08/10/2017 0500   Lipase     Component Value Date/Time   LIPASE 18 08/09/2017 0510  Studies/Results: Dg Abdomen Peg Tube Location  Result Date: 08/09/2017 CLINICAL DATA:  41 year old female with history of jejunostomy tube. Evaluate for potential leak. EXAM: ABDOMEN - 1 VIEW COMPARISON:  Abdominal radiograph 08/07/2017. FINDINGS: Injection of indwelling jejunostomy tube was performed. All contrast material appears to be intraluminal within loops of small bowel. Dilatation of one of the loops up to 6.5 cm in diameter is noted (nonspecific). Increased density in the periphery of the abdomen bilaterally, likely reflective of underlying ascites. IMPRESSION: 1. No contrast extravasation to suggest jejunostomy tube leak. Electronically Signed   By: Vinnie Langton M.D.   On: 08/09/2017 19:32      Kalman Drape , [facility] 08/10/2017, 3:33 PM Pager: [phone] Consults: [phone] Mon-Fri 7:00 am-4:30 pm Sat-Sun 7:00  am-11:30 am

## 2017-08-11 DIAGNOSIS — E860 Dehydration: Secondary | ICD-10-CM

## 2017-08-11 DIAGNOSIS — C799 Secondary malignant neoplasm of unspecified site: Secondary | ICD-10-CM

## 2017-08-11 DIAGNOSIS — C169 Malignant neoplasm of stomach, unspecified: Secondary | ICD-10-CM

## 2017-08-11 DIAGNOSIS — R627 Adult failure to thrive: Secondary | ICD-10-CM

## 2017-08-11 DIAGNOSIS — K9413 Enterostomy malfunction: Secondary | ICD-10-CM

## 2017-08-11 DIAGNOSIS — Z515 Encounter for palliative care: Secondary | ICD-10-CM

## 2017-08-11 LAB — CBC
HCT: 33.9 % — ABNORMAL LOW (ref 36.0–46.0)
HEMOGLOBIN: 10.8 g/dL — AB (ref 12.0–15.0)
MCH: 28.4 pg (ref 26.0–34.0)
MCHC: 31.9 g/dL (ref 30.0–36.0)
MCV: 89.2 fL (ref 78.0–100.0)
Platelets: 437 10*3/uL — ABNORMAL HIGH (ref 150–400)
RBC: 3.8 MIL/uL — ABNORMAL LOW (ref 3.87–5.11)
RDW: 15.8 % — AB (ref 11.5–15.5)
WBC: 6.1 10*3/uL (ref 4.0–10.5)

## 2017-08-11 LAB — GLUCOSE, CAPILLARY: GLUCOSE-CAPILLARY: 110 mg/dL — AB (ref 65–99)

## 2017-08-11 LAB — BASIC METABOLIC PANEL
ANION GAP: 13 (ref 5–15)
BUN: 15 mg/dL (ref 6–20)
CALCIUM: 8.2 mg/dL — AB (ref 8.9–10.3)
CO2: 25 mmol/L (ref 22–32)
CREATININE: 0.52 mg/dL (ref 0.44–1.00)
Chloride: 106 mmol/L (ref 101–111)
GFR calc non Af Amer: >60 mL/min (ref >60)
Glucose, Bld: 111 mg/dL — ABNORMAL HIGH (ref 65–99)
Potassium: 3.6 mmol/L (ref 3.5–5.1)
SODIUM: 144 mmol/L (ref 135–145)

## 2017-08-11 MED ORDER — JEVITY 1.2 CAL PO LIQD: 1000.0000 mL | ORAL | Status: DC

## 2017-08-11 MED ORDER — OSMOLITE 1.2 CAL PO LIQD
1000.0000 mL | ORAL | Status: DC
  Administered 2017-08-11: 1000 mL

## 2017-08-11 NOTE — Progress Notes (Addendum)
Patient ID: Claire Bray, female   DOB: Mar 27, 1976, 41 y.o.   MRN: [id number]  This NP visited patient at the bedside as a follow up to initial [name].  With the assistance of the mobile interpreter I discussed with the patient the various medical interventions over the past 24 hours.  Patient appears weak, pale and tired.  Abdomen is hard and with generalized tenderness.  J-tube site with light brown drainage; nursing to change   We discussed the fact that the feeding tube was patent and likely artificial feeding would be restarted today.  She tells me that her pain is manageable with the medications that she is currently on; no changes at this time.  I educated her in the importance of her participation in care; and encouraged her to notify nursing of any questions or concerns, specifically as it relates to symptom management.  Discussed with patient the importance of continued conversation with family and their  medical providers regarding overall plan of care and treatment options,  ensuring decisions are within the context of the patients values and GOCs.  Emotional support offered. Patient shares the importance of her [place] and prayer in this very difficult situation.  Patient remains open to all offered and available medical interventions to prolong life and she is hopeful for improvement.  Questions and concerns addressed.  I let the patient know that this nurse practitioner would not be present in the hospital again until Monday but to call the team phone number with any concerns.  Time in 1000          Time out 1035    Total time spent on the unit was  35 minutes  Discussed with Dr Grandville Silos and unit IDT  Greater than 50% of the time was spent in counseling and coordination of care  Claire Lessen NP  Palliative Medicine Team Team Phone # [phone] Pager [phone]

## 2017-08-11 NOTE — Progress Notes (Signed)
Nutrition Follow-up  DOCUMENTATION CODES:   Severe malnutrition in context of chronic illness, Underweight  INTERVENTION:   Osmolite 1.2 @ 20 ml/hr, advance by 10 ml every 4 hours to goal rate of 65 ml/hr.  RD will continue to monitor  NUTRITION DIAGNOSIS:   Malnutrition(severe) related to chronic illness, cancer and cancer related treatments as evidenced by severe depletion of body fat, severe depletion of muscle mass.  Ongoing.  GOAL:   Patient will meet greater than or equal to 90% of their needs  Not meeting.  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 Per record review, pt has completed three cycles of chemo with FOLFOX. Pt was just seen in ED three days PTA for same concern, at that time she had Ct abd done which was notable for hydronephrosis due to mass effect from progressive malignant ascites, ? omental and pelvic peritoneal metastatic disease, ? Cervical mass or metastatic disease. Patient had 3 L paracentesis done, felt better and was discharged home.   Patient's J-tube replaced via IR 10/3. Able to use today. MD placed order for Osmolite 1.2 @ 20 ml/hr, will advance every 4 hours to goal rate of 65 ml/hr. RD placed TF protocol in addition to order.  S/p paracentesis which yielded 1.4L.  Pt consumed 25% of clear liquids this morning. Will continue to monitor for tolerance of TF and advancement.  Labs reviewed. Medications: IV Compazine every 6 hours, IV Zofran PRN, IV Phenergan PRN  Diet Order:  Diet clear liquid Room service appropriate? Yes; Fluid consistency: Thin  Skin:  Reviewed, no issues  Last BM:  PTA  Height:   Ht Readings from Last 1 Encounters:  08/07/17 5' 4.75" (1.645 m)    Weight:   Wt Readings from Last 1 Encounters:   08/08/17 104 lb 4.4 oz (47.3 kg)    Ideal Body Weight:  56.8 kg  BMI:  Body mass index is 17.49 kg/m.  Estimated Nutritional Needs:   Kcal:  1500-1700  Protein:  70-80g  Fluid:  1.7L/day  EDUCATION NEEDS:   Education needs no appropriate at this time  Clayton Bibles, MS, RD, LDN Pager: [phone] After Hours Pager: [phone]

## 2017-08-11 NOTE — Progress Notes (Signed)
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 Per record review, pt has completed three cycles of chemo with FOLFOX. Pt was just seen in ED three days PTA for same concern, at that time she had Ct abd done which was notable for hydronephrosis due to mass effect from progressive malignant ascites, ? omental and pelvic peritoneal metastatic disease, ? Cervical mass or metastatic disease. Patient had 3 L paracentesis done, felt better and was discharged home.    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 J-tube study done negative for any leakage. Patient subsequently underwent exchange of J-tube per IR 08/10/2017 per discussions with Dr.Feng  and Dr. Barry Dienes. Oncology holding off on Pleurx placement now due to increased risk for peritonitis. Per oncology patient will likely need weekly therapeutic paracentesis. Clinical improvement with nausea and emesis after being started on scheduled Compazine. Continue current pain regimen. Patient has been started on a Duragesic patch per oncology. Oncology following and appreciate input and recommendations.  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 Patient status post J-tube exchange per IR as noted in problem #1. Patient  status post paracenteses per interventional radiology 08/10/2017 uterine 1.4 L of serous fluid. Patient has been started on a fentanyl patch for pain management. Nausea and emesis improving on scheduled IV Compazine. Hematology/oncology holding off on pleurx catheter placement due to risk of peritonitis. Patient will likely require weekly therapeutic paracentesis done per oncology who will arrange for them. Continue supportive care. Oncology following and appreciate input and recommendations.  #3 severe protein calorie malnutrition/nutrition Secondary to problem #2. J-tube has been exchange per IR. Patient currently with less nausea and emesis after being started on scheduled antiemetics. Patient states some improvement with abdominal pain control. Osmolite was resumed early on today however due to significant drainage around the PEG tube site will discontinue Osmolite until tube site has been assessed by IR.   #4 hydronephrosis secondary to ascites Patient status post paracenteses with 1.4 L of serous fluid removed 08/10/2017. Renal function stable. Urine output not recorded. Follow.  #5 anemia of neoplastic disease/iron deficiency anemia   patient with no overt bleeding. Hemoglobin stable at 10.8. Follow H&H.   #6 ?? J-tube malfunction Per nursing patient noted to have significant drainage noted around insertion site of PEG tube and skin. Per nursing gauze dressing has been significantly soaked. Will discontinue Osmolite tube feeds for now. Wound care nurse has been consulted. Will have interventional radiology assess tube site.   DVT prophylaxis: Lovenox Code Status: Full Family Communication: Updated patient. No family present. Disposition Plan: To be determined.   Consultants:   Palliative care Dr.Anwar 08/09/2017  Gen. surgery Dr.Gerkin 08/09/2017  Oncology Dr. Burr Medico 08/08/2017  Procedures:   CT abdomen and pelvis 08/06/2017  Abdominal films 08/07/2017  Fluoroscopic guided  replacement of jejunostomy tube 08/10/2017  per Dr. Pascal Lux  Ultrasound-guided paracentesis yielding 1.4 L of serous fluid per Dr. Pascal Lux 08/10/2017  DG abdomen PEG tube location 08/09/2017  Antimicrobials:   None   Subjective: Patient states improvement with nausea and emesis. No further emesis have a little bit of nausea. Patient states pain is controlled on current pain regimen. No chest pain. No shortness of breath. Per nursing patient has had significant drainage around insertion site of PEG tube.  Objective: Vitals:   08/10/17 1641 08/10/17 2007 08/11/17 0523 08/11/17 1553  BP: (!) 155/104 (!) 144/109 (!) 140/104 (!) 155/98  Pulse: 96 100 91 93  Resp: 18 18 16 16   Temp: 98.3 F (36.8 C) 99.3 F (37.4 C) 99.1 F (37.3 C) 97.9 F (36.6 C)  TempSrc: Oral Oral Oral Oral  SpO2: 100% 98% 98% 100%  Weight:      Height:        Intake/Output Summary (Last 24 hours) at 08/11/17 1747 Last data filed at 08/11/17 0900  Gross per 24 hour  Intake               60 ml  Output                0 ml  Net               60 ml   Filed Weights   08/07/17 1727 08/08/17 0826  Weight: 49.9 kg (110 lb) 47.3 kg (104 lb 4.4 oz)    Examination:  General exam: In less distress.  Respiratory system: Clear to auscultation With no wheezing, no crackles, no rhonchi.  Respiratory effort normal. Cardiovascular system: S1 & S2 heard, RRR. No JVD, murmurs, rubs, gallops or clicks. No pedal edema. Gastrointestinal system: Abdomen is nondistended, firm, some tenderness to palpation, hypoactive bowel sounds. Feeding tube site with gauze with significant drainage. Central nervous system: Alert and oriented. No focal neurological deficits. Extremities: Symmetric 5 x 5 power. Skin: No rashes, lesions or ulcers Psychiatry: Judgement and insight appear normal. Mood & affect appropriate.     Data Reviewed: I have personally reviewed following labs and imaging studies  CBC:  Recent Labs Lab  08/06/17 1613 08/07/17 1928 08/08/17 0630 08/09/17 0510 08/10/17 0500 08/11/17 0600  WBC 7.3 8.5 4.8 5.3 4.7 6.1  NEUTROABS 4.8 7.0  --   --   --   --   HGB 12.6 11.0* 10.4* 10.4* 10.5* 10.8*  HCT 38.7 34.2* 32.6* 32.6* 33.5* 33.9*  MCV 86.8 87.0 85.3 87.9 87.9 89.2  PLT 485* 428* 388 413* 462* 338*   Basic Metabolic Panel:  Recent Labs Lab 08/06/17 1613 08/07/17 1928 08/08/17 0630 08/09/17 0510 08/10/17 0500 08/11/17 0600  NA 138 141 143 142 143 144  K 4.6 4.0 4.5 4.1 3.7 3.6  CL 100* 107 107 106 106 106  CO2 28 26 26 27 25 25   GLUCOSE 131* 136* 117* 94 99 111*  BUN 13 10 12 13 15 15   CREATININE 0.57 0.42* 0.52 0.52 0.54 0.52  CALCIUM 8.8* 7.9* 8.4* 8.3* 8.5* 8.2*  MG 2.2  --   --   --  2.0  --    GFR: Estimated Creatinine Clearance: 68.4 mL/min (by C-G formula based on SCr of 0.52 mg/dL). Liver Function Tests:  Recent Labs Lab 08/06/17 1613 08/07/17 1928 08/09/17 0510  AST 48* 31 23  ALT 33 25 19  ALKPHOS 78 62 59  BILITOT 0.2* 0.4 0.6  PROT 7.1 6.0* 5.9*  ALBUMIN 2.6* 2.2* 2.2*  Recent Labs Lab 08/06/17 1613 08/07/17 1928 08/09/17 0510  LIPASE 24 211* 18    Recent Labs Lab 08/07/17 1928  AMMONIA 15   Coagulation Profile:  Recent Labs Lab 08/07/17 1928  INR 1.05   Cardiac Enzymes: No results for input(s): CKTOTAL, CKMB, CKMBINDEX, TROPONINI in the last 168 hours. BNP (last 3 results) No results for input(s): PROBNP in the last 8760 hours. HbA1C: No results for input(s): HGBA1C in the last 72 hours. CBG: No results for input(s): GLUCAP in the last 168 hours. Lipid Profile: No results for input(s): CHOL, HDL, LDLCALC, TRIG, CHOLHDL, LDLDIRECT in the last 72 hours. Thyroid Function Tests: No results for input(s): TSH, T4TOTAL, FREET4, T3FREE, THYROIDAB in the last 72 hours. Anemia Panel: No results for input(s): VITAMINB12, FOLATE, FERRITIN, TIBC, IRON, RETICCTPCT in the last 72 hours. Sepsis Labs:  Recent Labs Lab  08/07/17 1944  LATICACIDVEN 0.74    Recent Results (from the past 240 hour(s))  Culture, body fluid-bottle     Status: None (Preliminary result)   Collection Time: 08/06/17  8:23 PM  Result Value Ref Range Status   Specimen Description FLUID PERITONEAL  Final   Special Requests   Final    BOTTLES DRAWN AEROBIC ONLY Blood Culture results may not be optimal due to an excessive volume of blood received in culture bottles   Culture   Final    NO GROWTH 4 DAYS Performed at [facility] [address], [place], [place] [address]    Report Status PENDING  Incomplete  Gram stain     Status: None   Collection Time: 08/06/17  8:23 PM  Result Value Ref Range Status   Specimen Description FLUID PERITONEAL  Final   Special Requests NONE  Final   Gram Stain   Final    FEW WBC PRESENT,BOTH PMN AND MONONUCLEAR NO ORGANISMS SEEN RESULT CALLED TO, READ BACK BY AND VERIFIED WITH: B.JESSEE,RN 0145 08/07/17 M.CAMPBELL Performed at [facility], [place] [address] [place], [place] [address]    Report Status 08/08/2017 FINAL  Final  Gram stain     Status: None   Collection Time: 08/06/17 11:36 PM  Result Value Ref Range Status   Specimen Description PERITONEAL CAVITY  Final   Special Requests Normal  Final   Gram Stain   Final    CYTOSPIN SMEAR WBC PRESENT,BOTH PMN AND MONONUCLEAR NO ORGANISMS SEEN RESULT CALLED TO, READ BACK BY AND VERIFIED WITH: A MARQUEZ,RN @0059  08/07/17 MKELLY    Report Status 08/07/2017 FINAL  Final  Culture, blood (Routine x 2)     Status: None (Preliminary result)   Collection Time: 08/07/17  6:07 PM  Result Value Ref Range Status   Specimen Description BLOOD RIGHT HAND  Final   Special Requests   Final    BOTTLES DRAWN AEROBIC ONLY Blood Culture adequate volume   Culture   Final    NO GROWTH 3 DAYS Performed at [facility], [place] [address], [place], [place] [address]    Report Status PENDING  Incomplete         Radiology Studies: Ir Replc  Duoden/jejuno Tube Percut W/fluoro  Result Date: 08/10/2017 INDICATION: History of gastric cancer with chronic surgically placed jejunostomy catheter. The jejunostomy catheter is currently leaking. Please perform fluoroscopic guided injection and potential exchange Recurrent malignant ascites. Please perform ultrasound-guided paracentesis. EXAM: FLUOROSCOPIC GUIDED REPLACEMENT OF JEJUNOSTOMY TUBE COMPARISON:  CT abdomen pelvis - 08/06/2017 MEDICATIONS: None CONTRAST:  50 mL ISOVUE-300 IOPAMIDOL (  ISOVUE-300) INJECTION 61% - administered via the jejunostomy catheter FLUOROSCOPY TIME:  7 minutes 42 seconds (31.4 mGy) COMPLICATIONS: None immediate. PROCEDURE: Informed written consent was obtained from the patient (via the use of a medical translator) after a discussion of the risks, benefits and alternatives to treatment. Questions regarding the procedure were encouraged and answered. A timeout was performed prior to the initiation of the procedure. Ultrasound scanning demonstrated small to moderate volume recurrent intra-abdominal ascites. The skin overlying the right mid lateral abdomen was prepped and draped in usual sterile fashion. At the overlying soft tissues were anesthetized with 1% lidocaine with epinephrine, a Yueh sheath needle was advanced into the peritoneal space. Ultrasound image was saved for procedural documentation purposes. Next, paracentesis was performed yielding 1.4 L of serous fluid. The Yueh sheath catheter was removed and a dressing was placed. Attention was now paid towards the leaking jejunostomy catheter. External portion of the existing jejunostomy catheter as well as the surrounding skin were prepped and draped in the usual sterile fashion, and a sterile drape was applied covering the operative field. Maximum barrier sterile technique with sterile gowns and gloves were used for the procedure. A preprocedural spot fluoroscopic image demonstrates abrupt kinking of the mid aspect of the  existing jejunostomy catheter. Contrast injection demonstrates peripheral antral opacification of the proximal aspect of the jejunostomy catheter (additional sideholes had been cut along the length of the jejunostomy catheter as is customary with surgical placement). The external portion of jejunostomy catheter was cut and the catheter was cannulated with a stiff Glidewire which was advanced into the jejunum. A new 49 French balloon retention jejunostomy catheter was trimmed approximately and half. Next, the new shortened jejunostomy catheter was advanced over the Glidewire. The retention balloon was inflated and the balloon was cinched. Contrast was injected and several spot fluoroscopic images were obtained in various obliquities confirming intraluminal positioning. The patient tolerated both procedures well without immediate postprocedural complication. IMPRESSION: 1. Successful fluoroscopic guided placement of a new 18-French jejunostomy tube. The new jejunostomy tube is ready for immediate use. 2. Successful ultrasound-guided paracentesis yielding 1.4 L of serous fluid. Electronically Signed   By: Sandi Mariscal M.D.   On: 08/10/2017 16:47   Dg Abd 2 Views  Result Date: 08/10/2017 CLINICAL DATA:  Nausea and vomiting EXAM: ABDOMEN - 2 VIEW COMPARISON:  None. FINDINGS: There is retained enteric contrast in small bowel. Nonspecific air-fluid levels in small bowel are noted. There is no free intraperitoneal gas. A jejunostomy tube projects over the left hemiabdomen. IMPRESSION: No free intraperitoneal gas.  Nonobstructive bowel gas pattern. Electronically Signed   By: Marybelle Killings M.D.   On: 08/10/2017 20:06   Ir Paracentesis  Result Date: 08/10/2017 INDICATION: History of gastric cancer with chronic surgically placed jejunostomy catheter. The jejunostomy catheter is currently leaking. Please perform fluoroscopic guided injection and potential exchange Recurrent malignant ascites. Please perform  ultrasound-guided paracentesis. EXAM: FLUOROSCOPIC GUIDED REPLACEMENT OF JEJUNOSTOMY TUBE COMPARISON:  CT abdomen pelvis - 08/06/2017 MEDICATIONS: None CONTRAST:  50 mL ISOVUE-300 IOPAMIDOL (ISOVUE-300) INJECTION 61% - administered via the jejunostomy catheter FLUOROSCOPY TIME:  7 minutes 42 seconds (97.0 mGy) COMPLICATIONS: None immediate. PROCEDURE: Informed written consent was obtained from the patient (via the use of a medical translator) after a discussion of the risks, benefits and alternatives to treatment. Questions regarding the procedure were encouraged and answered. A timeout was performed prior to the initiation of the procedure. Ultrasound scanning demonstrated small to moderate volume recurrent intra-abdominal ascites. The skin overlying the  right mid lateral abdomen was prepped and draped in usual sterile fashion. At the overlying soft tissues were anesthetized with 1% lidocaine with epinephrine, a Yueh sheath needle was advanced into the peritoneal space. Ultrasound image was saved for procedural documentation purposes. Next, paracentesis was performed yielding 1.4 L of serous fluid. The Yueh sheath catheter was removed and a dressing was placed. Attention was now paid towards the leaking jejunostomy catheter. External portion of the existing jejunostomy catheter as well as the surrounding skin were prepped and draped in the usual sterile fashion, and a sterile drape was applied covering the operative field. Maximum barrier sterile technique with sterile gowns and gloves were used for the procedure. A preprocedural spot fluoroscopic image demonstrates abrupt kinking of the mid aspect of the existing jejunostomy catheter. Contrast injection demonstrates peripheral antral opacification of the proximal aspect of the jejunostomy catheter (additional sideholes had been cut along the length of the jejunostomy catheter as is customary with surgical placement). The external portion of jejunostomy catheter  was cut and the catheter was cannulated with a stiff Glidewire which was advanced into the jejunum. A new 70 French balloon retention jejunostomy catheter was trimmed approximately and half. Next, the new shortened jejunostomy catheter was advanced over the Glidewire. The retention balloon was inflated and the balloon was cinched. Contrast was injected and several spot fluoroscopic images were obtained in various obliquities confirming intraluminal positioning. The patient tolerated both procedures well without immediate postprocedural complication. IMPRESSION: 1. Successful fluoroscopic guided placement of a new 18-French jejunostomy tube. The new jejunostomy tube is ready for immediate use. 2. Successful ultrasound-guided paracentesis yielding 1.4 L of serous fluid. Electronically Signed   By: Sandi Mariscal M.D.   On: 08/10/2017 16:47        Scheduled Meds: . enoxaparin (LOVENOX) injection  40 mg Subcutaneous QHS  . fentaNYL  12.5 mcg Transdermal Q72H  . lidocaine-prilocaine   Topical Once  . metoprolol tartrate  2.5 mg Intravenous Q8H  . prochlorperazine  10 mg Intravenous Q6H   Continuous Infusions: . sodium chloride 75 mL/hr at 08/11/17 0953  . ondansetron (ZOFRAN) IV Stopped (08/11/17 1325)     LOS: 3 days    Time spent: 91 minutes    Desmond Tufano, MD [facility] Pager [phone]  If 7PM-7AM, please contact night-coverage [email or web address] Password [email or web address] 08/11/2017, 5:47 PM

## 2017-08-11 NOTE — Progress Notes (Signed)
The left side feeding tub drains at times copious tan clear fluid. It drains through 5 splits gauze and 6- 4X4 . Some times the fluid bubbles up and ozzs  out sometimes before  the dressing can be applied.Dr Marcello Moores has seen the pt I  have applied 2 split gauze  4 4x4's and 2 ABD pads over the feeding tube . Feedings were started and only  infused approx,2-3 ml . When Dr Grandville Silos gave me an order to stop the fluids.

## 2017-08-12 ENCOUNTER — Encounter (HOSPITAL_COMMUNITY): Payer: Self-pay | Admitting: Interventional Radiology

## 2017-08-12 ENCOUNTER — Inpatient Hospital Stay (HOSPITAL_COMMUNITY): Payer: Medicaid Other

## 2017-08-12 HISTORY — PX: IR CM INJ ANY COLONIC TUBE W/FLUORO: IMG2336

## 2017-08-12 LAB — CULTURE, BODY FLUID-BOTTLE

## 2017-08-12 LAB — BASIC METABOLIC PANEL
ANION GAP: 12 (ref 5–15)
BUN: 15 mg/dL (ref 6–20)
CALCIUM: 8.5 mg/dL — AB (ref 8.9–10.3)
CO2: 26 mmol/L (ref 22–32)
Chloride: 108 mmol/L (ref 101–111)
Creatinine, Ser: 0.51 mg/dL (ref 0.44–1.00)
GLUCOSE: 103 mg/dL — AB (ref 65–99)
POTASSIUM: 3.8 mmol/L (ref 3.5–5.1)
Sodium: 146 mmol/L — ABNORMAL HIGH (ref 135–145)

## 2017-08-12 LAB — CULTURE, BODY FLUID W GRAM STAIN -BOTTLE: Culture: NO GROWTH

## 2017-08-12 LAB — GLUCOSE, CAPILLARY: GLUCOSE-CAPILLARY: 101 mg/dL — AB (ref 65–99)

## 2017-08-12 MED ORDER — IOPAMIDOL (ISOVUE-300) INJECTION 61%
INTRAVENOUS | Status: AC
  Administered 2017-08-12: 50 mL via INTRAVENOUS
  Filled 2017-08-12: qty 50

## 2017-08-12 MED ORDER — IOPAMIDOL (ISOVUE-300) INJECTION 61%
50.0000 mL | Freq: Once | INTRAVENOUS | Status: AC | PRN
  Administered 2017-08-12: 50 mL via INTRAVENOUS

## 2017-08-12 MED ORDER — DEXTROSE-NACL 5-0.45 % IV SOLN
INTRAVENOUS | Status: AC
Start: 2017-08-12 — End: 2017-08-13
  Administered 2017-08-12 – 2017-08-13 (×4): via INTRAVENOUS

## 2017-08-12 MED ORDER — MORPHINE SULFATE (PF) 4 MG/ML IV SOLN
INTRAVENOUS | Status: AC
  Filled 2017-08-12: qty 1

## 2017-08-12 MED ORDER — ONDANSETRON HCL 4 MG/2ML IJ SOLN
INTRAMUSCULAR | Status: AC
  Administered 2017-08-12: 4 mg via INTRAVENOUS
  Filled 2017-08-12: qty 2

## 2017-08-12 NOTE — Consult Note (Signed)
WOC Nurse wound consult note Reason for Consult:leakage around G tube Wound type:tube insertion Pressure Injury POA: NA Measurement:na Wound bed:na Drainage (amount, consistency, odor) copious brown sour smelling exudate Periwound:improved from 11/3 Dressing procedure/placement/frequency:Met with Dr. Thompson and IR was at bedside to assess situation.  Pt has new tube placed since I was here 10/3. Leaking copious amounts of dark brown sour smelling drainage.  Did not smell fecal today. I replaced the Replica wafer to protect skin from the acidic drainage.  I explained to Dr Thompson that protecting the skin around the tube is my role and I can do nothing about the tube leaking.  IR at bedside, tube flushes without leaking, the leaking is separate from what is going in the tube. IR will return later today to assess again.  I have placed a barrier ring, then Replicare wafer, divided the ABD and spliced around the tube, placed the tube anchor tight against the ABD and wrapped remaining barrier ring around tube to keep anchor in place. (This was suggested by IR) Discussed this with bedside RN. We will not follow, but will remain available to this patient, to nursing, and the medical and/or surgical teams.  Please re-consult if we need to assist further with skin care around tube, we are unable to assist with leak problem.    , RN-C, WTA-C Wound Treatment Associate  

## 2017-08-12 NOTE — Progress Notes (Signed)
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 Per record review, pt has completed three cycles of chemo with FOLFOX. Pt was just seen in ED three days PTA for same concern, at that time she had Ct abd done which was notable for hydronephrosis due to mass effect from progressive malignant ascites, ? omental and pelvic peritoneal metastatic disease, ? Cervical mass or metastatic disease. Patient had 3 L paracentesis done, felt better and was discharged home.    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 J-tube study done negative for any leakage. Patient subsequently underwent exchange of J-tube per IR 08/10/2017 per discussions with Dr.Feng  and Dr. Barry Dienes. Oncology holding off on Pleurx placement now due to increased risk for peritonitis. Per oncology patient will likely need weekly therapeutic paracentesis. Patient with some worsening abdominal pain, abdomen more firm and more distended with further nausea and emesis despite scheduled Compazine. PEG tube site with some feculent drainage around insertion site. IR and general surgery to reassess that site. Continue scheduled Compazine, current pain regimen. Continue Duragesic patch. Oncology following and appreciate input and  recommendations.  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 Patient status post J-tube exchange per IR as noted in problem #1. Patient status post paracenteses per interventional radiology 08/10/2017 uterine 1.4 L of serous fluid. Patient has been started on a fentanyl patch for pain management. Nausea and emesis worsening again this morning with some abdominal distention and firmness with abdomen. PEG tube site with some drainage with a feculent odor. Per hematology/oncology IR and general surgery to assess PEG site. Patient with worsening significant abdominal distention and firmness and now with further nausea and vomiting with drainage noted around PEG tube site. Concern for possible reaccumulation of malignant ascites. Hematology/oncology holding off on pleurx catheter placement due to risk of peritonitis. Patient will likely require weekly therapeutic paracentesis done per oncology who will arrange for them. Continue supportive care. IR to further assess today on reaccumulation of ascitic fluid. Oncology following and appreciate input and recommendations.  #3 severe protein calorie malnutrition/nutrition Secondary to problem #2. J-tube has been exchange per IR. Patient currently with less nausea and emesis after being started on scheduled antiemetics. Patient with complaints of abdominal pain again today. Patient's abdomen somewhat distended and more firm and tighter today. Osmolite has been discontinued due to issues surrounding PEG tube. Hematology/oncology, Dr. Pilar Plate to discuss with patient concerning possibility of starting TPN for nutrition due to issues surrounding PEG tube site and metastatic malignant gastric cancer.   #4 hydronephrosis secondary to ascites Patient status post paracenteses with 1.4 L of serous fluid removed 08/10/2017. Renal function stable. Urine output not recorded. Renal function stable.  Follow.  #5 anemia of neoplastic disease/iron deficiency  anemia   patient with no overt bleeding. Hemoglobin stable at 10.8. Follow H&H.   #6 ?? J-tube malfunction Per nursing patient noted to have significant drainage noted around insertion site of PEG tube and skin. Per nursing gauze dressing has been significantly soaked. Osmolite tube feeds have been held since yesterday. Wound care nurses, and assessed the patient. Interventional radiology assessing the patient. Per Dr. Burr Medico, hematology/oncology general surgery and IR have been notified to reassess the patient.   DVT prophylaxis: Lovenox Code Status: Full Family Communication: Updated patient and family at bedside.  Disposition Plan: To be determined.   Consultants:   Palliative care Dr.Anwar 08/09/2017  Gen. surgery Dr.Gerkin 08/09/2017  Oncology Dr. Burr Medico 08/08/2017  Procedures:   CT abdomen and pelvis 08/06/2017  Abdominal films 08/07/2017  Fluoroscopic guided replacement of jejunostomy tube 08/10/2017 per Dr. Pascal Lux  Ultrasound-guided paracentesis yielding 1.4 L of serous fluid per Dr. Pascal Lux 08/10/2017  DG abdomen PEG tube location 08/09/2017  Antimicrobials:   None   Subjective: Patient laying in bed easily arousable. Patient complaining of abdominal pain. Patient complaining of some shortness of breath. Patient with complaints of nausea and emesis. Patient noted to have significant drainage around insertion of PEG site per nursing with some feculent drainage.  Objective: Vitals:   08/11/17 0523 08/11/17 1553 08/11/17 2040 08/12/17 0500  BP: (!) 140/104 (!) 155/98 (!) 163/101 (!) 152/102  Pulse: 91 93 (!) 101 95  Resp: 16 16 16 16   Temp: 99.1 F (37.3 C) 97.9 F (36.6 C) 98.2 F (36.8 C) 98 F (36.7 C)  TempSrc: Oral Oral Oral Oral  SpO2: 98% 100% 100% 98%  Weight:    50.3 kg (111 lb)  Height:        Intake/Output Summary (Last 24 hours) at 08/12/17 1224 Last data filed at 08/12/17 0825  Gross per  24 hour  Intake          6355.67 ml  Output                0 ml  Net          6355.67 ml   Filed Weights   08/07/17 1727 08/08/17 0826 08/12/17 0500  Weight: 49.9 kg (110 lb) 47.3 kg (104 lb 4.4 oz) 50.3 kg (111 lb)    Examination:  General exam: In less distress.  Respiratory system: Clear to auscultation anterior lung fields. Respiratory effort normal. Cardiovascular system: S1 & S2 heard, RRR. No JVD, murmurs, rubs, gallops or clicks. No pedal edema. Gastrointestinal system: Abdomen is Mildly distended, firm, some tenderness to palpation, positive bowel sounds. Feeding tube site with gauze with significant drainage. Central nervous system: Alert and oriented. No focal neurological deficits. Extremities: Symmetric 5 x 5 power. Skin: No rashes, lesions or ulcers Psychiatry: Judgement and insight appear normal. Mood & affect appropriate.     Data Reviewed: I have personally reviewed following labs and imaging studies  CBC:  Recent Labs Lab 08/06/17 1613 08/07/17 1928 08/08/17 0630 08/09/17 0510 08/10/17 0500 08/11/17 0600  WBC 7.3 8.5 4.8 5.3 4.7 6.1  NEUTROABS 4.8 7.0  --   --   --   --   HGB 12.6 11.0* 10.4* 10.4* 10.5* 10.8*  HCT 38.7 34.2* 32.6* 32.6* 33.5* 33.9*  MCV 86.8 87.0 85.3 87.9 87.9 89.2  PLT 485* 428* 388 413* 462* 034*   Basic Metabolic Panel:  Recent Labs Lab 08/06/17 1613  08/08/17 0630 08/09/17 0510 08/10/17 0500 08/11/17 0600 08/12/17 0500  NA 138  < >  143 142 143 144 146*  K 4.6  < > 4.5 4.1 3.7 3.6 3.8  CL 100*  < > 107 106 106 106 108  CO2 28  < > 26 27 25 25 26   GLUCOSE 131*  < > 117* 94 99 111* 103*  BUN 13  < > 12 13 15 15 15   CREATININE 0.57  < > 0.52 0.52 0.54 0.52 0.51  CALCIUM 8.8*  < > 8.4* 8.3* 8.5* 8.2* 8.5*  MG 2.2  --   --   --  2.0  --   --   < > = values in this interval not displayed. GFR: Estimated Creatinine Clearance: 72.7 mL/min (by C-G formula based on SCr of 0.51 mg/dL). Liver Function Tests:  Recent  Labs Lab 08/06/17 1613 08/07/17 1928 08/09/17 0510  AST 48* 31 23  ALT 33 25 19  ALKPHOS 78 62 59  BILITOT 0.2* 0.4 0.6  PROT 7.1 6.0* 5.9*  ALBUMIN 2.6* 2.2* 2.2*    Recent Labs Lab 08/06/17 1613 08/07/17 1928 08/09/17 0510  LIPASE 24 211* 18    Recent Labs Lab 08/07/17 1928  AMMONIA 15   Coagulation Profile:  Recent Labs Lab 08/07/17 1928  INR 1.05   Cardiac Enzymes: No results for input(s): CKTOTAL, CKMB, CKMBINDEX, TROPONINI in the last 168 hours. BNP (last 3 results) No results for input(s): PROBNP in the last 8760 hours. HbA1C: No results for input(s): HGBA1C in the last 72 hours. CBG:  Recent Labs Lab 08/11/17 2034 08/12/17 0426  GLUCAP 110* 101*   Lipid Profile: No results for input(s): CHOL, HDL, LDLCALC, TRIG, CHOLHDL, LDLDIRECT in the last 72 hours. Thyroid Function Tests: No results for input(s): TSH, T4TOTAL, FREET4, T3FREE, THYROIDAB in the last 72 hours. Anemia Panel: No results for input(s): VITAMINB12, FOLATE, FERRITIN, TIBC, IRON, RETICCTPCT in the last 72 hours. Sepsis Labs:  Recent Labs Lab 08/07/17 1944  LATICACIDVEN 0.74    Recent Results (from the past 240 hour(s))  Culture, body fluid-bottle     Status: None (Preliminary result)   Collection Time: 08/06/17  8:23 PM  Result Value Ref Range Status   Specimen Description FLUID PERITONEAL  Final   Special Requests   Final    BOTTLES DRAWN AEROBIC ONLY Blood Culture results may not be optimal due to an excessive volume of blood received in culture bottles   Culture   Final    NO GROWTH 4 DAYS Performed at [facility] [address], [place], [place] [address]    Report Status PENDING  Incomplete  Gram stain     Status: None   Collection Time: 08/06/17  8:23 PM  Result Value Ref Range Status   Specimen Description FLUID PERITONEAL  Final   Special Requests NONE  Final   Gram Stain   Final    FEW WBC PRESENT,BOTH PMN AND MONONUCLEAR NO ORGANISMS SEEN RESULT  CALLED TO, READ BACK BY AND VERIFIED WITH: B.JESSEE,RN 0145 08/07/17 M.CAMPBELL Performed at [facility], [place] [address] [place], [place] [address]    Report Status 08/08/2017 FINAL  Final  Gram stain     Status: None   Collection Time: 08/06/17 11:36 PM  Result Value Ref Range Status   Specimen Description PERITONEAL CAVITY  Final   Special Requests Normal  Final   Gram Stain   Final    CYTOSPIN SMEAR WBC PRESENT,BOTH PMN AND MONONUCLEAR NO ORGANISMS SEEN RESULT CALLED TO, READ BACK BY AND VERIFIED WITH: A  MARQUEZ,RN @0059  08/07/17 MKELLY    Report Status 08/07/2017 FINAL  Final  Culture, blood (Routine x 2)     Status: None (Preliminary result)   Collection Time: 08/07/17  6:07 PM  Result Value Ref Range Status   Specimen Description BLOOD RIGHT HAND  Final   Special Requests   Final    BOTTLES DRAWN AEROBIC ONLY Blood Culture adequate volume   Culture   Final    NO GROWTH 3 DAYS Performed at [facility], [place] [address] [place], [place] [address]    Report Status PENDING  Incomplete         Radiology Studies: Ir Replc Duoden/jejuno Tube Percut W/fluoro  Result Date: 08/10/2017 INDICATION: History of gastric cancer with chronic surgically placed jejunostomy catheter. The jejunostomy catheter is currently leaking. Please perform fluoroscopic guided injection and potential exchange Recurrent malignant ascites. Please perform ultrasound-guided paracentesis. EXAM: FLUOROSCOPIC GUIDED REPLACEMENT OF JEJUNOSTOMY TUBE COMPARISON:  CT abdomen pelvis - 08/06/2017 MEDICATIONS: None CONTRAST:  50 mL ISOVUE-300 IOPAMIDOL (ISOVUE-300) INJECTION 61% - administered via the jejunostomy catheter FLUOROSCOPY TIME:  7 minutes 42 seconds (67.1 mGy) COMPLICATIONS: None immediate. PROCEDURE: Informed written consent was obtained from the patient (via the use of a medical translator) after a discussion of the risks, benefits and alternatives to treatment. Questions regarding the  procedure were encouraged and answered. A timeout was performed prior to the initiation of the procedure. Ultrasound scanning demonstrated small to moderate volume recurrent intra-abdominal ascites. The skin overlying the right mid lateral abdomen was prepped and draped in usual sterile fashion. At the overlying soft tissues were anesthetized with 1% lidocaine with epinephrine, a Yueh sheath needle was advanced into the peritoneal space. Ultrasound image was saved for procedural documentation purposes. Next, paracentesis was performed yielding 1.4 L of serous fluid. The Yueh sheath catheter was removed and a dressing was placed. Attention was now paid towards the leaking jejunostomy catheter. External portion of the existing jejunostomy catheter as well as the surrounding skin were prepped and draped in the usual sterile fashion, and a sterile drape was applied covering the operative field. Maximum barrier sterile technique with sterile gowns and gloves were used for the procedure. A preprocedural spot fluoroscopic image demonstrates abrupt kinking of the mid aspect of the existing jejunostomy catheter. Contrast injection demonstrates peripheral antral opacification of the proximal aspect of the jejunostomy catheter (additional sideholes had been cut along the length of the jejunostomy catheter as is customary with surgical placement). The external portion of jejunostomy catheter was cut and the catheter was cannulated with a stiff Glidewire which was advanced into the jejunum. A new 93 French balloon retention jejunostomy catheter was trimmed approximately and half. Next, the new shortened jejunostomy catheter was advanced over the Glidewire. The retention balloon was inflated and the balloon was cinched. Contrast was injected and several spot fluoroscopic images were obtained in various obliquities confirming intraluminal positioning. The patient tolerated both procedures well without immediate postprocedural  complication. IMPRESSION: 1. Successful fluoroscopic guided placement of a new 18-French jejunostomy tube. The new jejunostomy tube is ready for immediate use. 2. Successful ultrasound-guided paracentesis yielding 1.4 L of serous fluid. Electronically Signed   By: Sandi Mariscal M.D.   On: 08/10/2017 16:47   Dg Abd 2 Views  Result Date: 08/10/2017 CLINICAL DATA:  Nausea and vomiting EXAM: ABDOMEN - 2 VIEW COMPARISON:  None. FINDINGS: There is retained enteric contrast in small bowel. Nonspecific air-fluid levels in small bowel are noted. There is no free intraperitoneal  gas. A jejunostomy tube projects over the left hemiabdomen. IMPRESSION: No free intraperitoneal gas.  Nonobstructive bowel gas pattern. Electronically Signed   By: Marybelle Killings M.D.   On: 08/10/2017 20:06   Ir Paracentesis  Result Date: 08/10/2017 INDICATION: History of gastric cancer with chronic surgically placed jejunostomy catheter. The jejunostomy catheter is currently leaking. Please perform fluoroscopic guided injection and potential exchange Recurrent malignant ascites. Please perform ultrasound-guided paracentesis. EXAM: FLUOROSCOPIC GUIDED REPLACEMENT OF JEJUNOSTOMY TUBE COMPARISON:  CT abdomen pelvis - 08/06/2017 MEDICATIONS: None CONTRAST:  50 mL ISOVUE-300 IOPAMIDOL (ISOVUE-300) INJECTION 61% - administered via the jejunostomy catheter FLUOROSCOPY TIME:  7 minutes 42 seconds (54.6 mGy) COMPLICATIONS: None immediate. PROCEDURE: Informed written consent was obtained from the patient (via the use of a medical translator) after a discussion of the risks, benefits and alternatives to treatment. Questions regarding the procedure were encouraged and answered. A timeout was performed prior to the initiation of the procedure. Ultrasound scanning demonstrated small to moderate volume recurrent intra-abdominal ascites. The skin overlying the right mid lateral abdomen was prepped and draped in usual sterile fashion. At the overlying soft  tissues were anesthetized with 1% lidocaine with epinephrine, a Yueh sheath needle was advanced into the peritoneal space. Ultrasound image was saved for procedural documentation purposes. Next, paracentesis was performed yielding 1.4 L of serous fluid. The Yueh sheath catheter was removed and a dressing was placed. Attention was now paid towards the leaking jejunostomy catheter. External portion of the existing jejunostomy catheter as well as the surrounding skin were prepped and draped in the usual sterile fashion, and a sterile drape was applied covering the operative field. Maximum barrier sterile technique with sterile gowns and gloves were used for the procedure. A preprocedural spot fluoroscopic image demonstrates abrupt kinking of the mid aspect of the existing jejunostomy catheter. Contrast injection demonstrates peripheral antral opacification of the proximal aspect of the jejunostomy catheter (additional sideholes had been cut along the length of the jejunostomy catheter as is customary with surgical placement). The external portion of jejunostomy catheter was cut and the catheter was cannulated with a stiff Glidewire which was advanced into the jejunum. A new 40 French balloon retention jejunostomy catheter was trimmed approximately and half. Next, the new shortened jejunostomy catheter was advanced over the Glidewire. The retention balloon was inflated and the balloon was cinched. Contrast was injected and several spot fluoroscopic images were obtained in various obliquities confirming intraluminal positioning. The patient tolerated both procedures well without immediate postprocedural complication. IMPRESSION: 1. Successful fluoroscopic guided placement of a new 18-French jejunostomy tube. The new jejunostomy tube is ready for immediate use. 2. Successful ultrasound-guided paracentesis yielding 1.4 L of serous fluid. Electronically Signed   By: Sandi Mariscal M.D.   On: 08/10/2017 16:47         Scheduled Meds: . enoxaparin (LOVENOX) injection  40 mg Subcutaneous QHS  . fentaNYL  12.5 mcg Transdermal Q72H  . lidocaine-prilocaine   Topical Once  . metoprolol tartrate  2.5 mg Intravenous Q8H  . prochlorperazine  10 mg Intravenous Q6H   Continuous Infusions: . dextrose 5 % and 0.45% NaCl 75 mL/hr at 08/12/17 0832  . ondansetron (ZOFRAN) IV Stopped (08/11/17 1325)     LOS: 4 days    Time spent: 54 minutes    THOMPSON,DANIEL, MD [facility] Pager [phone]  If 7PM-7AM, please contact night-coverage [email or web address] Password [email or web address] 08/12/2017, 12:24 PM

## 2017-08-12 NOTE — Care Management Note (Signed)
Case Management Note  Patient Details  Name: Claire Bray MRN: [id number] Date of Birth: Oct 28, 1976  Subjective/Objective:             41 yo admitted with nausea and vomiting. Hx of gastric adenocarcinoma with peritoneal metastases       Action/Plan: From home with spouse. Pt was on continuous tube feedings of Osmolite 1.2 to her J tube prior to admission. Will need new script of regimen changes. CM will continue to follow along.  Expected Discharge Date:   (unknown)               Expected Discharge Plan:  Home/Self Care  In-House Referral:     Discharge planning Services  CM Consult  Post Acute Care Choice:    Choice offered to:     DME Arranged:    DME Agency:     HH Arranged:    HH Agency:     Status of Service:  In process, will continue to follow  If discussed at Long Length of Stay Meetings, dates discussed:    Additional CommentsLynnell Catalan, RN 08/12/2017, 12:53 PM [phone]

## 2017-08-12 NOTE — Progress Notes (Signed)
Feeding tube. Was covered with several 4x4 gauze pads and 3 ABD pads, The dressing was all saturated with tan   fluid and 1/3 of brown stool. Area was cleaned. I used 4 split gauze and 3 4x4 split gauze and secured with hypafix tape. Barriar cream was applied to the left side of the feeding tube. Dr Maggie Font was made aware

## 2017-08-12 NOTE — Progress Notes (Signed)
Referring Physician(s): Thompson,D  Supervising Physician: Sandi Mariscal  Patient Status:  [name] - In-pt  Chief Complaint: Gastric cancer, abdominal pain , nausea/vomiting   Subjective:  Pt still with abdominal pain, nausea and vomiting. Has not had bowel movement recently. Called today by nurse to examine jejunostomy insertion site. There has been reports of feculent material draining from insertion site before feeds were initiated.  Allergies: Patient has no known allergies.  Medications: Prior to Admission medications   Medication Sig Start Date End Date Taking? Authorizing Provider  dicyclomine (BENTYL) 20 MG tablet Take 1 tablet (20 mg total) by mouth 2 (two) times daily. Patient not taking: Reported on 08/06/2017 04/02/17   Montine Circle, PA-C  HYDROcodone-acetaminophen (NORCO/VICODIN) 5-325 MG tablet Take 1-2 tablets by mouth every 6 (six) hours as needed for pain. 08/01/17   [provider]  ibuprofen (ADVIL,MOTRIN) 600 MG tablet Take 1 tablet (600 mg total) by mouth every 6 (six) hours. Patient not taking: Reported on 08/06/2017 12/14/12   Nolon Rod, DO  lidocaine-prilocaine (EMLA) cream Apply to portacath site 1 hour prior to use 04/27/17   Owens Shark, NP  metoCLOPramide (REGLAN) 5 MG tablet Take 2 tablets (10 mg total) by mouth 4 (four) times daily -  before meals and at bedtime. Patient not taking: Reported on 08/06/2017 04/06/17   Marcille Buffy D, CNM  Nutritional Supplements (FEEDING SUPPLEMENT, OSMOLITE 1.2 CAL,) LIQD Place 1,000 mLs into feeding tube continuous. Patient not taking: Reported on 08/06/2017 04/24/17   Stark Klein, MD  ondansetron (ZOFRAN) 8 MG tablet Take 1 tablet (8 mg total) by mouth every 8 (eight) hours as needed for nausea or vomiting. 08/06/17   Deno Etienne, DO  pantoprazole (PROTONIX) 40 MG tablet Take 1 tablet (40 mg total) by mouth daily at 12 noon. Patient not taking: Reported on 08/06/2017 04/24/17   Stark Klein, MD  polyethylene  glycol [facility]) packet Take half a pack (8.5 oz) every 12 hours. Adjust as needed for soft stools Patient not taking: Reported on 08/06/2017 04/29/17   Payton Emerald, MD  promethazine (PHENERGAN) 25 MG suppository Place 1 suppository (25 mg total) rectally every 6 (six) hours as needed for nausea. 04/17/17   Varney Biles, MD  zolpidem (AMBIEN) 5 MG tablet Take 1 tablet (5 mg total) by mouth at bedtime as needed for sleep. Patient not taking: Reported on 08/06/2017 12/11/12   Martha Clan, MD     Vital Signs: BP (!) 152/102 (BP Location: Left Arm)   Pulse 95   Temp 98 F (36.7 C) (Oral)   Resp 16   Ht 5' 4.75" (1.645 m)   Wt 111 lb (50.3 kg)   LMP 07/23/2017 Comment: on chemo  SpO2 98%   BMI 18.61 kg/m   Physical Exam jejunostomy tube intact, insertion site with minimal erythema, some granulation tissue as well as induration of adjacent subcutaneous tissue; jejunostomy irrigated with 30 mL of normal saline without difficulty or apparent reflux. Abdomen firm.  Imaging: Ir Replc Duoden/jejuno Tube Percut W/fluoro  Result Date: 08/10/2017 INDICATION: History of gastric cancer with chronic surgically placed jejunostomy catheter. The jejunostomy catheter is currently leaking. Please perform fluoroscopic guided injection and potential exchange Recurrent malignant ascites. Please perform ultrasound-guided paracentesis. EXAM: FLUOROSCOPIC GUIDED REPLACEMENT OF JEJUNOSTOMY TUBE COMPARISON:  CT abdomen pelvis - 08/06/2017 MEDICATIONS: None CONTRAST:  50 mL ISOVUE-300 IOPAMIDOL (ISOVUE-300) INJECTION 61% - administered via the jejunostomy catheter FLUOROSCOPY TIME:  7 minutes 42 seconds (67.1 mGy) COMPLICATIONS: None immediate. PROCEDURE:  Informed written consent was obtained from the patient (via the use of a medical translator) after a discussion of the risks, benefits and alternatives to treatment. Questions regarding the procedure were encouraged and answered. A timeout was performed prior to the  initiation of the procedure. Ultrasound scanning demonstrated small to moderate volume recurrent intra-abdominal ascites. The skin overlying the right mid lateral abdomen was prepped and draped in usual sterile fashion. At the overlying soft tissues were anesthetized with 1% lidocaine with epinephrine, a Yueh sheath needle was advanced into the peritoneal space. Ultrasound image was saved for procedural documentation purposes. Next, paracentesis was performed yielding 1.4 L of serous fluid. The Yueh sheath catheter was removed and a dressing was placed. Attention was now paid towards the leaking jejunostomy catheter. External portion of the existing jejunostomy catheter as well as the surrounding skin were prepped and draped in the usual sterile fashion, and a sterile drape was applied covering the operative field. Maximum barrier sterile technique with sterile gowns and gloves were used for the procedure. A preprocedural spot fluoroscopic image demonstrates abrupt kinking of the mid aspect of the existing jejunostomy catheter. Contrast injection demonstrates peripheral antral opacification of the proximal aspect of the jejunostomy catheter (additional sideholes had been cut along the length of the jejunostomy catheter as is customary with surgical placement). The external portion of jejunostomy catheter was cut and the catheter was cannulated with a stiff Glidewire which was advanced into the jejunum. A new 31 French balloon retention jejunostomy catheter was trimmed approximately and half. Next, the new shortened jejunostomy catheter was advanced over the Glidewire. The retention balloon was inflated and the balloon was cinched. Contrast was injected and several spot fluoroscopic images were obtained in various obliquities confirming intraluminal positioning. The patient tolerated both procedures well without immediate postprocedural complication. IMPRESSION: 1. Successful fluoroscopic guided placement of a new  18-French jejunostomy tube. The new jejunostomy tube is ready for immediate use. 2. Successful ultrasound-guided paracentesis yielding 1.4 L of serous fluid. Electronically Signed   By: Sandi Mariscal M.D.   On: 08/10/2017 16:47   Dg Abdomen Peg Tube Location  Result Date: 08/09/2017 CLINICAL DATA:  41 year old female with history of jejunostomy tube. Evaluate for potential leak. EXAM: ABDOMEN - 1 VIEW COMPARISON:  Abdominal radiograph 08/07/2017. FINDINGS: Injection of indwelling jejunostomy tube was performed. All contrast material appears to be intraluminal within loops of small bowel. Dilatation of one of the loops up to 6.5 cm in diameter is noted (nonspecific). Increased density in the periphery of the abdomen bilaterally, likely reflective of underlying ascites. IMPRESSION: 1. No contrast extravasation to suggest jejunostomy tube leak. Electronically Signed   By: Vinnie Langton M.D.   On: 08/09/2017 19:32   Dg Abd 2 Views  Result Date: 08/10/2017 CLINICAL DATA:  Nausea and vomiting EXAM: ABDOMEN - 2 VIEW COMPARISON:  None. FINDINGS: There is retained enteric contrast in small bowel. Nonspecific air-fluid levels in small bowel are noted. There is no free intraperitoneal gas. A jejunostomy tube projects over the left hemiabdomen. IMPRESSION: No free intraperitoneal gas.  Nonobstructive bowel gas pattern. Electronically Signed   By: Marybelle Killings M.D.   On: 08/10/2017 20:06   Ir Paracentesis  Result Date: 08/10/2017 INDICATION: History of gastric cancer with chronic surgically placed jejunostomy catheter. The jejunostomy catheter is currently leaking. Please perform fluoroscopic guided injection and potential exchange Recurrent malignant ascites. Please perform ultrasound-guided paracentesis. EXAM: FLUOROSCOPIC GUIDED REPLACEMENT OF JEJUNOSTOMY TUBE COMPARISON:  CT abdomen pelvis - 08/06/2017 MEDICATIONS: None CONTRAST:  50 mL ISOVUE-300 IOPAMIDOL (ISOVUE-300) INJECTION 61% - administered via the  jejunostomy catheter FLUOROSCOPY TIME:  7 minutes 42 seconds (91.4 mGy) COMPLICATIONS: None immediate. PROCEDURE: Informed written consent was obtained from the patient (via the use of a medical translator) after a discussion of the risks, benefits and alternatives to treatment. Questions regarding the procedure were encouraged and answered. A timeout was performed prior to the initiation of the procedure. Ultrasound scanning demonstrated small to moderate volume recurrent intra-abdominal ascites. The skin overlying the right mid lateral abdomen was prepped and draped in usual sterile fashion. At the overlying soft tissues were anesthetized with 1% lidocaine with epinephrine, a Yueh sheath needle was advanced into the peritoneal space. Ultrasound image was saved for procedural documentation purposes. Next, paracentesis was performed yielding 1.4 L of serous fluid. The Yueh sheath catheter was removed and a dressing was placed. Attention was now paid towards the leaking jejunostomy catheter. External portion of the existing jejunostomy catheter as well as the surrounding skin were prepped and draped in the usual sterile fashion, and a sterile drape was applied covering the operative field. Maximum barrier sterile technique with sterile gowns and gloves were used for the procedure. A preprocedural spot fluoroscopic image demonstrates abrupt kinking of the mid aspect of the existing jejunostomy catheter. Contrast injection demonstrates peripheral antral opacification of the proximal aspect of the jejunostomy catheter (additional sideholes had been cut along the length of the jejunostomy catheter as is customary with surgical placement). The external portion of jejunostomy catheter was cut and the catheter was cannulated with a stiff Glidewire which was advanced into the jejunum. A new 60 French balloon retention jejunostomy catheter was trimmed approximately and half. Next, the new shortened jejunostomy catheter was  advanced over the Glidewire. The retention balloon was inflated and the balloon was cinched. Contrast was injected and several spot fluoroscopic images were obtained in various obliquities confirming intraluminal positioning. The patient tolerated both procedures well without immediate postprocedural complication. IMPRESSION: 1. Successful fluoroscopic guided placement of a new 18-French jejunostomy tube. The new jejunostomy tube is ready for immediate use. 2. Successful ultrasound-guided paracentesis yielding 1.4 L of serous fluid. Electronically Signed   By: Sandi Mariscal M.D.   On: 08/10/2017 16:47    Labs:  CBC:  Recent Labs  08/08/17 0630 08/09/17 0510 08/10/17 0500 08/11/17 0600  WBC 4.8 5.3 4.7 6.1  HGB 10.4* 10.4* 10.5* 10.8*  HCT 32.6* 32.6* 33.5* 33.9*  PLT 388 413* 462* 437*    COAGS:  Recent Labs  04/19/17 1642 08/07/17 1928  INR 1.07 1.05  APTT 31  --     BMP:  Recent Labs  08/09/17 0510 08/10/17 0500 08/11/17 0600 08/12/17 0500  NA 142 143 144 146*  K 4.1 3.7 3.6 3.8  CL 106 106 106 108  CO2 27 25 25 26   GLUCOSE 94 99 111* 103*  BUN 13 15 15 15   CALCIUM 8.3* 8.5* 8.2* 8.5*  CREATININE 0.52 0.54 0.52 0.51  GFRNONAA >60 >60 >60 >60  GFRAA >60 >60 >60 >60    LIVER FUNCTION TESTS:  Recent Labs  07/31/17 2203 08/06/17 1613 08/07/17 1928 08/09/17 0510  BILITOT 0.7 0.2* 0.4 0.6  AST 47* 48* 31 23  ALT 43 33 25 19  ALKPHOS 89 78 62 59  PROT 6.7 7.1 6.0* 5.9*  ALBUMIN 2.9* 2.6* 2.2* 2.2*    Assessment and Plan: Patient with history of gastric cancer and surgicically placed jejunostomy catheter with recent leaking and exchange in IR  on 08/10/17. Patient also had paracentesis at that time yielding 1.4 L of serous fluid. Now with reports of fecal material draining from jejunostomy insertion site. Abdomen is slightly more firm today than yesterday. Patient has not had recent bowel movement. Jejunostomy tube flushes without difficulty. Old dressings from  jejunostomy tube examined and material does appear feculent; will plan to do G-tube injection and possible paracentesis today. Above plans discussed with primary physician, patient/family and Dr. Pascal Lux.   Electronically Signed: D. Rowe Robert, PA-C 08/12/2017, 12:23 PM    I spent a total of 20 minutes at the the patient's bedside AND on the patient's hospital floor or unit, greater than 50% of which was counseling/coordinating care for jejunostomy tube    Patient ID: Claire Bray, female   DOB: 1976-10-04, 41 y.o.   MRN: [id number]

## 2017-08-12 NOTE — Progress Notes (Signed)
Claire Bray   DOB:1976-08-14   TI#:[id number]   ASN#:[id number]  Heme/onc follow-up  Subjective: Her J-tube was exchanged by IR two days ago, she has had brownish fecal material output at her J-tube insertion site since then, tube feeds stopped. IR saw her again today. She states her abdominal pain has overall better controlled with the final patch, nausea is also improved today. No fever.  Objective:  Vitals:   08/11/17 2040 08/12/17 0500  BP: (!) 163/101 (!) 152/102  Pulse: (!) 101 95  Resp: 16 16  Temp: 98.2 F (36.8 C) 98 F (36.7 C)  SpO2: 100% 98%    Body mass index is 18.61 kg/m.  Intake/Output Summary (Last 24 hours) at 08/12/17 1336 Last data filed at 08/12/17 0825  Gross per 24 hour  Intake          6355.67 ml  Output                0 ml  Net          6355.67 ml     Sclerae unicteric  Oropharynx clear  No peripheral adenopathy  Lungs clear -- no rales or rhonchi  Heart regular rate and rhythm  Abdomen distended and firm, mildly tender, left PEG tube present, gauze stressing C/D/I  MSK no focal spinal tenderness, no peripheral edema  Neuro nonfocal  CBG (last 3)   Recent Labs  08/11/17 2034 08/12/17 0426  GLUCAP 110* 101*     Labs:  Lab Results  Component Value Date   WBC 6.1 08/11/2017   HGB 10.8 (L) 08/11/2017   HCT 33.9 (L) 08/11/2017   MCV 89.2 08/11/2017   PLT 437 (H) 08/11/2017   NEUTROABS 7.0 08/07/2017    Urine Studies No results for input(s): UHGB, CRYS in the last 72 hours.  Invalid input(s): UACOL, UAPR, USPG, UPH, UTP, UGL, UKET, UBIL, UNIT, UROB, [place], UEPI, UWBC, [place], [place], [place], [place], [place]  Basic Metabolic Panel:  Recent Labs Lab 08/06/17 1613  08/08/17 0630 08/09/17 0510 08/10/17 0500 08/11/17 0600 08/12/17 0500  NA 138  < > 143 142 143 144 146*  K 4.6  < > 4.5 4.1 3.7 3.6 3.8  CL 100*  < > 107 106 106 106 108  CO2 28  < > 26 27 25 25 26   GLUCOSE 131*  < > 117* 94 99 111* 103*  BUN 13  < > 12 13 15 15 15    CREATININE 0.57  < > 0.52 0.52 0.54 0.52 0.51  CALCIUM 8.8*  < > 8.4* 8.3* 8.5* 8.2* 8.5*  MG 2.2  --   --   --  2.0  --   --   < > = values in this interval not displayed. GFR Estimated Creatinine Clearance: 72.7 mL/min (by C-G formula based on SCr of 0.51 mg/dL). Liver Function Tests:  Recent Labs Lab 08/06/17 1613 08/07/17 1928 08/09/17 0510  AST 48* 31 23  ALT 33 25 19  ALKPHOS 78 62 59  BILITOT 0.2* 0.4 0.6  PROT 7.1 6.0* 5.9*  ALBUMIN 2.6* 2.2* 2.2*    Recent Labs Lab 08/06/17 1613 08/07/17 1928 08/09/17 0510  LIPASE 24 211* 18    Recent Labs Lab 08/07/17 1928  AMMONIA 15   Coagulation profile  Recent Labs Lab 08/07/17 1928  INR 1.05    CBC:  Recent Labs Lab 08/06/17 1613 08/07/17 1928 08/08/17 0630 08/09/17 0510 08/10/17 0500 08/11/17 0600  WBC 7.3 8.5 4.8 5.3 4.7 6.1  NEUTROABS 4.8 7.0  --   --   --   --   HGB 12.6 11.0* 10.4* 10.4* 10.5* 10.8*  HCT 38.7 34.2* 32.6* 32.6* 33.5* 33.9*  MCV 86.8 87.0 85.3 87.9 87.9 89.2  PLT 485* 428* 388 413* 462* 437*   Cardiac Enzymes: No results for input(s): CKTOTAL, CKMB, CKMBINDEX, TROPONINI in the last 168 hours. BNP: Invalid input(s): POCBNP CBG:  Recent Labs Lab 08/11/17 2034 08/12/17 0426  GLUCAP 110* 101*   D-Dimer No results for input(s): DDIMER in the last 72 hours. Hgb A1c No results for input(s): HGBA1C in the last 72 hours. Lipid Profile No results for input(s): CHOL, HDL, LDLCALC, TRIG, CHOLHDL, LDLDIRECT in the last 72 hours. Thyroid function studies No results for input(s): TSH, T4TOTAL, T3FREE, THYROIDAB in the last 72 hours.  Invalid input(s): FREET3 Anemia work up No results for input(s): VITAMINB12, FOLATE, FERRITIN, TIBC, IRON, RETICCTPCT in the last 72 hours. Microbiology Recent Results (from the past 240 hour(s))  Culture, body fluid-bottle     Status: None (Preliminary result)   Collection Time: 08/06/17  8:23 PM  Result Value Ref Range Status   Specimen  Description FLUID PERITONEAL  Final   Special Requests   Final    BOTTLES DRAWN AEROBIC ONLY Blood Culture results may not be optimal due to an excessive volume of blood received in culture bottles   Culture   Final    NO GROWTH 4 DAYS Performed at [facility], [place] [address], [place], [place] [address]    Report Status PENDING  Incomplete  Gram stain     Status: None   Collection Time: 08/06/17  8:23 PM  Result Value Ref Range Status   Specimen Description FLUID PERITONEAL  Final   Special Requests NONE  Final   Gram Stain   Final    FEW WBC PRESENT,BOTH PMN AND MONONUCLEAR NO ORGANISMS SEEN RESULT CALLED TO, READ BACK BY AND VERIFIED WITH: B.JESSEE,RN 0145 08/07/17 M.CAMPBELL Performed at [facility], [place] [address], [place], [place] [address]    Report Status 08/08/2017 FINAL  Final  Gram stain     Status: None   Collection Time: 08/06/17 11:36 PM  Result Value Ref Range Status   Specimen Description PERITONEAL CAVITY  Final   Special Requests Normal  Final   Gram Stain   Final    CYTOSPIN SMEAR WBC PRESENT,BOTH PMN AND MONONUCLEAR NO ORGANISMS SEEN RESULT CALLED TO, READ BACK BY AND VERIFIED WITH: A MARQUEZ,RN @0059  08/07/17 MKELLY    Report Status 08/07/2017 FINAL  Final  Culture, blood (Routine x 2)     Status: None (Preliminary result)   Collection Time: 08/07/17  6:07 PM  Result Value Ref Range Status   Specimen Description BLOOD RIGHT HAND  Final   Special Requests   Final    BOTTLES DRAWN AEROBIC ONLY Blood Culture adequate volume   Culture   Final    NO GROWTH 3 DAYS Performed at [facility], [place] [address] [place], [place] [address]    Report Status PENDING  Incomplete    Studies:  Ir Replc Duoden/jejuno Tube Percut W/fluoro  Result Date: 08/10/2017 INDICATION: History of gastric cancer with chronic surgically placed jejunostomy catheter. The jejunostomy catheter is currently leaking. Please perform fluoroscopic guided injection and  potential exchange Recurrent malignant ascites. Please perform ultrasound-guided paracentesis. EXAM: FLUOROSCOPIC GUIDED REPLACEMENT OF JEJUNOSTOMY TUBE COMPARISON:  CT abdomen pelvis - 08/06/2017 MEDICATIONS: None CONTRAST:  50 mL ISOVUE-300  IOPAMIDOL (ISOVUE-300) INJECTION 61% - administered via the jejunostomy catheter FLUOROSCOPY TIME:  7 minutes 42 seconds (50.5 mGy) COMPLICATIONS: None immediate. PROCEDURE: Informed written consent was obtained from the patient (via the use of a medical translator) after a discussion of the risks, benefits and alternatives to treatment. Questions regarding the procedure were encouraged and answered. A timeout was performed prior to the initiation of the procedure. Ultrasound scanning demonstrated small to moderate volume recurrent intra-abdominal ascites. The skin overlying the right mid lateral abdomen was prepped and draped in usual sterile fashion. At the overlying soft tissues were anesthetized with 1% lidocaine with epinephrine, a Yueh sheath needle was advanced into the peritoneal space. Ultrasound image was saved for procedural documentation purposes. Next, paracentesis was performed yielding 1.4 L of serous fluid. The Yueh sheath catheter was removed and a dressing was placed. Attention was now paid towards the leaking jejunostomy catheter. External portion of the existing jejunostomy catheter as well as the surrounding skin were prepped and draped in the usual sterile fashion, and a sterile drape was applied covering the operative field. Maximum barrier sterile technique with sterile gowns and gloves were used for the procedure. A preprocedural spot fluoroscopic image demonstrates abrupt kinking of the mid aspect of the existing jejunostomy catheter. Contrast injection demonstrates peripheral antral opacification of the proximal aspect of the jejunostomy catheter (additional sideholes had been cut along the length of the jejunostomy catheter as is customary with  surgical placement). The external portion of jejunostomy catheter was cut and the catheter was cannulated with a stiff Glidewire which was advanced into the jejunum. A new 15 French balloon retention jejunostomy catheter was trimmed approximately and half. Next, the new shortened jejunostomy catheter was advanced over the Glidewire. The retention balloon was inflated and the balloon was cinched. Contrast was injected and several spot fluoroscopic images were obtained in various obliquities confirming intraluminal positioning. The patient tolerated both procedures well without immediate postprocedural complication. IMPRESSION: 1. Successful fluoroscopic guided placement of a new 18-French jejunostomy tube. The new jejunostomy tube is ready for immediate use. 2. Successful ultrasound-guided paracentesis yielding 1.4 L of serous fluid. Electronically Signed   By: Sandi Mariscal M.D.   On: 08/10/2017 16:47   Dg Abd 2 Views  Result Date: 08/10/2017 CLINICAL DATA:  Nausea and vomiting EXAM: ABDOMEN - 2 VIEW COMPARISON:  None. FINDINGS: There is retained enteric contrast in small bowel. Nonspecific air-fluid levels in small bowel are noted. There is no free intraperitoneal gas. A jejunostomy tube projects over the left hemiabdomen. IMPRESSION: No free intraperitoneal gas.  Nonobstructive bowel gas pattern. Electronically Signed   By: Marybelle Killings M.D.   On: 08/10/2017 20:06   Ir Paracentesis  Result Date: 08/10/2017 INDICATION: History of gastric cancer with chronic surgically placed jejunostomy catheter. The jejunostomy catheter is currently leaking. Please perform fluoroscopic guided injection and potential exchange Recurrent malignant ascites. Please perform ultrasound-guided paracentesis. EXAM: FLUOROSCOPIC GUIDED REPLACEMENT OF JEJUNOSTOMY TUBE COMPARISON:  CT abdomen pelvis - 08/06/2017 MEDICATIONS: None CONTRAST:  50 mL ISOVUE-300 IOPAMIDOL (ISOVUE-300) INJECTION 61% - administered via the jejunostomy catheter  FLUOROSCOPY TIME:  7 minutes 42 seconds (39.7 mGy) COMPLICATIONS: None immediate. PROCEDURE: Informed written consent was obtained from the patient (via the use of a medical translator) after a discussion of the risks, benefits and alternatives to treatment. Questions regarding the procedure were encouraged and answered. A timeout was performed prior to the initiation of the procedure. Ultrasound scanning demonstrated small to moderate volume recurrent intra-abdominal ascites. The skin overlying  the right mid lateral abdomen was prepped and draped in usual sterile fashion. At the overlying soft tissues were anesthetized with 1% lidocaine with epinephrine, a Yueh sheath needle was advanced into the peritoneal space. Ultrasound image was saved for procedural documentation purposes. Next, paracentesis was performed yielding 1.4 L of serous fluid. The Yueh sheath catheter was removed and a dressing was placed. Attention was now paid towards the leaking jejunostomy catheter. External portion of the existing jejunostomy catheter as well as the surrounding skin were prepped and draped in the usual sterile fashion, and a sterile drape was applied covering the operative field. Maximum barrier sterile technique with sterile gowns and gloves were used for the procedure. A preprocedural spot fluoroscopic image demonstrates abrupt kinking of the mid aspect of the existing jejunostomy catheter. Contrast injection demonstrates peripheral antral opacification of the proximal aspect of the jejunostomy catheter (additional sideholes had been cut along the length of the jejunostomy catheter as is customary with surgical placement). The external portion of jejunostomy catheter was cut and the catheter was cannulated with a stiff Glidewire which was advanced into the jejunum. A new 38 French balloon retention jejunostomy catheter was trimmed approximately and half. Next, the new shortened jejunostomy catheter was advanced over the  Glidewire. The retention balloon was inflated and the balloon was cinched. Contrast was injected and several spot fluoroscopic images were obtained in various obliquities confirming intraluminal positioning. The patient tolerated both procedures well without immediate postprocedural complication. IMPRESSION: 1. Successful fluoroscopic guided placement of a new 18-French jejunostomy tube. The new jejunostomy tube is ready for immediate use. 2. Successful ultrasound-guided paracentesis yielding 1.4 L of serous fluid. Electronically Signed   By: Sandi Mariscal M.D.   On: 08/10/2017 16:47    Assessment: 41 y.o. 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 refractory nausea, vomiting, fatigue, abdominal pain, secondary to #2 2.  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  hydronephrosis secondary to ascites 5.  severe protein calorie malnutrition 6. 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 We highly suspect she has SBO due to peritoneal carcinomatosis, although the recent CT scan did not show bowel dilatation. We suspect her bowel obstruction is diffuse, she is probably not a candidate for bypass surgery. Surgical team will see her again to weight in -IR plan to do a dye study, to see if the dye can pass her small bowel  -I discussed the option of TPN and immunotherapy. We discussed her high risk of severe infection from TPN, giving her bowel leakage and possible peritonitis. She is not a candidate for further chemotherapy. We discussed the response rate from immunotherapy is only 20%, that is probably the only treatment for at this point. She wants to try. -We discussed hospice, due to her severe malnutrition and bowel obstruction, lack of great treatment option, she is a candidate  for hospice. After long discussion, patient is not ready for hospice at this point. -We discussed DO NOT RESUSCITATE, DO NOT INTUBATE, she finally agreed -please send fluid culture, cells, protein from paracentesis today to rule out infection. If negative, consider starting TPN this weekend -please call my on call partner if needed for this weekend, I will be back on Monday  -Continue supportive care, her pain is better controlled with Sentinel patch, will continue. Her nausea  has improved. This is -I spoke with Dr. Grandville Silos this morning.    Truitt Merle, MD 08/12/2017  1:36 PM   I have seen the patient, examined her. I agree with the assessment and and plan and have edited the notes.   Truitt Merle  08/12/2017

## 2017-08-13 LAB — CBC WITH DIFFERENTIAL/PLATELET
Basophils Absolute: 0 10*3/uL (ref 0.0–0.1)
Basophils Relative: 0 %
Eosinophils Absolute: 0 10*3/uL (ref 0.0–0.7)
Eosinophils Relative: 0 %
HCT: 35.7 % — ABNORMAL LOW (ref 36.0–46.0)
HEMOGLOBIN: 11.7 g/dL — AB (ref 12.0–15.0)
LYMPHS ABS: 0.8 10*3/uL (ref 0.7–4.0)
Lymphocytes Relative: 9 %
MCH: 30.6 pg (ref 26.0–34.0)
MCHC: 32.8 g/dL (ref 30.0–36.0)
MCV: 93.5 fL (ref 78.0–100.0)
MONOS PCT: 14 %
Monocytes Absolute: 1.2 10*3/uL — ABNORMAL HIGH (ref 0.1–1.0)
Neutro Abs: 6.6 10*3/uL (ref 1.7–7.7)
Neutrophils Relative %: 77 %
Platelets: 488 10*3/uL — ABNORMAL HIGH (ref 150–400)
RBC: 3.82 MIL/uL — AB (ref 3.87–5.11)
RDW: 16.6 % — ABNORMAL HIGH (ref 11.5–15.5)
WBC: 8.6 10*3/uL (ref 4.0–10.5)

## 2017-08-13 LAB — GLUCOSE, CAPILLARY
GLUCOSE-CAPILLARY: 150 mg/dL — AB (ref 65–99)
Glucose-Capillary: 132 mg/dL — ABNORMAL HIGH (ref 65–99)
Glucose-Capillary: 148 mg/dL — ABNORMAL HIGH (ref 65–99)

## 2017-08-13 LAB — HEPATIC FUNCTION PANEL
ALBUMIN: 2.5 g/dL — AB (ref 3.5–5.0)
ALT: 17 U/L (ref 14–54)
AST: 34 U/L (ref 15–41)
Alkaline Phosphatase: 78 U/L (ref 38–126)
BILIRUBIN DIRECT: 0.2 mg/dL (ref 0.1–0.5)
Indirect Bilirubin: 0.6 mg/dL (ref 0.3–0.9)
Total Bilirubin: 0.8 mg/dL (ref 0.3–1.2)
Total Protein: 6.2 g/dL — ABNORMAL LOW (ref 6.5–8.1)

## 2017-08-13 LAB — BASIC METABOLIC PANEL
Anion gap: 10 (ref 5–15)
BUN: 11 mg/dL (ref 6–20)
CHLORIDE: 107 mmol/L (ref 101–111)
CO2: 28 mmol/L (ref 22–32)
Calcium: 8.6 mg/dL — ABNORMAL LOW (ref 8.9–10.3)
Creatinine, Ser: 0.42 mg/dL — ABNORMAL LOW (ref 0.44–1.00)
GFR calc Af Amer: >60 mL/min (ref >60)
GFR calc non Af Amer: >60 mL/min (ref >60)
GLUCOSE: 150 mg/dL — AB (ref 65–99)
POTASSIUM: 3.7 mmol/L (ref 3.5–5.1)
Sodium: 145 mmol/L (ref 135–145)

## 2017-08-13 LAB — PHOSPHORUS: PHOSPHORUS: 3.1 mg/dL (ref 2.5–4.6)

## 2017-08-13 LAB — CULTURE, BLOOD (ROUTINE X 2)
Culture: NO GROWTH
SPECIAL REQUESTS: ADEQUATE

## 2017-08-13 LAB — MAGNESIUM: MAGNESIUM: 1.9 mg/dL (ref 1.7–2.4)

## 2017-08-13 MED ORDER — FAT EMULSION 20 % IV EMUL
240.0000 mL | INTRAVENOUS | Status: AC
  Administered 2017-08-13: 240 mL via INTRAVENOUS
  Filled 2017-08-13: qty 250

## 2017-08-13 MED ORDER — TRACE MINERALS CR-CU-MN-SE-ZN 10-1000-500-60 MCG/ML IV SOLN
INTRAVENOUS | Status: AC
  Administered 2017-08-13: 20:00:00 via INTRAVENOUS
  Filled 2017-08-13: qty 660

## 2017-08-13 MED ORDER — INSULIN ASPART 100 UNIT/ML ~~LOC~~ SOLN
0.0000 [IU] | Freq: Four times a day (QID) | SUBCUTANEOUS | Status: DC
  Administered 2017-08-13 – 2017-08-15 (×6): 1 [IU] via SUBCUTANEOUS
  Administered 2017-08-15: 2 [IU] via SUBCUTANEOUS
  Administered 2017-08-15 – 2017-08-16 (×2): 1 [IU] via SUBCUTANEOUS
  Administered 2017-08-16: 2 [IU] via SUBCUTANEOUS

## 2017-08-13 MED ORDER — DEXTROSE-NACL 5-0.45 % IV SOLN
INTRAVENOUS | Status: AC
  Administered 2017-08-13: 21:00:00 via INTRAVENOUS

## 2017-08-13 NOTE — Progress Notes (Signed)
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 Per record review, pt has completed three cycles of chemo with FOLFOX. Pt was just seen in ED three days PTA for same concern, at that time she had Ct abd done which was notable for hydronephrosis due to mass effect from progressive malignant ascites, ? omental and pelvic peritoneal metastatic disease, ? Cervical mass or metastatic disease. Patient had 3 L paracentesis done, felt better and was discharged home.    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 J-tube study done negative for any leakage. Patient subsequently underwent exchange of J-tube per IR 08/10/2017 per discussions with Dr.Feng  and Dr. Barry Dienes. Oncology holding off on Pleurx placement now due to increased risk for peritonitis. Per oncology patient will likely need weekly therapeutic paracentesis. Patient with some worsening abdominal pain, abdomen more firm and more distended with further nausea and emesis despite scheduled Compazine. PEG tube site with some feculent drainage around insertion site. IR and general surgery to reassess that site. IR assessed J-tube under fluoroscopy with concerns for small bowel obstruction and a such J-tube was placed to Foley  catheter. Some clinical improvement. Continue scheduled Compazine, current pain regimen. Continue Duragesic patch. Oncology following and appreciate input and recommendations.  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 Patient status post J-tube exchange per IR as noted in problem #1. Patient status post paracenteses per interventional radiology 08/10/2017 uterine 1.4 L of serous fluid. Patient has been started on a fentanyl patch for pain management. Nausea and emesis with some improvement per patient this morning. PEG tube site with less drainage with a feculent odor. Per hematology/oncology IR and general surgery to assess PEG site. Patient with worsening significant abdominal distention and firmness and now with further nausea and vomiting with drainage noted around PEG tube site on 08/12/2017. Concern for possible reaccumulation of malignant ascites. Hematology/oncology holding off on pleurx catheter placement due to risk of peritonitis. Patient will likely require weekly therapeutic paracentesis done per oncology who will arrange for them. Continue supportive care. IR assessed J-tube and those concern for small bowel obstruction and as such J-tube placed to Foley catheter. Will start patient on TPN per oncology recommendations. Oncology following and appreciate input and recommendations.  #3 severe protein calorie malnutrition/nutrition Secondary to problem #2. J-tube has been exchange per IR. Patient currently with less nausea and emesis after being started on scheduled antiemetics. Patient with complaints of abdominal pain again today. Patient's abdomen somewhat distended and more firm and tighter today. Osmolite has been discontinued due to issues surrounding PEG tube. Hematology/oncology, Dr. Burr Medico to discuss with patient concerning possibility of starting TPN for nutrition due to issues surrounding PEG tube site  and metastatic malignant gastric cancer.  Dr. Ernestina Penna note will start patient on TPN per pharmacy.  #4 hydronephrosis secondary to ascites Patient status post paracenteses with 1.4 L of serous fluid removed 08/10/2017. Renal function stable. Urine output not recorded. Renal function stable. Follow.  #5 anemia of neoplastic disease/iron deficiency anemia   patient with no overt bleeding. Hemoglobin stable at 11.7. Follow H&H.   #6 ?? J-tube malfunction Per nursing patient noted to have significant drainage noted around insertion site of PEG tube and skin. Per nursing gauze dressing has been significantly soaked. Osmolite tube feeds have been on hold and not resumed. Patient was assessed by IR and underwent G-tube injection to rule out obstruction or fistula formation 08/12/2017. It was noted that jejunostomy catheter was appropriately positioned and functioning, findings worrisome for small bowel obstruction with reflux of contrast along catheter tract to opacify a markedly dilated loop of more proximal small bowel favored to represent duodenal. Given the change jejunostomy catheter was placed to Foley catheter. Will start patient on TPN for nutrition. Per Dr. Burr Medico, hematology/oncology general surgery and IR have been notified to reassess the patient.   DVT prophylaxis: Lovenox Code Status: DO NOT RESUSCITATE per Dr. Ernestina Penna note from 08/12/2017 Family Communication: Updated patient and family at bedside.  Disposition Plan: To be determined.   Consultants:   Palliative care Dr.Anwar 08/09/2017  Gen. surgery Dr.Gerkin 08/09/2017  Oncology Dr. Burr Medico 08/08/2017  Procedures:   CT abdomen and pelvis 08/06/2017  Abdominal films 08/07/2017  Fluoroscopic guided replacement of jejunostomy tube 08/10/2017 per Dr. Pascal Lux  Ultrasound-guided paracentesis yielding 1.4 L of serous fluid per Dr. Pascal Lux 08/10/2017  DG abdomen PEG tube location 08/09/2017  Fluoroscopic guided injection of existing  jejunostomy catheter per Dr. Pascal Lux 08/12/2017  Antimicrobials:   None   Subjective: Patient sleeping easily arousable. States some improvement with nausea. States since states pain is somewhat fairly controlled.   Objective: Vitals:   08/12/17 1751 08/13/17 0052 08/13/17 0538 08/13/17 0828  BP: (!) 164/109 (!) 155/116 (!) 148/109   Pulse: (!) 58 (!) 116 94   Resp: 20 20 16    Temp: 97.6 F (36.4 C) 98 F (36.7 C) 97.6 F (36.4 C)   TempSrc: Oral  Oral   SpO2: 99% 99% 100%   Weight:    49.4 kg (109 lb)  Height:        Intake/Output Summary (Last 24 hours) at 08/13/17 1229 Last data filed at 08/13/17 1207  Gross per 24 hour  Intake          1521.75 ml  Output              100 ml  Net          1421.75 ml   Filed Weights   08/08/17 0826 08/12/17 0500 08/13/17 0828  Weight: 47.3 kg (104 lb 4.4 oz) 50.3 kg (111 lb) 49.4 kg (109 lb)    Examination:  General exam: Sleeping.  Respiratory system: Clear to auscultation anterior lung fields. No wheezes, no crackles, no rhonchi.  Cardiovascular system: Regular rate rhythm no murmurs rubs or gallops. No pedal edema.  Gastrointestinal system: Abdomen is Mildly distended, firm, some tenderness to palpation, positive bowel sounds. Feeding tube site with gauze. Central nervous system: Alert and oriented. No focal neurological deficits. Extremities: Symmetric 5 x 5 power. Skin: No rashes, lesions or ulcers Psychiatry: Judgement and insight appear normal. Mood & affect appropriate.     Data Reviewed: I have personally reviewed following labs and imaging studies  CBC:  Recent  Labs Lab 08/06/17 1613 08/07/17 1928 08/08/17 0630 08/09/17 0510 08/10/17 0500 08/11/17 0600 08/13/17 0500  WBC 7.3 8.5 4.8 5.3 4.7 6.1 8.6  NEUTROABS 4.8 7.0  --   --   --   --  6.6  HGB 12.6 11.0* 10.4* 10.4* 10.5* 10.8* 11.7*  HCT 38.7 34.2* 32.6* 32.6* 33.5* 33.9* 35.7*  MCV 86.8 87.0 85.3 87.9 87.9 89.2 93.5  PLT 485* 428* 388 413* 462* 437*  409*   Basic Metabolic Panel:  Recent Labs Lab 08/06/17 1613  08/09/17 0510 08/10/17 0500 08/11/17 0600 08/12/17 0500 08/13/17 0500  NA 138  < > 142 143 144 146* 145  K 4.6  < > 4.1 3.7 3.6 3.8 3.7  CL 100*  < > 106 106 106 108 107  CO2 28  < > 27 25 25 26 28   GLUCOSE 131*  < > 94 99 111* 103* 150*  BUN 13  < > 13 15 15 15 11   CREATININE 0.57  < > 0.52 0.54 0.52 0.51 0.42*  CALCIUM 8.8*  < > 8.3* 8.5* 8.2* 8.5* 8.6*  MG 2.2  --   --  2.0  --   --   --   < > = values in this interval not displayed. GFR: Estimated Creatinine Clearance: 71.4 mL/min (A) (by C-G formula based on SCr of 0.42 mg/dL (L)). Liver Function Tests:  Recent Labs Lab 08/06/17 1613 08/07/17 1928 08/09/17 0510  AST 48* 31 23  ALT 33 25 19  ALKPHOS 78 62 59  BILITOT 0.2* 0.4 0.6  PROT 7.1 6.0* 5.9*  ALBUMIN 2.6* 2.2* 2.2*    Recent Labs Lab 08/06/17 1613 08/07/17 1928 08/09/17 0510  LIPASE 24 211* 18    Recent Labs Lab 08/07/17 1928  AMMONIA 15   Coagulation Profile:  Recent Labs Lab 08/07/17 1928  INR 1.05   Cardiac Enzymes: No results for input(s): CKTOTAL, CKMB, CKMBINDEX, TROPONINI in the last 168 hours. BNP (last 3 results) No results for input(s): PROBNP in the last 8760 hours. HbA1C: No results for input(s): HGBA1C in the last 72 hours. CBG:  Recent Labs Lab 08/11/17 2034 08/12/17 0426 08/13/17 0811  GLUCAP 110* 101* 150*   Lipid Profile: No results for input(s): CHOL, HDL, LDLCALC, TRIG, CHOLHDL, LDLDIRECT in the last 72 hours. Thyroid Function Tests: No results for input(s): TSH, T4TOTAL, FREET4, T3FREE, THYROIDAB in the last 72 hours. Anemia Panel: No results for input(s): VITAMINB12, FOLATE, FERRITIN, TIBC, IRON, RETICCTPCT in the last 72 hours. Sepsis Labs:  Recent Labs Lab 08/07/17 1944  LATICACIDVEN 0.74    Recent Results (from the past 240 hour(s))  Culture, body fluid-bottle     Status: None   Collection Time: 08/06/17  8:23 PM  Result Value Ref  Range Status   Specimen Description FLUID PERITONEAL  Final   Special Requests   Final    BOTTLES DRAWN AEROBIC ONLY Blood Culture results may not be optimal due to an excessive volume of blood received in culture bottles   Culture   Final    NO GROWTH 5 DAYS Performed at [facility], [place] [address] [place], [place] [address]    Report Status 08/12/2017 FINAL  Final  Gram stain     Status: None   Collection Time: 08/06/17  8:23 PM  Result Value Ref Range Status   Specimen Description FLUID PERITONEAL  Final   Special Requests NONE  Final   Gram Stain   Final    FEW  WBC PRESENT,BOTH PMN AND MONONUCLEAR NO ORGANISMS SEEN RESULT CALLED TO, READ BACK BY AND VERIFIED WITH: B.JESSEE,RN 0145 08/07/17 M.CAMPBELL Performed at [facility], [place] [address] [place], [place] [address]    Report Status 08/08/2017 FINAL  Final  Gram stain     Status: None   Collection Time: 08/06/17 11:36 PM  Result Value Ref Range Status   Specimen Description PERITONEAL CAVITY  Final   Special Requests Normal  Final   Gram Stain   Final    CYTOSPIN SMEAR WBC PRESENT,BOTH PMN AND MONONUCLEAR NO ORGANISMS SEEN RESULT CALLED TO, READ BACK BY AND VERIFIED WITH: A MARQUEZ,RN @0059  08/07/17 MKELLY    Report Status 08/07/2017 FINAL  Final  Culture, blood (Routine x 2)     Status: None (Preliminary result)   Collection Time: 08/07/17  6:07 PM  Result Value Ref Range Status   Specimen Description BLOOD RIGHT HAND  Final   Special Requests   Final    BOTTLES DRAWN AEROBIC ONLY Blood Culture adequate volume   Culture   Final    NO GROWTH 4 DAYS Performed at [facility], [place] [address] [place], [place] [address]    Report Status PENDING  Incomplete         Radiology Studies: Ir Cm Inj Any Colonic Tube W/fluoro  Result Date: 08/12/2017 INDICATION: History of gastric cancer with chronic surgically placed jejunostomy catheter, now with concern for persistent jejunostomy leaking. Note,  patient recently underwent fluoroscopic guided jejunostomy catheter exchange on 08/10/2017. EXAM: FLUOROSCOPIC GUIDED INJECTION OF EXISTING JEJUNOSTOMY CATHETER COMPARISON:  Fluoroscopic guided jejunostomy catheter exchange - 08/10/2017; CT abdomen pelvis - 08/07/2027 MEDICATIONS: None. CONTRAST:  50 cc Isovue-300 - administered into the jejunum. FLUOROSCOPY TIME:  3 minutes (49 mGy). COMPLICATIONS: None immediate. PROCEDURE: The patient was positioned supine on the fluoroscopy table. Preprocedural spot fluoroscopic image was obtained demonstrating unchanged positioning of the recently exchange jejunostomy catheter. Contrast injection demonstrates opacification of the jejunum. Despite keeping the patient on the fluoroscopy table for prolonged period of time, the injected contrast did not past much beyond the distal end of the jejunostomy catheter. Rather, contrast refluxed retrograde along the jejunostomy tract, eventually opacifying a markedly dilated loop of more proximal small bowel, suspected to represent the descending portion of the duodenum (confirmed with positioning of the patient left lateral decubitus). IMPRESSION: 1. Appropriately positioned and functioning jejunostomy catheter. No exchange performed. 2. Findings worrisome for small bowel obstruction with reflux of contrast along the catheter tract to opacify a markedly dilated loop of more proximal small bowel favored to represent the duodenum. This represents an interval change compared to the 08/06/2017 abdominal CT and further evaluation with repeat CT scan could be performed as indicated. PLAN: - Given above, the patient's jejunostomy catheter was placed to a Foley catheter however may be placed to wall suction as indicated. - Would consider converting the patient to TPN versus hospice/palliation as indicated. Electronically Signed   By: Sandi Mariscal M.D.   On: 08/12/2017 17:32        Scheduled Meds: . enoxaparin (LOVENOX) injection  40 mg  Subcutaneous QHS  . fentaNYL  12.5 mcg Transdermal Q72H  . lidocaine-prilocaine   Topical Once  . metoprolol tartrate  2.5 mg Intravenous Q8H  . prochlorperazine  10 mg Intravenous Q6H   Continuous Infusions: . dextrose 5 % and 0.45% NaCl 75 mL/hr at 08/13/17 1156  . ondansetron (ZOFRAN) IV Stopped (08/13/17 0338)     LOS: 5 days  Time spent: 40 minutes    Anessia Oakland, MD [facility] Pager [phone]  If 7PM-7AM, please contact night-coverage [email or web address] Password [email or web address] 08/13/2017, 12:29 PM

## 2017-08-13 NOTE — Progress Notes (Signed)
[facility] NOTE   Pharmacy Consult for TPN Indication: metastatic gastric cancer, ? SBO  Patient Measurements: Height: 5' 4.75" (164.5 cm) Weight: 109 lb (49.4 kg) IBW/kg (Calculated) : 56.43 TPN AdjBW (KG): 49.9 Body mass index is 18.28 kg/m.  Insulin Requirements: none  Current Nutrition: clear liquid diet ordered. Tube feeds currently off   IVF: D5 1/2NS at 75 ml/hr  Central access: implanted port TPN start date: 08/13/2017  ASSESSMENT                                                                                                          HPI:   60 y/oF with PMH of metastatic adenocarcinoma of the stomach, with feeding tube,  admitted on 9/30 for persistent nausea, vomiting, and abdominal pain. Patient underwent J-tube exchange and paracentesis per IR on 10/3. Since exchange, feculent material draining at J-tube insertion site and tube feeds stopped. 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 Pharmacy asked to start TPN.   Significant events:   Today:    Glucose - 150 on BMET this AM. Goal < 150.   Electrolytes - all WNL, including corrected Ca  Renal - SCr low, 0.42  LFTs - AST/ALT, Alk Phos, Tbili WNL  TGs - ordered for tomorrow  Prealbumin - ordered for tomorrow  NUTRITIONAL GOALS                                                                                             RD recs (10/4): 1500-1700 Kcal/day, 70-80g protein/day  Clinimix 5/15 at a goal rate of 65 ml/hr + 20% fat emulsion at 20 ml/hr x 12h a day to provide: 78g/day protein, 1588 Kcal/day.  PLAN                                                                                                                         At 1800 today:  Start Clinimix E 5/15 at 30 ml/hr.  20% fat emulsion at 20 ml/hr x 12h.  Plan to advance as tolerated to the goal rate.  TPN to contain standard multivitamins and trace elements.  Reduce IVF to 79  ml/hr.  Add sensitive SSI q6h.    TPN lab panels on Mondays & Thursdays.  CMET, Mag, Phos in AM.   F/u daily.   Lindell Spar, PharmD, BCPS Pager: [phone] 08/13/2017 5:15 PM

## 2017-08-14 ENCOUNTER — Inpatient Hospital Stay (HOSPITAL_COMMUNITY): Payer: Medicaid Other

## 2017-08-14 DIAGNOSIS — K56609 Unspecified intestinal obstruction, unspecified as to partial versus complete obstruction: Secondary | ICD-10-CM | POA: Clinically undetermined

## 2017-08-14 DIAGNOSIS — R140 Abdominal distension (gaseous): Secondary | ICD-10-CM

## 2017-08-14 LAB — GLUCOSE, CAPILLARY
GLUCOSE-CAPILLARY: 127 mg/dL — AB (ref 65–99)
Glucose-Capillary: 160 mg/dL — ABNORMAL HIGH (ref 65–99)
Glucose-Capillary: 140 mg/dL — ABNORMAL HIGH (ref 65–99)

## 2017-08-14 LAB — COMPREHENSIVE METABOLIC PANEL
ALK PHOS: 78 U/L (ref 38–126)
ALT: 15 U/L (ref 14–54)
AST: 31 U/L (ref 15–41)
Albumin: 2.5 g/dL — ABNORMAL LOW (ref 3.5–5.0)
Anion gap: 11 (ref 5–15)
BUN: 11 mg/dL (ref 6–20)
CHLORIDE: 104 mmol/L (ref 101–111)
CO2: 25 mmol/L (ref 22–32)
CREATININE: 0.44 mg/dL (ref 0.44–1.00)
Calcium: 8.3 mg/dL — ABNORMAL LOW (ref 8.9–10.3)
GFR calc Af Amer: >60 mL/min (ref >60)
Glucose, Bld: 166 mg/dL — ABNORMAL HIGH (ref 65–99)
Potassium: 3.5 mmol/L (ref 3.5–5.1)
Sodium: 140 mmol/L (ref 135–145)
Total Bilirubin: 0.3 mg/dL (ref 0.3–1.2)
Total Protein: 6.3 g/dL — ABNORMAL LOW (ref 6.5–8.1)

## 2017-08-14 LAB — DIFFERENTIAL
Basophils Absolute: 0 10*3/uL (ref 0.0–0.1)
Basophils Relative: 0 %
Eosinophils Absolute: 0 10*3/uL (ref 0.0–0.7)
Eosinophils Relative: 0 %
LYMPHS PCT: 7 %
Lymphs Abs: 0.7 10*3/uL (ref 0.7–4.0)
MONO ABS: 1.3 10*3/uL — AB (ref 0.1–1.0)
MONOS PCT: 13 %
NEUTROS ABS: 8.1 10*3/uL — AB (ref 1.7–7.7)
Neutrophils Relative %: 80 %

## 2017-08-14 LAB — TRIGLYCERIDES: Triglycerides: 375 mg/dL — ABNORMAL HIGH (ref <150)

## 2017-08-14 LAB — MAGNESIUM: Magnesium: 2 mg/dL (ref 1.7–2.4)

## 2017-08-14 LAB — CBC
HEMATOCRIT: 33.7 % — AB (ref 36.0–46.0)
Hemoglobin: 11.9 g/dL — ABNORMAL LOW (ref 12.0–15.0)
MCH: 33.6 pg (ref 26.0–34.0)
MCHC: 35.3 g/dL (ref 30.0–36.0)
MCV: 95.2 fL (ref 78.0–100.0)
Platelets: 419 10*3/uL — ABNORMAL HIGH (ref 150–400)
RBC: 3.54 MIL/uL — AB (ref 3.87–5.11)
RDW: 18.5 % — ABNORMAL HIGH (ref 11.5–15.5)
WBC: 10.2 10*3/uL (ref 4.0–10.5)

## 2017-08-14 LAB — PHOSPHORUS: Phosphorus: 3.3 mg/dL (ref 2.5–4.6)

## 2017-08-14 LAB — PREALBUMIN: PREALBUMIN: 8.6 mg/dL — AB (ref 18–38)

## 2017-08-14 MED ORDER — POTASSIUM CHLORIDE 10 MEQ/100ML IV SOLN
10.0000 meq | INTRAVENOUS | Status: AC
  Administered 2017-08-14 (×4): 10 meq via INTRAVENOUS
  Filled 2017-08-14 (×4): qty 100

## 2017-08-14 MED ORDER — POTASSIUM CHLORIDE 10 MEQ/50ML IV SOLN
10.0000 meq | INTRAVENOUS | Status: DC
  Filled 2017-08-14 (×4): qty 50

## 2017-08-14 MED ORDER — TRACE MINERALS CR-CU-MN-SE-ZN 10-1000-500-60 MCG/ML IV SOLN
INTRAVENOUS | Status: AC
  Administered 2017-08-14: 19:00:00 via INTRAVENOUS
  Filled 2017-08-14: qty 1560

## 2017-08-14 MED ORDER — ALBUMIN HUMAN 25 % IV SOLN
50.0000 g | Freq: Once | INTRAVENOUS | Status: AC
Start: 2017-08-15 — End: 2017-08-15
  Administered 2017-08-15: 50 g via INTRAVENOUS
  Filled 2017-08-14: qty 200

## 2017-08-14 MED ORDER — SODIUM CHLORIDE 0.45 % IV SOLN
INTRAVENOUS | Status: DC
  Administered 2017-08-14: 18:00:00 via INTRAVENOUS

## 2017-08-14 MED ORDER — FAT EMULSION 20 % IV EMUL
240.0000 mL | INTRAVENOUS | Status: AC
  Administered 2017-08-14: 240 mL via INTRAVENOUS
  Filled 2017-08-14: qty 250

## 2017-08-14 NOTE — Progress Notes (Signed)
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 Per record review, pt has completed three cycles of chemo with FOLFOX. Pt was just seen in ED three days PTA for same concern, at that time she had Ct abd done which was notable for hydronephrosis due to mass effect from progressive malignant ascites, ? omental and pelvic peritoneal metastatic disease, ? Cervical mass or metastatic disease. Patient had 3 L paracentesis done, felt better and was discharged home.    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 J-tube study done negative for any leakage. Patient subsequently underwent exchange of J-tube per IR 08/10/2017 per discussions with Dr.Feng  and Dr. Barry Dienes. Oncology holding off on Pleurx placement now due to increased risk for peritonitis.  Per oncology patient will likely need weekly therapeutic paracentesis. Patient with worsening abdominal pain, abdomen more firm and more distended with further nausea and emesis despite scheduled Compazine. PEG tube site with some feculent drainage around insertion site. IR and general surgery to reassess that site.  IR assessed J-tube under fluoroscopy with  concerns for small bowel obstruction and as such J-tube was placed to Foley catheter. Continue scheduled Compazine, current pain regimen. Continue Duragesic patch. Will check abdominal films to follow-up on presumed small bowel obstruction. Oncology following and appreciate input and recommendations.  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 Patient status post J-tube exchange per IR as noted in problem #1. Patient status post paracenteses per interventional radiology 08/10/2017 uterine 1.4 L of serous fluid. Patient has been started on a fentanyl patch for pain management.  Patient still nauseous with some emesis. PEG tube site with less drainage with a feculent odor a few days ago and currently now as a foley to gravity.. Per hematology/oncology IR and general surgery to assess PEG site. Patient with worsening significant abdominal distention and firmness and now with further nausea and vomiting with drainage noted around PEG tube site on 08/12/2017.  Concern for possible reaccumulation of malignant ascites. Hematology/oncology holding off on pleurx catheter placement due to risk of peritonitis. Patient will likely require weekly therapeutic paracentesis done per oncology who will arrange for them. Continue supportive care. IR assessed J-tube and there was concern for small bowel obstruction and as such J-tube placed to Foley catheter. TPN started 08/13/2017 per oncology recommendations. Oncology following and appreciate input and recommendations. Will repeat abdominal ultrasound and abdominal films in the morning and if further reaccumulation of ascites will need a therapeutic paracentesis.  #3 severe protein calorie malnutrition/nutrition Secondary to problem #2. J-tube has been exchange per IR. Patient currently with nausea and emesis after being started on scheduled antiemetics. Patient with complaints of abdominal  pain  patient noted to have some abdominal distention with firmness and tightness. Osmolite has been discontinued due to issues surrounding PEG tube. Hematology/oncology, Dr. Burr Medico to discuss with patient concerning possibility of starting TPN for nutrition due to issues surrounding PEG tube site and metastatic malignant gastric cancer. TPN started 08/13/2017.  #4 hydronephrosis secondary to ascites Patient status post paracenteses with 1.4 L of serous fluid removed 08/10/2017. Renal function stable. Urine output not recorded. Renal function stable. Abdomen distended firm and tight. Will check an abdominal ultrasound to rule out reaccumulation of ascites. Follow.  #5 anemia of neoplastic disease/iron deficiency anemia   patient with no overt bleeding. Hemoglobin stable at 11.9. Follow H&H.   #6 ?? J-tube malfunction Per nursing patient noted to have significant drainage noted around insertion site of PEG tube and skin. Per nursing gauze dressing has been significantly soaked. Osmolite tube feeds have been on hold and not resumed.  Patient was assessed by IR and underwent G-tube injection to rule out obstruction or fistula formation 08/12/2017. It was noted that jejunostomy catheter was appropriately positioned and functioning, findings worrisome for small bowel obstruction with reflux of contrast along catheter tract to opacify a markedly dilated loop of more proximal small bowel favored to represent duodenal. Given the change jejunostomy catheter was placed to Foley catheter. TPN for nutrition started on 08/13/2017. Per Dr. Burr Medico, hematology/oncology general surgery and IR have been notified to reassess the patient.   DVT prophylaxis: Lovenox Code Status: DO NOT RESUSCITATE per Dr. Ernestina Penna note from 08/12/2017 Family Communication: Updated patient and family at bedside.  Disposition Plan: To be determined.   Consultants:   Palliative care Dr.Anwar 08/09/2017  Gen. surgery Dr.Gerkin  08/09/2017  Oncology Dr. Burr Medico 08/08/2017  Procedures:   CT abdomen and pelvis 08/06/2017  Abdominal films 08/07/2017  Fluoroscopic guided replacement of jejunostomy tube 08/10/2017 per Dr. Pascal Lux  Ultrasound-guided paracentesis yielding 1.4 L of serous fluid per Dr. Pascal Lux 08/10/2017  DG abdomen PEG tube location 08/09/2017  Fluoroscopic guided injection of existing jejunostomy catheter per Dr. Pascal Lux 08/12/2017  Antimicrobials:   None   Subjective: Patient with nausea and emesis. Patient states some improvement with abdominal pain. Patient asking for ice water.   Objective: Vitals:   08/13/17 1418 08/13/17 2226 08/14/17 0620 08/14/17 1346  BP:  (!) 144/112 (!) 148/106 (!) 136/99  Pulse: (!) 105 (!) 120 (!) 117 (!) 110  Resp:  18 18 16   Temp:  97.6 F (36.4 C) 97.8 F (36.6 C) 98.3 F (36.8 C)  TempSrc:  Oral Oral Oral  SpO2:  99% 98% 99%  Weight:    49 kg (108 lb)  Height:        Intake/Output Summary (Last 24 hours) at 08/14/17 1656 Last data filed at 08/14/17 1532  Gross per 24 hour  Intake           1207.5 ml  Output              600 ml  Net            607.5 ml   Filed Weights   08/12/17 0500 08/13/17 0828 08/14/17 1346  Weight: 50.3 kg (111 lb) 49.4 kg (109 lb) 49 kg (108 lb)    Examination:  General exam: NAD. Cachetic. Frail. Respiratory system: Clear to auscultation anterior lung fields. No wheezes, no crackles, no rhonchi.  Cardiovascular system: Tachycardia, no murmurs rubs or gallops. No pedal edema.  Gastrointestinal system: Abdomen is distended, firm, some tenderness to palpation, hypoactive bowel  sounds. Feeding tube with Foley with feculent output.  Central nervous system: Alert and oriented. No focal neurological deficits. Extremities: Symmetric 5 x 5 power. Skin: No rashes, lesions or ulcers Psychiatry: Judgement and insight appear normal. Mood & affect appropriate.     Data Reviewed: I have personally reviewed following labs and  imaging studies  CBC:  Recent Labs Lab 08/07/17 1928  08/09/17 0510 08/10/17 0500 08/11/17 0600 08/13/17 0500 08/14/17 0614  WBC 8.5  < > 5.3 4.7 6.1 8.6 10.2  NEUTROABS 7.0  --   --   --   --  6.6 8.1*  HGB 11.0*  < > 10.4* 10.5* 10.8* 11.7* 11.9*  HCT 34.2*  < > 32.6* 33.5* 33.9* 35.7* 33.7*  MCV 87.0  < > 87.9 87.9 89.2 93.5 95.2  PLT 428*  < > 413* 462* 437* 488* 419*  < > = values in this interval not displayed. Basic Metabolic Panel:  Recent Labs Lab 08/10/17 0500 08/11/17 0600 08/12/17 0500 08/13/17 0500 08/13/17 1610 08/14/17 0614  NA 143 144 146* 145  --  140  K 3.7 3.6 3.8 3.7  --  3.5  CL 106 106 108 107  --  104  CO2 25 25 26 28   --  25  GLUCOSE 99 111* 103* 150*  --  166*  BUN 15 15 15 11   --  11  CREATININE 0.54 0.52 0.51 0.42*  --  0.44  CALCIUM 8.5* 8.2* 8.5* 8.6*  --  8.3*  MG 2.0  --   --   --  1.9 2.0  PHOS  --   --   --   --  3.1 3.3   GFR: Estimated Creatinine Clearance: 70.9 mL/min (by C-G formula based on SCr of 0.44 mg/dL). Liver Function Tests:  Recent Labs Lab 08/07/17 1928 08/09/17 0510 08/13/17 1610 08/14/17 0614  AST 31 23 34 31  ALT 25 19 17 15   ALKPHOS 62 59 78 78  BILITOT 0.4 0.6 0.8 0.3  PROT 6.0* 5.9* 6.2* 6.3*  ALBUMIN 2.2* 2.2* 2.5* 2.5*    Recent Labs Lab 08/07/17 1928 08/09/17 0510  LIPASE 211* 18    Recent Labs Lab 08/07/17 1928  AMMONIA 15   Coagulation Profile:  Recent Labs Lab 08/07/17 1928  INR 1.05   Cardiac Enzymes: No results for input(s): CKTOTAL, CKMB, CKMBINDEX, TROPONINI in the last 168 hours. BNP (last 3 results) No results for input(s): PROBNP in the last 8760 hours. HbA1C: No results for input(s): HGBA1C in the last 72 hours. CBG:  Recent Labs Lab 08/13/17 0811 08/13/17 1721 08/13/17 2131 08/14/17 0604 08/14/17 1234  GLUCAP 150* 132* 148* 160* 140*   Lipid Profile:  Recent Labs  08/14/17 0614  TRIG 375*   Thyroid Function Tests: No results for input(s): TSH,  T4TOTAL, FREET4, T3FREE, THYROIDAB in the last 72 hours. Anemia Panel: No results for input(s): VITAMINB12, FOLATE, FERRITIN, TIBC, IRON, RETICCTPCT in the last 72 hours. Sepsis Labs:  Recent Labs Lab 08/07/17 1944  LATICACIDVEN 0.74    Recent Results (from the past 240 hour(s))  Culture, body fluid-bottle     Status: None   Collection Time: 08/06/17  8:23 PM  Result Value Ref Range Status   Specimen Description FLUID PERITONEAL  Final   Special Requests   Final    BOTTLES DRAWN AEROBIC ONLY Blood Culture results may not be optimal due to an excessive volume of blood received in culture bottles   Culture   Final  NO GROWTH 5 DAYS Performed at [facility], [place] [address] [place], [place] [address]    Report Status 08/12/2017 FINAL  Final  Gram stain     Status: None   Collection Time: 08/06/17  8:23 PM  Result Value Ref Range Status   Specimen Description FLUID PERITONEAL  Final   Special Requests NONE  Final   Gram Stain   Final    FEW WBC PRESENT,BOTH PMN AND MONONUCLEAR NO ORGANISMS SEEN RESULT CALLED TO, READ BACK BY AND VERIFIED WITH: B.JESSEE,RN 0145 08/07/17 M.CAMPBELL Performed at [facility], [place] [address], [place], [place] [address]    Report Status 08/08/2017 FINAL  Final  Gram stain     Status: None   Collection Time: 08/06/17 11:36 PM  Result Value Ref Range Status   Specimen Description PERITONEAL CAVITY  Final   Special Requests Normal  Final   Gram Stain   Final    CYTOSPIN SMEAR WBC PRESENT,BOTH PMN AND MONONUCLEAR NO ORGANISMS SEEN RESULT CALLED TO, READ BACK BY AND VERIFIED WITH: A MARQUEZ,RN @0059  08/07/17 MKELLY    Report Status 08/07/2017 FINAL  Final  Culture, blood (Routine x 2)     Status: None   Collection Time: 08/07/17  6:07 PM  Result Value Ref Range Status   Specimen Description BLOOD RIGHT HAND  Final   Special Requests   Final    BOTTLES DRAWN AEROBIC ONLY Blood Culture adequate volume   Culture   Final    NO  GROWTH 5 DAYS Performed at [facility], [place] [address] [place], [place] [address]    Report Status 08/13/2017 FINAL  Final         Radiology Studies: Ir Cm Inj Any Colonic Tube W/fluoro  Result Date: 08/12/2017 INDICATION: History of gastric cancer with chronic surgically placed jejunostomy catheter, now with concern for persistent jejunostomy leaking. Note, patient recently underwent fluoroscopic guided jejunostomy catheter exchange on 08/10/2017. EXAM: FLUOROSCOPIC GUIDED INJECTION OF EXISTING JEJUNOSTOMY CATHETER COMPARISON:  Fluoroscopic guided jejunostomy catheter exchange - 08/10/2017; CT abdomen pelvis - 08/07/2027 MEDICATIONS: None. CONTRAST:  50 cc Isovue-300 - administered into the jejunum. FLUOROSCOPY TIME:  3 minutes (49 mGy). COMPLICATIONS: None immediate. PROCEDURE: The patient was positioned supine on the fluoroscopy table. Preprocedural spot fluoroscopic image was obtained demonstrating unchanged positioning of the recently exchange jejunostomy catheter. Contrast injection demonstrates opacification of the jejunum. Despite keeping the patient on the fluoroscopy table for prolonged period of time, the injected contrast did not past much beyond the distal end of the jejunostomy catheter. Rather, contrast refluxed retrograde along the jejunostomy tract, eventually opacifying a markedly dilated loop of more proximal small bowel, suspected to represent the descending portion of the duodenum (confirmed with positioning of the patient left lateral decubitus). IMPRESSION: 1. Appropriately positioned and functioning jejunostomy catheter. No exchange performed. 2. Findings worrisome for small bowel obstruction with reflux of contrast along the catheter tract to opacify a markedly dilated loop of more proximal small bowel favored to represent the duodenum. This represents an interval change compared to the 08/06/2017 abdominal CT and further evaluation with repeat CT scan could be performed  as indicated. PLAN: - Given above, the patient's jejunostomy catheter was placed to a Foley catheter however may be placed to wall suction as indicated. - Would consider converting the patient to TPN versus hospice/palliation as indicated. Electronically Signed   By: Sandi Mariscal M.D.   On: 08/12/2017 17:32  Scheduled Meds: . enoxaparin (LOVENOX) injection  40 mg Subcutaneous QHS  . fentaNYL  12.5 mcg Transdermal Q72H  . insulin aspart  0-9 Units Subcutaneous Q6H  . lidocaine-prilocaine   Topical Once  . metoprolol tartrate  2.5 mg Intravenous Q8H  . prochlorperazine  10 mg Intravenous Q6H   Continuous Infusions: . Marland KitchenTPN (CLINIMIX-E) Adult     And  . fat emulsion    . sodium chloride    . dextrose 5 % and 0.45% NaCl 45 mL/hr at 08/13/17 2034  . ondansetron [facility]) IV Stopped (08/13/17 2149)  . potassium chloride 10 mEq (08/14/17 1635)  . Marland KitchenTPN (CLINIMIX-E) Adult 30 mL/hr at 08/13/17 2026     LOS: 6 days    Time spent: 36 minutes    THOMPSON,DANIEL, MD [facility] Pager [phone]  If 7PM-7AM, please contact night-coverage [email or web address] Password [email or web address] 08/14/2017, 4:56 PM

## 2017-08-14 NOTE — Progress Notes (Signed)
Nutrition Follow-up  DOCUMENTATION CODES:   Severe malnutrition in context of chronic illness, Underweight  INTERVENTION:   TPN per Pharmacy -Recommend no lipid infusion given elevated TG levels RD will continue to monitor for plan  NUTRITION DIAGNOSIS:   Malnutrition (severe)related to chronic illness, cancer and cancer related treatments as evidenced by severe depletion of body fat, severe depletion of muscle mass.  Ongoing.  GOAL:   Patient will meet greater than or equal to 90% of their needs  Progressing with TPN.  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 Per record review, pt has completed three cycles of chemo with FOLFOX. Pt was just seen in ED three days PTA for same concern, at that time she had Ct abd done which was notable for hydronephrosis due to mass effect from progressive malignant ascites, ? omental and pelvic peritoneal metastatic disease, ? Cervical mass or metastatic disease. Patient had 3 L paracentesis done, felt better and was discharged home.   TF stopped 10/4 given J-tube leakage of feculent material despite tube replacement. TPN was initiated 10/6. Pt currently receiving Clinimix E 5/15 @ 30 ml/hr and ILE @ 20 ml/hr x 12 hours (providing 991 kcal, 36g protein).   Medications: IV Compazine every 6 hours, IV Zofran PRN, IV Phenergan PRN, D5 -.45% NaCl infusion at 45 ml/hr -provides 183 kcal Labs reviewed: CBGs: 148-160 Mg/Phos WNL TG: 375 mg/dL  Diet Order:  Diet clear liquid Room service appropriate? Yes; Fluid consistency: Thin TPN (CLINIMIX-E) Adult .TPN (CLINIMIX-E) Adult  Skin:  Reviewed, no issues  Last BM:  10/2  Height:   Ht Readings from Last 1 Encounters:  08/07/17 5' 4.75"  (1.645 m)    Weight:   Wt Readings from Last 1 Encounters:  08/13/17 109 lb (49.4 kg)    Ideal Body Weight:  56.8 kg  BMI:  Body mass index is 18.28 kg/m.  Estimated Nutritional Needs:   Kcal:  1500-1700  Protein:  70-80g  Fluid:  1.7L/day  EDUCATION NEEDS:   Education needs no appropriate at this time  Clayton Bibles, MS, RD, LDN Pager: [phone] After Hours Pager: [phone]

## 2017-08-14 NOTE — Progress Notes (Addendum)
[place] NOTE   Pharmacy Consult for TPN Indication: metastatic gastric cancer, ? SBO  Patient Measurements: Height: 5' 4.75" (164.5 cm) Weight: 109 lb (49.4 kg) IBW/kg (Calculated) : 56.43 TPN AdjBW (KG): 49.9 Body mass index is 18.28 kg/m.  Insulin Requirements: none  Current Nutrition: clear liquid diet ordered. Tube feeds currently off   IVF: D5-1/2NS at 45 ml/hr  Central access: implanted port TPN start date: 08/13/2017  ASSESSMENT                                                                                                          HPI:   34 y/oF with PMH of metastatic adenocarcinoma of the stomach, with feeding tube, admitted on 9/30 for persistent nausea, vomiting, and abdominal pain. Patient underwent J-tube exchange and paracentesis per IR on 10/3. Since exchange, feculent material draining at J-tube insertion site and tube feeds stopped. 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 Pharmacy asked to start TPN.   Significant events:   Today:    Glucose - No Hx DM, CBGs slightly above goal since starting TPN (range 132-160, goal 100-150); no low CBGs  Electrolytes - K borderline low, otherwise WNL  Renal - SCr low at baseline, stable  LFTs - Albumin low, otherwise WNL  TGs - pending  Prealbumin - pending  NUTRITIONAL GOALS                                                                                             RD recs (10/4): 1500-1700 Kcal/day, 70-80 g protein/day  Clinimix 5/15 at a goal rate of 65 ml/hr + 20% fat emulsion at 20 ml/hr x 12h a day to provide: 78g/day protein, 1588 Kcal/day.  PLAN                                                                                                                           KCl 40 mEq IV x 4  At 1800 today:  Advance Clinimix E 5/15 to goal rate of 65 ml/hr. No significant drop in electrolytes to suggest concern for refeeding syndrome.  20% fat  emulsion at  20 ml/hr x 12h.  TPN to contain standard multivitamins and trace elements.  Reduce IVF to [facility], remove dextrose  Continue sensitive SSI q6h.    TPN lab panels on Mondays & Thursdays.  F/u daily.  Reuel Boom, PharmD, BCPS Pager: [phone] 08/14/2017, 8:24 AM

## 2017-08-15 ENCOUNTER — Inpatient Hospital Stay (HOSPITAL_COMMUNITY): Payer: Medicaid Other

## 2017-08-15 DIAGNOSIS — E46 Unspecified protein-calorie malnutrition: Secondary | ICD-10-CM

## 2017-08-15 LAB — DIFFERENTIAL
BASOS PCT: 0 %
Basophils Absolute: 0 10*3/uL (ref 0.0–0.1)
EOS PCT: 0 %
Eosinophils Absolute: 0 10*3/uL (ref 0.0–0.7)
Lymphocytes Relative: 9 %
Lymphs Abs: 1.1 10*3/uL (ref 0.7–4.0)
MONO ABS: 0.9 10*3/uL (ref 0.1–1.0)
Monocytes Relative: 7 %
NEUTROS ABS: 10.5 10*3/uL — AB (ref 1.7–7.7)
NEUTROS PCT: 84 %

## 2017-08-15 LAB — GLUCOSE, CAPILLARY
Glucose-Capillary: 129 mg/dL — ABNORMAL HIGH (ref 65–99)
Glucose-Capillary: 141 mg/dL — ABNORMAL HIGH (ref 65–99)
Glucose-Capillary: 146 mg/dL — ABNORMAL HIGH (ref 65–99)
Glucose-Capillary: 159 mg/dL — ABNORMAL HIGH (ref 65–99)

## 2017-08-15 LAB — CBC
HCT: 34.9 % — ABNORMAL LOW (ref 36.0–46.0)
Hemoglobin: 11.8 g/dL — ABNORMAL LOW (ref 12.0–15.0)
MCH: 31.5 pg (ref 26.0–34.0)
MCHC: 33.8 g/dL (ref 30.0–36.0)
MCV: 93.1 fL (ref 78.0–100.0)
PLATELETS: 362 10*3/uL (ref 150–400)
RBC: 3.75 MIL/uL — ABNORMAL LOW (ref 3.87–5.11)
RDW: 18.3 % — AB (ref 11.5–15.5)
WBC: 12.5 10*3/uL — AB (ref 4.0–10.5)

## 2017-08-15 LAB — COMPREHENSIVE METABOLIC PANEL
ALBUMIN: 2.4 g/dL — AB (ref 3.5–5.0)
ALK PHOS: 79 U/L (ref 38–126)
ALT: 15 U/L (ref 14–54)
ANION GAP: 10 (ref 5–15)
AST: 27 U/L (ref 15–41)
BILIRUBIN TOTAL: 0.2 mg/dL — AB (ref 0.3–1.2)
BUN: 14 mg/dL (ref 6–20)
CALCIUM: 8.3 mg/dL — AB (ref 8.9–10.3)
CO2: 25 mmol/L (ref 22–32)
Chloride: 103 mmol/L (ref 101–111)
Creatinine, Ser: 0.43 mg/dL — ABNORMAL LOW (ref 0.44–1.00)
GFR calc Af Amer: >60 mL/min (ref >60)
GLUCOSE: 183 mg/dL — AB (ref 65–99)
Potassium: 4 mmol/L (ref 3.5–5.1)
Sodium: 138 mmol/L (ref 135–145)
TOTAL PROTEIN: 6.1 g/dL — AB (ref 6.5–8.1)

## 2017-08-15 LAB — MAGNESIUM: MAGNESIUM: 2 mg/dL (ref 1.7–2.4)

## 2017-08-15 LAB — PHOSPHORUS: Phosphorus: 3.3 mg/dL (ref 2.5–4.6)

## 2017-08-15 LAB — TRIGLYCERIDES: TRIGLYCERIDES: 289 mg/dL — AB (ref <150)

## 2017-08-15 LAB — PREALBUMIN: Prealbumin: 7.6 mg/dL — ABNORMAL LOW (ref 18–38)

## 2017-08-15 MED ORDER — SODIUM CHLORIDE 0.9 % IV SOLN
12.5000 mg | Freq: Four times a day (QID) | INTRAVENOUS | Status: DC | PRN
  Filled 2017-08-15: qty 0.5

## 2017-08-15 MED ORDER — LORAZEPAM 2 MG/ML IJ SOLN
1.0000 mg | INTRAMUSCULAR | Status: DC | PRN
  Administered 2017-08-16 – 2017-08-18 (×3): 1 mg via INTRAVENOUS
  Filled 2017-08-15 (×5): qty 1

## 2017-08-15 MED ORDER — HALOPERIDOL 0.5 MG PO TABS
0.5000 mg | ORAL_TABLET | ORAL | Status: DC | PRN
  Filled 2017-08-15: qty 1

## 2017-08-15 MED ORDER — MORPHINE SULFATE (CONCENTRATE) 10 MG/0.5ML PO SOLN
5.0000 mg | ORAL | Status: DC | PRN
  Administered 2017-08-15 – 2017-08-17 (×2): 5 mg via SUBLINGUAL
  Filled 2017-08-15 (×2): qty 0.5

## 2017-08-15 MED ORDER — NYSTATIN 100000 UNIT/GM EX POWD
Freq: Three times a day (TID) | CUTANEOUS | Status: DC | PRN
  Filled 2017-08-15: qty 15

## 2017-08-15 MED ORDER — TRACE MINERALS CR-CU-MN-SE-ZN 10-1000-500-60 MCG/ML IV SOLN
INTRAVENOUS | Status: AC
  Administered 2017-08-15: 18:00:00 via INTRAVENOUS
  Filled 2017-08-15: qty 1560

## 2017-08-15 MED ORDER — MORPHINE SULFATE (PF) 4 MG/ML IV SOLN
1.0000 mg | INTRAVENOUS | Status: DC | PRN
  Administered 2017-08-15 – 2017-08-17 (×5): 1 mg via INTRAVENOUS
  Filled 2017-08-15 (×5): qty 1

## 2017-08-15 MED ORDER — LORAZEPAM 2 MG/ML PO CONC: 1.0000 mg | ORAL | Status: DC | PRN

## 2017-08-15 MED ORDER — HALOPERIDOL LACTATE 2 MG/ML PO CONC
0.5000 mg | ORAL | Status: DC | PRN
  Filled 2017-08-15: qty 0.3

## 2017-08-15 MED ORDER — GLYCOPYRROLATE 1 MG PO TABS
1.0000 mg | ORAL_TABLET | ORAL | Status: DC | PRN
  Filled 2017-08-15: qty 1

## 2017-08-15 MED ORDER — MORPHINE SULFATE (CONCENTRATE) 10 MG/0.5ML PO SOLN: 5.0000 mg | ORAL | Status: DC | PRN

## 2017-08-15 MED ORDER — DIPHENHYDRAMINE HCL 50 MG/ML IJ SOLN: 12.5000 mg | INTRAMUSCULAR | Status: DC | PRN

## 2017-08-15 MED ORDER — ACETAMINOPHEN 650 MG RE SUPP: 650.0000 mg | Freq: Four times a day (QID) | RECTAL | Status: DC | PRN

## 2017-08-15 MED ORDER — HALOPERIDOL LACTATE 5 MG/ML IJ SOLN
0.5000 mg | INTRAMUSCULAR | Status: DC | PRN
  Administered 2017-08-17 – 2017-08-21 (×4): 0.5 mg via INTRAVENOUS
  Filled 2017-08-15 (×4): qty 1

## 2017-08-15 MED ORDER — BIOTENE DRY MOUTH MT LIQD
15.0000 mL | OROMUCOSAL | Status: DC | PRN
  Administered 2017-08-16: 15 mL via TOPICAL
  Filled 2017-08-15: qty 15

## 2017-08-15 MED ORDER — ONDANSETRON 4 MG PO TBDP: 4.0000 mg | ORAL_TABLET | Freq: Four times a day (QID) | ORAL | Status: DC | PRN

## 2017-08-15 MED ORDER — LIDOCAINE HCL 2 % IJ SOLN
INTRAMUSCULAR | Status: AC
  Filled 2017-08-15: qty 10

## 2017-08-15 MED ORDER — GLYCOPYRROLATE 0.2 MG/ML IJ SOLN
0.2000 mg | INTRAMUSCULAR | Status: DC | PRN
  Filled 2017-08-15: qty 1

## 2017-08-15 MED ORDER — LORAZEPAM 2 MG/ML IJ SOLN
1.0000 mg | INTRAMUSCULAR | Status: DC | PRN
  Administered 2017-08-17 – 2017-08-21 (×5): 1 mg via INTRAVENOUS
  Filled 2017-08-15 (×4): qty 1

## 2017-08-15 MED ORDER — POLYVINYL ALCOHOL 1.4 % OP SOLN
1.0000 [drp] | Freq: Four times a day (QID) | OPHTHALMIC | Status: DC | PRN
  Filled 2017-08-15: qty 15

## 2017-08-15 MED ORDER — ONDANSETRON HCL 4 MG/2ML IJ SOLN
4.0000 mg | Freq: Four times a day (QID) | INTRAMUSCULAR | Status: DC | PRN
  Administered 2017-08-16: 4 mg via INTRAVENOUS
  Filled 2017-08-15: qty 2

## 2017-08-15 MED ORDER — FAT EMULSION 20 % IV EMUL
240.0000 mL | INTRAVENOUS | Status: AC
  Administered 2017-08-15: 240 mL via INTRAVENOUS
  Filled 2017-08-15: qty 250

## 2017-08-15 MED ORDER — ACETAMINOPHEN 325 MG PO TABS: 650.0000 mg | ORAL_TABLET | Freq: Four times a day (QID) | ORAL | Status: DC | PRN

## 2017-08-15 MED ORDER — LORAZEPAM 1 MG PO TABS
1.0000 mg | ORAL_TABLET | ORAL | Status: DC | PRN
  Administered 2017-08-19: 1 mg via ORAL

## 2017-08-15 NOTE — Progress Notes (Signed)
   08/15/17 2000  Clinical Encounter Type  Visited With Patient and family together;Health care provider  Visit Type Initial;Psychological support;Spiritual support;Patient actively dying  Referral From Physician  Consult/Referral To Palliative care;Faith community  Spiritual Encounters  Spiritual Needs Emotional;Ritual;Prayer;Grief support;[facility] text;Literature  Stress Factors  Patient Stress Factors None identified  Family Stress Factors Loss of control   Chaplain was paged around 5:30pm for an end of life visit. Family was opened to the visit. Pt did not seem to be in pain. Family at bedside. Patient has gastric cancer to the peritoneum and malignant ascites. According to medical staff patient with no significant improvement. RN reported that Patient's oncologist, Dr. Burr Medico met with the family and decision has been made for full comfort measures. End of life orders have been set. Patient will be going to a residential hospice home in the morning. Chaplain stayed while family's [place] (from [place]) prayed with family. Chaplain give family a prayer blanket and provided support for the RN's who were taking care of PT.   If there are any other emotional or spiritual concerns please don't hesitate to page the On-call chaplain.  [employer]

## 2017-08-15 NOTE — Procedures (Signed)
Ultrasound-guided therapeutic paracentesis performed yielding 1.8 liters of serous colored fluid. No immediate complications.  Claire Bray E 2:17 PM 08/15/2017

## 2017-08-15 NOTE — Progress Notes (Signed)
PHARMACY - ADULT TOTAL PARENTERAL NUTRITION CONSULT NOTE   Pharmacy Consult for TPN Indication: metastatic gastric cancer, SBO  Patient Measurements: Height: 5' 4.75" (164.5 cm) Weight: 108 lb (49 kg) IBW/kg (Calculated) : 56.43 TPN AdjBW (KG): 49.9 Body mass index is 18.11 kg/m.  Insulin Requirements: 5 units/24 hr  Current Nutrition: clear liquid diet ordered. Tube feeds currently off   IVF: 1/2NS at 10 ml/hr  Central access: implanted port TPN start date: 08/13/2017  ASSESSMENT                                                                                                          HPI:   31 y/oF with PMH of metastatic adenocarcinoma of the stomach, with feeding tube, admitted on 9/30 for persistent nausea, vomiting, and abdominal pain. Patient underwent J-tube exchange and paracentesis per IR on 10/3. Since exchange, feculent material draining at J-tube insertion site and tube feeds stopped. 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 Pharmacy asked to start TPN.   Significant events:  10/8: plan paracentesis today; ordered CT scan, pelvic U/S, gastric emptying study  Today:   Glucose - No Hx DM, CBGs slightly above goal since starting TPN (range 127-159, goal 100-150); no low CBGs  Electrolytes - WNL, corr Ca 9.5  Renal - SCr low at baseline, stable  LFTs - Albumin low, otherwise WNL  TGs -289 (10/8)  Prealbumin - 8.6 (10/7)  NUTRITIONAL GOALS                                                                                             RD recs (10/4): 1500-1700 Kcal/day, 70-80 g protein/day  Clinimix 5/15 at a goal rate of 65 ml/hr + 20% fat emulsion at 20 ml/hr x 12h a day to provide: 78g/day protein, 1588 Kcal/day.  PLAN                                                                                                                          Albumin 50gm x1 today post paracentesis  At 1800 today:  Continue Clinimix E 5/15 at goal rate of 65  ml/hr.   20% fat emulsion  at 20 ml/hr over 12h daily  TPN to contain standard multivitamins and trace elements.  Continue sensitive SSI q6h.    TPN lab panels on Mondays & Thursdays.  F/u daily.  Minda Ditto PharmD Pager [phone] 08/15/2017, 11:35 AM

## 2017-08-15 NOTE — Progress Notes (Signed)
Claire Bray   DOB:1976-02-23   TM#:[id number]   GXQ#:[id number]  Heme/onc follow-up  Subjective: Pt started TPN over the weekend. She still has significant abdominal pain and nausea, vomited 4 times since this morning. She is very tired, drowsy, dyspnea improved after paracentesis today.   Objective:  Vitals:   08/15/17 1328 08/15/17 1525  BP: (!) 145/105 (!) 138/101  Pulse: (!) 132 (!) 122  Resp:  16  Temp:  98.8 F (37.1 C)  SpO2:  100%    Body mass index is 18.11 kg/m.  Intake/Output Summary (Last 24 hours) at 08/15/17 1659 Last data filed at 08/15/17 1543  Gross per 24 hour  Intake          1851.08 ml  Output              400 ml  Net          1451.08 ml     Sclerae unicteric  Oropharynx clear  No peripheral adenopathy  Lungs clear -- no rales or rhonchi  Heart regular rate and rhythm  Abdomen distended and firm, mildly tender, left PEG tube present, gauze stressing C/D/I  MSK no focal spinal tenderness, no peripheral edema  Neuro nonfocal  CBG (last 3)   Recent Labs  08/15/17 0011 08/15/17 0557 08/15/17 1235  GLUCAP 141* 159* 146*     Labs:  Lab Results  Component Value Date   WBC 12.5 (H) 08/15/2017   HGB 11.8 (L) 08/15/2017   HCT 34.9 (L) 08/15/2017   MCV 93.1 08/15/2017   PLT 362 08/15/2017   NEUTROABS 10.5 (H) 08/15/2017    Urine Studies No results for input(s): UHGB, CRYS in the last 72 hours.  Invalid input(s): UACOL, UAPR, USPG, UPH, UTP, UGL, [place], UBIL, UNIT, UROB, [place], UEPI, UWBC, Junie Panning [place], [place], [place]  Basic Metabolic Panel:  Recent Labs Lab 08/10/17 0500 08/11/17 0600 08/12/17 0500 08/13/17 0500 08/13/17 1610 08/14/17 0614 08/15/17 0508  NA 143 144 146* 145  --  140 138  K 3.7 3.6 3.8 3.7  --  3.5 4.0  CL 106 106 108 107  --  104 103  CO2 25 25 26 28   --  25 25  GLUCOSE 99 111* 103* 150*  --  166* 183*  BUN 15 15 15 11   --  11 14  CREATININE 0.54 0.52 0.51 0.42*  --  0.44 0.43*  CALCIUM 8.5* 8.2* 8.5* 8.6*  --   8.3* 8.3*  MG 2.0  --   --   --  1.9 2.0 2.0  PHOS  --   --   --   --  3.1 3.3 3.3   GFR Estimated Creatinine Clearance: 70.9 mL/min (A) (by C-G formula based on SCr of 0.43 mg/dL (L)). Liver Function Tests:  Recent Labs Lab 08/09/17 0510 08/13/17 1610 08/14/17 0614 08/15/17 0508  AST 23 34 31 27  ALT 19 17 15 15   ALKPHOS 59 78 78 79  BILITOT 0.6 0.8 0.3 0.2*  PROT 5.9* 6.2* 6.3* 6.1*  ALBUMIN 2.2* 2.5* 2.5* 2.4*    Recent Labs Lab 08/09/17 0510  LIPASE 18   No results for input(s): AMMONIA in the last 168 hours. Coagulation profile No results for input(s): INR, PROTIME in the last 168 hours.  CBC:  Recent Labs Lab 08/10/17 0500 08/11/17 0600 08/13/17 0500 08/14/17 0614 08/15/17 0508  WBC 4.7 6.1 8.6 10.2 12.5*  NEUTROABS  --   --  6.6 8.1* 10.5*  HGB 10.5*  10.8* 11.7* 11.9* 11.8*  HCT 33.5* 33.9* 35.7* 33.7* 34.9*  MCV 87.9 89.2 93.5 95.2 93.1  PLT 462* 437* 488* 419* 362   Cardiac Enzymes: No results for input(s): CKTOTAL, CKMB, CKMBINDEX, TROPONINI in the last 168 hours. BNP: Invalid input(s): POCBNP CBG:  Recent Labs Lab 08/14/17 1234 08/14/17 1756 08/15/17 0011 08/15/17 0557 08/15/17 1235  GLUCAP 140* 127* 141* 159* 146*   D-Dimer No results for input(s): DDIMER in the last 72 hours. Hgb A1c No results for input(s): HGBA1C in the last 72 hours. Lipid Profile  Recent Labs  08/14/17 0614 08/15/17 0508  TRIG 375* 289*   Thyroid function studies No results for input(s): TSH, T4TOTAL, T3FREE, THYROIDAB in the last 72 hours.  Invalid input(s): FREET3 Anemia work up No results for input(s): VITAMINB12, FOLATE, FERRITIN, TIBC, IRON, RETICCTPCT in the last 72 hours. Microbiology Recent Results (from the past 240 hour(s))  Culture, body fluid-bottle     Status: None   Collection Time: 08/06/17  8:23 PM  Result Value Ref Range Status   Specimen Description FLUID PERITONEAL  Final   Special Requests   Final    BOTTLES DRAWN AEROBIC ONLY  Blood Culture results may not be optimal due to an excessive volume of blood received in culture bottles   Culture   Final    NO GROWTH 5 DAYS Performed at [facility], [place] [address], [place], [place] [address]    Report Status 08/12/2017 FINAL  Final  Gram stain     Status: None   Collection Time: 08/06/17  8:23 PM  Result Value Ref Range Status   Specimen Description FLUID PERITONEAL  Final   Special Requests NONE  Final   Gram Stain   Final    FEW WBC PRESENT,BOTH PMN AND MONONUCLEAR NO ORGANISMS SEEN RESULT CALLED TO, READ BACK BY AND VERIFIED WITH: B.JESSEE,RN 0145 08/07/17 M.CAMPBELL Performed at [facility], [place] [address] [place], [place] [address]    Report Status 08/08/2017 FINAL  Final  Gram stain     Status: None   Collection Time: 08/06/17 11:36 PM  Result Value Ref Range Status   Specimen Description PERITONEAL CAVITY  Final   Special Requests Normal  Final   Gram Stain   Final    CYTOSPIN SMEAR WBC PRESENT,BOTH PMN AND MONONUCLEAR NO ORGANISMS SEEN RESULT CALLED TO, READ BACK BY AND VERIFIED WITH: A MARQUEZ,RN @0059  08/07/17 MKELLY    Report Status 08/07/2017 FINAL  Final  Culture, blood (Routine x 2)     Status: None   Collection Time: 08/07/17  6:07 PM  Result Value Ref Range Status   Specimen Description BLOOD RIGHT HAND  Final   Special Requests   Final    BOTTLES DRAWN AEROBIC ONLY Blood Culture adequate volume   Culture   Final    NO GROWTH 5 DAYS Performed at [facility], [place] [address] [place], [place] [address]    Report Status 08/13/2017 FINAL  Final    Studies:  US Abdomen Limited  Result Date: 08/14/2017 CLINICAL DATA:  Abdominal distension. EXAM: LIMITED ABDOMEN ULTRASOUND FOR ASCITES TECHNIQUE: Limited ultrasound survey for ascites was performed in all four abdominal quadrants. COMPARISON:  None. FINDINGS: Moderate ascites noted in all 4 quadrants. IMPRESSION: Moderate ascites. Electronically Signed   By: Titus Dubin M.D.   On: 08/14/2017 17:49   US Paracentesis  Result Date: 08/15/2017 INDICATION: Patient with history of metastatic gastric adenocarcinoma. Recurrent abdominal ascites. Request  is made for therapeutic paracentesis. EXAM: ULTRASOUND GUIDED THERAPEUTIC PARACENTESIS MEDICATIONS: 2% xylocaine COMPLICATIONS: None immediate. PROCEDURE: Informed written consent was obtained from the patient after a discussion of the risks, benefits and alternatives to treatment. A timeout was performed prior to the initiation of the procedure. Initial ultrasound scanning demonstrates a small amount of ascites within the right upper abdominal quadrant. The right upper abdomen was prepped and draped in the usual sterile fashion. 2% xylocaine was used for local anesthesia. Following this, a 19 gauge, 7-cm, Yueh catheter was introduced. An ultrasound image was saved for documentation purposes. The paracentesis was performed. The catheter was removed and a dressing was applied. The patient tolerated the procedure well without immediate post procedural complication. FINDINGS: A total of approximately 1.8 L of serous fluid was removed. IMPRESSION: Successful ultrasound-guided paracentesis yielding 1.8 liters of peritoneal fluid. Read by: Saverio Danker, PA-C Electronically Signed   By: Lucrezia Europe M.D.   On: 08/15/2017 14:20   Dg Abd 2 Views  Result Date: 08/14/2017 CLINICAL DATA:  Abdominal distension. Gastric cancer. Jejunostomy tube. EXAM: ABDOMEN - 2 VIEW COMPARISON:  08/12/2017 jejunostomy fluoroscopy study. 08/10/2017 abdominal radiographs. FINDINGS: Jejunostomy tube tip appears stable in position in the left lower abdomen. Oral contrast is present within markedly dilated small bowel loops in the central abdomen, which appear increased in caliber in the interval. Minimal colonic stool. No evidence of pneumatosis or pneumoperitoneum. IMPRESSION: Stable position of left lower abdominal jejunostomy tube. Worsening markedly  dilated small bowel loops in the central abdomen containing oral contrast, suspicious for worsening mid to distal small bowel obstruction, as suggested on the 08/12/2017 interventional radiology report from the fluoroscopic jejunostomy tube evaluation. Electronically Signed   By: Ilona Sorrel M.D.   On: 08/14/2017 18:00    Assessment: 41 y.o. 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 refractory nausea, vomiting, fatigue, abdominal pain, secondary to #2 2.  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  hydronephrosis secondary to ascites 5.  severe protein calorie malnutrition 6. 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 She is deteriorating quickly, I don't see the possibility she will improve and can be discharged from hospital and come to clinic for treatment. So I recommend hospice. She is likely going to die in a few days, up to a week, without IVF and nutrition support. She could not tolerate PO or tube feeds due to her SBO. I suggest continue drainage from J-tube for SBO -I had a long conversation with patient, her husband, and has close friend Tiffany with the video interpreter. They all agree with inpatient hospice, family does not think they can take care of her at home -I spoke with Dr. Grandville Silos, he will arrange hospice consult and transferring to [facility] place tomorrow -given her very short life expectancy, I will probably not put a Pleurx in, she is likely going to be on morphine drip and comfort care for her symptoms.     Truitt Merle, MD 08/15/2017  4:59 PM

## 2017-08-15 NOTE — Progress Notes (Signed)
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 Per record review, pt has completed three cycles of chemo with FOLFOX. Pt was just seen in ED three days PTA for same concern, at that time she had Ct abd done which was notable for hydronephrosis due to mass effect from progressive malignant ascites, ? omental and pelvic peritoneal metastatic disease, ? Cervical mass or metastatic disease. Patient had 3 L paracentesis done, felt better and was discharged home.  Patient with no significant improvement during this hospitalization with a worsening course and probable recurrent ascites and small bowel obstruction.   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 J-tube study done negative for any leakage. Patient subsequently underwent exchange of J-tube per IR 08/10/2017 per discussions with Dr.Feng  and Dr. Barry Dienes. Oncology holding off on Pleurx placement now due to increased risk for peritonitis.  Per oncology patient will likely need weekly therapeutic paracentesis. Patient with worsening abdominal pain, abdomen more firm and more distended with further nausea and emesis despite scheduled  Compazine. PEG tube site with some feculent drainage around insertion site. IR and general surgery to reassess that site.  IR assessed J-tube under fluoroscopy with concerns for small bowel obstruction and as such J-tube was placed to Foley catheter. Continue scheduled Compazine, current pain regimen. Continue Duragesic patch. Abdominal films with worsening mid to distal small bowel obstruction. Oncology, Dr. Burr Medico has met with family and patient with a significantly poor prognosis. Decision has been made for full comfort measures. Social work consult for residential hospice placement.  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 Patient status post J-tube exchange per IR as noted in problem #1. Patient status post paracenteses per interventional radiology 08/10/2017 uterine 1.4 L of serous fluid. Patient has been started on a fentanyl patch for pain management.  Patient still nauseous with some emesis. PEG tube site with less drainage with a feculent odor a few days ago and currently now as a foley to gravity.. Per hematology/oncology IR and general surgery to assess PEG site. Patient with worsening significant abdominal distention and firmness and now with further nausea and vomiting with drainage noted around PEG tube site on 08/12/2017.  Concern for possible reaccumulation of malignant ascites. Hematology/oncology holding off on pleurx catheter placement due to risk of peritonitis. Patient will likely require weekly therapeutic paracentesis done per oncology who will arrange for them. Continue supportive care. IR assessed J-tube and there was concern for small bowel obstruction and as such J-tube placed to Foley catheter. TPN started 08/13/2017 per oncology recommendations. Oncology following and appreciate input and recommendations. Abdominal ultrasound with reaccumulation of ascites. Patient underwent therapeutic  paracentesis today  with 1.8 L removed.  Patient has been assessed by oncology, Dr. Burr Medico again today and decision has been made for full comfort measures. Consult chaplain. Social work consult for residential hospice placement.   #3 severe protein calorie malnutrition/nutrition Secondary to problem #2. J-tube has been exchange per IR. Patient currently with nausea and emesis after being started on scheduled antiemetics. Patient with complaints of abdominal pain patient noted to have some abdominal distention with firmness and tightness. Osmolite has been discontinued due to issues surrounding PEG tube. Hematology/oncology, Dr. Burr Medico to discuss with patient concerning possibility of starting TPN for nutrition due to issues surrounding PEG tube site and metastatic malignant gastric cancer. TPN started 08/13/2017. Will discontinue TPN on discharge as patient is now full comfort measures.  #4 hydronephrosis secondary to ascites Patient status post paracenteses with 1.4 L of serous fluid removed 08/10/2017. Renal function stable. Urine output not recorded. Renal function stable. Abdomen distended firm and tight. Patient noted to have recurrent malignant ascites and underwent ultrasound-guided therapeutic paracentesis 08/15/2017 with some improvement with abdominal discomfort. Patient has been seen by her oncologist again today, Dr. Burr Medico will discuss with the family and decision admitted for full comfort measures. Patient will need a residential hospice home.   #5 anemia of neoplastic disease/iron deficiency anemia   patient with no overt bleeding. Hemoglobin stable at 11.9. Follow H&H.   #6 ?? J-tube malfunction Per nursing patient noted to have significant drainage noted around insertion site of PEG tube and skin. Per nursing gauze dressing has been significantly soaked. Osmolite tube feeds have been on hold and not resumed.  Patient was assessed by IR and underwent G-tube injection to rule out obstruction  or fistula formation 08/12/2017. It was noted that jejunostomy catheter was appropriately positioned and functioning, findings worrisome for small bowel obstruction with reflux of contrast along catheter tract to opacify a markedly dilated loop of more proximal small bowel favored to represent duodenal. Given the change jejunostomy catheter was placed to Foley catheter. TPN for nutrition started on 08/13/2017. Per Dr. Burr Medico, hematology/oncology general surgery and IR have been notified to reassess the patient. See #7.  #7 prognosis Patient with a poor prognosis. Patient with metastatic gastric cancer to the peritoneum and malignant ascites. Patient with probable small bowel obstruction as well as with ongoing nausea vomiting. Patient with feculent drainage from PEG tube site. Patient with no significant improvement. Patient's oncologist, Dr. Burr Medico met with the family and decision has been made for full comfort measures. Will place End of life orders set. Chaplain to bedside. Patient will likely need a residential hospice home.   DVT prophylaxis: Lovenox Code Status: DO NOT RESUSCITATE per Dr. Ernestina Penna note from 08/12/2017 Family Communication: Updated patient and family at bedside.  Disposition Plan: To be determined.   Consultants:   Palliative care Dr.Anwar 08/09/2017  Gen. surgery Dr.Gerkin 08/09/2017  Oncology Dr. Burr Medico 08/08/2017  Procedures:   CT abdomen and pelvis 08/06/2017  Abdominal films 08/07/2017  Fluoroscopic guided replacement of jejunostomy tube 08/10/2017 per Dr. Pascal Lux  Ultrasound-guided paracentesis yielding 1.4 L of serous fluid per Dr. Pascal Lux 08/10/2017  DG abdomen PEG tube location 08/09/2017  Fluoroscopic guided injection of existing jejunostomy catheter per Dr. Pascal Lux 08/12/2017  Antimicrobials:   None   Subjective: Patient with nausea and emesis. Patient states some improvement with abdominal pain after paracenthesis. Patient asking for ice water.    Objective: Vitals:   08/15/17 1206 08/15/17 1213 08/15/17 1215 08/15/17 1256  BP: (!) 142/107 (!) 134/101 Marland Kitchen)  140/105 (!) 138/107  Pulse:    (!) 136  Resp:      Temp:      TempSrc:      SpO2:      Weight:      Height:        Intake/Output Summary (Last 24 hours) at 08/15/17 1320 Last data filed at 08/14/17 2208  Gross per 24 hour  Intake           1217.5 ml  Output              400 ml  Net            817.5 ml   Filed Weights   08/12/17 0500 08/13/17 0828 08/14/17 1346  Weight: 50.3 kg (111 lb) 49.4 kg (109 lb) 49 kg (108 lb)    Examination:  General exam: NAD. Cachetic. Frail. Respiratory system: Clear to auscultation anterior lung fields. No wheezes, no crackles, no rhonchi.  Cardiovascular system: Tachycardia, no murmurs rubs or gallops. No pedal edema.  Gastrointestinal system: Abdomen is distended, firm, some tenderness to palpation, hypoactive bowel sounds. Feeding tube with Foley with feculent output.  Central nervous system: Alert and oriented. No focal neurological deficits. Extremities: Symmetric 5 x 5 power. Skin: No rashes, lesions or ulcers Psychiatry: Judgement and insight appear normal. Mood & affect appropriate.     Data Reviewed: I have personally reviewed following labs and imaging studies  CBC:  Recent Labs Lab 08/10/17 0500 08/11/17 0600 08/13/17 0500 08/14/17 0614 08/15/17 0508  WBC 4.7 6.1 8.6 10.2 12.5*  NEUTROABS  --   --  6.6 8.1* 10.5*  HGB 10.5* 10.8* 11.7* 11.9* 11.8*  HCT 33.5* 33.9* 35.7* 33.7* 34.9*  MCV 87.9 89.2 93.5 95.2 93.1  PLT 462* 437* 488* 419* 712   Basic Metabolic Panel:  Recent Labs Lab 08/10/17 0500 08/11/17 0600 08/12/17 0500 08/13/17 0500 08/13/17 1610 08/14/17 0614 08/15/17 0508  NA 143 144 146* 145  --  140 138  K 3.7 3.6 3.8 3.7  --  3.5 4.0  CL 106 106 108 107  --  104 103  CO2 '25 25 26 28  '$ --  25 25  GLUCOSE 99 111* 103* 150*  --  166* 183*  BUN '15 15 15 11  '$ --  11 14  CREATININE 0.54 0.52  0.51 0.42*  --  0.44 0.43*  CALCIUM 8.5* 8.2* 8.5* 8.6*  --  8.3* 8.3*  MG 2.0  --   --   --  1.9 2.0 2.0  PHOS  --   --   --   --  3.1 3.3 3.3   GFR: Estimated Creatinine Clearance: 70.9 mL/min (A) (by C-G formula based on SCr of 0.43 mg/dL (L)). Liver Function Tests:  Recent Labs Lab 08/09/17 0510 08/13/17 1610 08/14/17 0614 08/15/17 0508  AST 23 34 31 27  ALT '19 17 15 15  '$ ALKPHOS 59 78 78 79  BILITOT 0.6 0.8 0.3 0.2*  PROT 5.9* 6.2* 6.3* 6.1*  ALBUMIN 2.2* 2.5* 2.5* 2.4*    Recent Labs Lab 08/09/17 0510  LIPASE 18   No results for input(s): AMMONIA in the last 168 hours. Coagulation Profile: No results for input(s): INR, PROTIME in the last 168 hours. Cardiac Enzymes: No results for input(s): CKTOTAL, CKMB, CKMBINDEX, TROPONINI in the last 168 hours. BNP (last 3 results) No results for input(s): PROBNP in the last 8760 hours. HbA1C: No results for input(s): HGBA1C in the last 72 hours. CBG:  Recent  Labs Lab 08/14/17 1234 08/14/17 1756 08/15/17 0011 08/15/17 0557 08/15/17 1235  GLUCAP 140* 127* 141* 159* 146*   Lipid Profile:  Recent Labs  08/14/17 0614 08/15/17 0508  TRIG 375* 289*   Thyroid Function Tests: No results for input(s): TSH, T4TOTAL, FREET4, T3FREE, THYROIDAB in the last 72 hours. Anemia Panel: No results for input(s): VITAMINB12, FOLATE, FERRITIN, TIBC, IRON, RETICCTPCT in the last 72 hours. Sepsis Labs: No results for input(s): PROCALCITON, LATICACIDVEN in the last 168 hours.  Recent Results (from the past 240 hour(s))  Culture, body fluid-bottle     Status: None   Collection Time: 08/06/17  8:23 PM  Result Value Ref Range Status   Specimen Description FLUID PERITONEAL  Final   Special Requests   Final    BOTTLES DRAWN AEROBIC ONLY Blood Culture results may not be optimal due to an excessive volume of blood received in culture bottles   Culture   Final    NO GROWTH 5 DAYS Performed at [facility], [place] [address]  [place], [place] [address]    Report Status 08/12/2017 FINAL  Final  Gram stain     Status: None   Collection Time: 08/06/17  8:23 PM  Result Value Ref Range Status   Specimen Description FLUID PERITONEAL  Final   Special Requests NONE  Final   Gram Stain   Final    FEW WBC PRESENT,BOTH PMN AND MONONUCLEAR NO ORGANISMS SEEN RESULT CALLED TO, READ BACK BY AND VERIFIED WITH: B.JESSEE,RN 0145 08/07/17 M.CAMPBELL Performed at [facility], [place] [address] [place], [place] [address]    Report Status 08/08/2017 FINAL  Final  Gram stain     Status: None   Collection Time: 08/06/17 11:36 PM  Result Value Ref Range Status   Specimen Description PERITONEAL CAVITY  Final   Special Requests Normal  Final   Gram Stain   Final    CYTOSPIN SMEAR WBC PRESENT,BOTH PMN AND MONONUCLEAR NO ORGANISMS SEEN RESULT CALLED TO, READ BACK BY AND VERIFIED WITH: A MARQUEZ,RN '@[id number]'$  08/07/17 MKELLY    Report Status 08/07/2017 FINAL  Final  Culture, blood (Routine x 2)     Status: None   Collection Time: 08/07/17  6:07 PM  Result Value Ref Range Status   Specimen Description BLOOD RIGHT HAND  Final   Special Requests   Final    BOTTLES DRAWN AEROBIC ONLY Blood Culture adequate volume   Culture   Final    NO GROWTH 5 DAYS Performed at [facility], [place] [address], [place], [place] [address]    Report Status 08/13/2017 FINAL  Final         Radiology Studies: US Abdomen Limited  Result Date: 08/14/2017 CLINICAL DATA:  Abdominal distension. EXAM: LIMITED ABDOMEN ULTRASOUND FOR ASCITES TECHNIQUE: Limited ultrasound survey for ascites was performed in all four abdominal quadrants. COMPARISON:  None. FINDINGS: Moderate ascites noted in all 4 quadrants. IMPRESSION: Moderate ascites. Electronically Signed   By: Titus Dubin M.D.   On: 08/14/2017 17:49   Dg Abd 2 Views  Result Date: 08/14/2017 CLINICAL DATA:  Abdominal distension. Gastric cancer. Jejunostomy tube. EXAM: ABDOMEN - 2 VIEW COMPARISON:   08/12/2017 jejunostomy fluoroscopy study. 08/10/2017 abdominal radiographs. FINDINGS: Jejunostomy tube tip appears stable in position in the left lower abdomen. Oral contrast is present within markedly dilated small bowel loops in the central abdomen, which appear increased in caliber in the interval. Minimal colonic stool. No evidence of pneumatosis or pneumoperitoneum. IMPRESSION:  Stable position of left lower abdominal jejunostomy tube. Worsening markedly dilated small bowel loops in the central abdomen containing oral contrast, suspicious for worsening mid to distal small bowel obstruction, as suggested on the 08/12/2017 interventional radiology report from the fluoroscopic jejunostomy tube evaluation. Electronically Signed   By: Ilona Sorrel M.D.   On: 08/14/2017 18:00        Scheduled Meds: . enoxaparin (LOVENOX) injection  40 mg Subcutaneous QHS  . fentaNYL  12.5 mcg Transdermal Q72H  . insulin aspart  0-9 Units Subcutaneous Q6H  . lidocaine      . lidocaine-prilocaine   Topical Once  . metoprolol tartrate  2.5 mg Intravenous Q8H  . prochlorperazine  10 mg Intravenous Q6H   Continuous Infusions: . Marland KitchenTPN (CLINIMIX-E) Adult 65 mL/hr at 08/14/17 1834  . Marland KitchenTPN (CLINIMIX-E) Adult     And  . fat emulsion    . sodium chloride 10 mL/hr at 08/14/17 1829  . ondansetron (ZOFRAN) IV Stopped (08/14/17 2231)     LOS: 7 days    Time spent: 71 minutes    Anahis Furgeson, MD [facility] Pager [phone]  If 7PM-7AM, please contact night-coverage [email or web address] Password [email or web address] 08/15/2017, 1:20 PM

## 2017-08-16 LAB — GLUCOSE, CAPILLARY
Glucose-Capillary: 144 mg/dL — ABNORMAL HIGH (ref 65–99)
Glucose-Capillary: 155 mg/dL — ABNORMAL HIGH (ref 65–99)

## 2017-08-16 MED ORDER — MENTHOL 3 MG MT LOZG
1.0000 | LOZENGE | OROMUCOSAL | Status: DC | PRN
  Administered 2017-08-16: 3 mg via ORAL
  Filled 2017-08-16: qty 9

## 2017-08-16 MED ORDER — TRACE MINERALS CR-CU-MN-SE-ZN 10-1000-500-60 MCG/ML IV SOLN
INTRAVENOUS | Status: DC
  Administered 2017-08-16: 17:00:00 via INTRAVENOUS
  Filled 2017-08-16: qty 1560

## 2017-08-16 MED ORDER — METOPROLOL TARTRATE 5 MG/5ML IV SOLN
5.0000 mg | Freq: Once | INTRAVENOUS | Status: DC
  Filled 2017-08-16: qty 5

## 2017-08-16 MED ORDER — METOPROLOL TARTRATE 5 MG/5ML IV SOLN
5.0000 mg | Freq: Three times a day (TID) | INTRAVENOUS | Status: DC
  Administered 2017-08-16 (×2): 5 mg via INTRAVENOUS
  Filled 2017-08-16 (×2): qty 5

## 2017-08-16 MED ORDER — FAT EMULSION 20 % IV EMUL
240.0000 mL | INTRAVENOUS | Status: DC
  Administered 2017-08-16: 240 mL via INTRAVENOUS
  Filled 2017-08-16: qty 250

## 2017-08-16 NOTE — Progress Notes (Signed)
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 Per record review, pt has completed three cycles of chemo with FOLFOX. Pt was just seen in ED three days PTA for same concern, at that time she had Ct abd done which was notable for hydronephrosis due to mass effect from progressive malignant ascites, ? omental and pelvic peritoneal metastatic disease, ? Cervical mass or metastatic disease. Patient had 3 L paracentesis done, felt better and was discharged home.  Patient with no significant improvement during this hospitalization with a worsening course and probable recurrent ascites and small bowel obstruction.   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 J-tube study done negative for any leakage. Patient subsequently underwent exchange of J-tube per IR 08/10/2017 per discussions with Dr.Feng  and Dr. Barry Dienes. Oncology holding off on Pleurx placement now due to increased risk for peritonitis.  Per oncology patient will likely need weekly therapeutic paracentesis. Patient with worsening abdominal pain, abdomen more firm and more distended with further nausea and emesis despite scheduled  Compazine. PEG tube site with some feculent drainage around insertion site. IR and general surgery to reassess that site.  IR assessed J-tube under fluoroscopy with concerns for small bowel obstruction and as such J-tube was placed to Foley catheter. Continue scheduled Compazine, current pain regimen. Continue Duragesic patch. Abdominal films with worsening mid to distal small bowel obstruction. Oncology, Dr. Burr Medico has met with family and patient with a significantly poor prognosis. Decision has been made for full comfort measures. Social work consult for residential hospice placement.  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 Patient status post J-tube exchange per IR as noted in problem #1. Patient status post paracenteses per interventional radiology 08/10/2017 uterine 1.4 L of serous fluid. Patient has been started on a fentanyl patch for pain management.  Patient still nauseous with some emesis. PEG tube site with less drainage with a feculent odor a few days ago and currently now as a foley to gravity.. Per hematology/oncology IR and general surgery to assess PEG site. Patient with worsening significant abdominal distention and firmness and now with further nausea and vomiting with drainage noted around PEG tube site on 08/12/2017.  Concern for possible reaccumulation of malignant ascites. Hematology/oncology holding off on pleurx catheter placement due to risk of peritonitis. Patient will likely require weekly therapeutic paracentesis done per oncology who will arrange for them. Continue supportive care. IR assessed J-tube and there was concern for small bowel obstruction and as such J-tube placed to Foley catheter. TPN started 08/13/2017 per oncology recommendations. Oncology following and appreciate input and recommendations. Abdominal ultrasound with reaccumulation of ascites. Patient underwent therapeutic  paracentesis today  with 1.8 L removed.  Patient has been assessed by oncology, Dr. Burr Medico again today and decision has been made for full comfort measures. Consult chaplain. Social work consult for residential hospice placement.   #3 severe protein calorie malnutrition/nutrition Secondary to problem #2. J-tube has been exchange per IR. Patient currently with nausea and emesis after being started on scheduled antiemetics. Patient with complaints of abdominal pain patient noted to have some abdominal distention with firmness and tightness. Osmolite has been discontinued due to issues surrounding PEG tube. Hematology/oncology, Dr. Burr Medico to discuss with patient concerning possibility of starting TPN for nutrition due to issues surrounding PEG tube site and metastatic malignant gastric cancer. TPN started 08/13/2017. TPN being weaned off. Will discontinue TPN on discharge as patient is now full comfort measures.  #4 hydronephrosis secondary to ascites Patient status post paracenteses with 1.4 L of serous fluid removed 08/10/2017. Renal function stable. Urine output not recorded. Renal function stable. Abdomen distended firm and tight. Patient noted to have recurrent malignant ascites and underwent ultrasound-guided therapeutic paracentesis 08/15/2017 with some improvement with abdominal discomfort. Patient has been seen by her oncologist again today, Dr. Burr Medico will discuss with the family and decision for full comfort measures. Patient will need a residential hospice home. Social work consultation placed for residential hospice home. Awaiting bed at [facility] place.  #5 anemia of neoplastic disease/iron deficiency anemia   patient with no overt bleeding. Hemoglobin stable at 11.9. Follow H&H. Full comfort measures.  #6 ?? J-tube malfunction Per nursing patient noted to have significant drainage noted around insertion site of PEG tube and skin. Per nursing gauze dressing has been significantly soaked. Osmolite tube  feeds have been on hold and not resumed.  Patient was assessed by IR and underwent G-tube injection to rule out obstruction or fistula formation 08/12/2017. It was noted that jejunostomy catheter was appropriately positioned and functioning, findings worrisome for small bowel obstruction with reflux of contrast along catheter tract to opacify a markedly dilated loop of more proximal small bowel favored to represent duodenal. Given the change jejunostomy catheter was placed to Foley catheter. TPN for nutrition started on 08/13/2017. Per Dr. Burr Medico, hematology/oncology general surgery and IR have been notified to reassess the patient. See #7.  #7 prognosis Patient with a poor prognosis. Patient with metastatic gastric cancer to the peritoneum and malignant ascites. Patient with probable small bowel obstruction as well as with ongoing nausea vomiting. Patient with feculent drainage from PEG tube site. Patient with no significant improvement. Patient's oncologist, Dr. Burr Medico met with the family and decision has been made for full comfort measures. Social work consult placed for residential hospice home. Hopefully patient will be able to go to [facility] when a bed is available.   DVT prophylaxis: Lovenox Code Status: DO NOT RESUSCITATE per Dr. Ernestina Penna note from 08/12/2017 Family Communication: Updated patient and family at bedside.  Disposition Plan: residential hospice when bed available hopefully tomorrow.   Consultants:   Palliative care Dr.Anwar 08/09/2017  Gen. surgery Dr.Gerkin 08/09/2017  Oncology Dr. Burr Medico 08/08/2017  Procedures:   CT abdomen and pelvis 08/06/2017  Abdominal films 08/07/2017  Fluoroscopic guided replacement of jejunostomy tube 08/10/2017 per Dr. Pascal Lux  Ultrasound-guided paracentesis yielding 1.4 L of serous fluid per Dr. Pascal Lux 08/10/2017  DG abdomen PEG tube location 08/09/2017  Fluoroscopic guided injection of existing jejunostomy catheter per Dr. Pascal Lux  08/12/2017  Antimicrobials:   None   Subjective: Patient with less nausea today. Patient denies any emesis. Patient denies any significant abdominal  pain at this time.  Objective: Vitals:   08/15/17 1525 08/15/17 2045 08/16/17 0623 08/16/17 0917  BP: (!) 138/101 129/85 116/83   Pulse: (!) 122 (!) 150 (!) 152 (!) 139  Resp: _0 Temp: 98.8 F (37.1 C) 98.8 F (37.1 C) 98.7 F (37.1 C)   TempSrc: Oral Axillary Oral   SpO2: 100% 100% 90%   Weight:      Height:        Intake/Output Summary (Last 24 hours) at 08/16/17 1336 Last data filed at 08/16/17 0600  Gross per 24 hour  Intake          2210.58 ml  Output                0 ml  Net          2210.58 ml   Filed Weights   08/12/17 0500 08/13/17 0828 08/14/17 1346  Weight: 50.3 kg (111 lb) 49.4 kg (109 lb) 49 kg (108 lb)    Examination:  General exam: NAD. Cachetic. Frail. Respiratory system: Clear to auscultation anterior lung fields. No wheezes, no crackles, no rhonchi.  Cardiovascular system: Tachycardia, no murmurs rubs or gallops. No pedal edema.  Gastrointestinal system: Abdomen is distended, less firm, some tenderness to palpation, hypoactive bowel sounds. Feeding tube with Foley with feculent output.  Central nervous system: Alert and oriented. No focal neurological deficits. Extremities: Symmetric 5 x 5 power. Skin: No rashes, lesions or ulcers Psychiatry: Judgement and insight appear normal. Mood & affect appropriate.     Data Reviewed: I have personally reviewed following labs and imaging studies  CBC:  Recent Labs Lab 08/10/17 0500 08/11/17 0600 08/13/17 0500 08/14/17 0614 08/15/17 0508  WBC 4.7 6.1 8.6 10.2 12.5*  NEUTROABS  --   --  6.6 8.1* 10.5*  HGB 10.5* 10.8* 11.7* 11.9* 11.8*  HCT 33.5* 33.9* 35.7* 33.7* 34.9*  MCV 87.9 89.2 93.5 95.2 93.1  PLT 462* 437* 488* 419* 630   Basic Metabolic Panel:  Recent Labs Lab 08/10/17 0500 08/11/17 0600 08/12/17 0500 08/13/17 0500  08/13/17 1610 08/14/17 0614 08/15/17 0508  NA 143 144 146* 145  --  140 138  K 3.7 3.6 3.8 3.7  --  3.5 4.0  CL 106 106 108 107  --  104 103  CO2 _1 --  25 25  GLUCOSE 99 111* 103* 150*  --  166* 183*  BUN _2 --  11 14  CREATININE 0.54 0.52 0.51 0.42*  --  0.44 0.43*  CALCIUM 8.5* 8.2* 8.5* 8.6*  --  8.3* 8.3*  MG 2.0  --   --   --  1.9 2.0 2.0  PHOS  --   --   --   --  3.1 3.3 3.3   GFR: Estimated Creatinine Clearance: 70.9 mL/min (A) (by C-G formula based on SCr of 0.43 mg/dL (L)). Liver Function Tests:  Recent Labs Lab 08/13/17 1610 08/14/17 0614 08/15/17 0508  AST 34 31 27  ALT _3 ALKPHOS 78 78 79  BILITOT 0.8 0.3 0.2*  PROT 6.2* 6.3* 6.1*  ALBUMIN 2.5* 2.5* 2.4*   No results for input(s): LIPASE, AMYLASE in the last 168 hours. No results for input(s): AMMONIA in the last 168 hours. Coagulation Profile: No results for input(s): INR, PROTIME in the last 168 hours. Cardiac Enzymes: No results for input(s): CKTOTAL, CKMB, CKMBINDEX, TROPONINI in the last 168 hours. BNP (last 3  results) No results for input(s): PROBNP in the last 8760 hours. HbA1C: No results for input(s): HGBA1C in the last 72 hours. CBG:  Recent Labs Lab 08/15/17 0557 08/15/17 1235 08/15/17 1834 08/16/17 0014 08/16/17 0621  GLUCAP 159* 146* 129* 144* 155*   Lipid Profile:  Recent Labs  08/14/17 0614 08/15/17 0508  TRIG 375* 289*   Thyroid Function Tests: No results for input(s): TSH, T4TOTAL, FREET4, T3FREE, THYROIDAB in the last 72 hours. Anemia Panel: No results for input(s): VITAMINB12, FOLATE, FERRITIN, TIBC, IRON, RETICCTPCT in the last 72 hours. Sepsis Labs: No results for input(s): PROCALCITON, LATICACIDVEN in the last 168 hours.  Recent Results (from the past 240 hour(s))  Culture, body fluid-bottle     Status: None   Collection Time: 08/06/17  8:23 PM  Result Value Ref Range Status   Specimen Description FLUID PERITONEAL  Final   Special  Requests   Final    BOTTLES DRAWN AEROBIC ONLY Blood Culture results may not be optimal due to an excessive volume of blood received in culture bottles   Culture   Final    NO GROWTH 5 DAYS Performed at [facility], [place] [address], [place], [place] [address]    Report Status 08/12/2017 FINAL  Final  Gram stain     Status: None   Collection Time: 08/06/17  8:23 PM  Result Value Ref Range Status   Specimen Description FLUID PERITONEAL  Final   Special Requests NONE  Final   Gram Stain   Final    FEW WBC PRESENT,BOTH PMN AND MONONUCLEAR NO ORGANISMS SEEN RESULT CALLED TO, READ BACK BY AND VERIFIED WITH: B.JESSEE,RN 0145 08/07/17 M.CAMPBELL Performed at [facility], [place] [address], [place], [place] [address]    Report Status 08/08/2017 FINAL  Final  Gram stain     Status: None   Collection Time: 08/06/17 11:36 PM  Result Value Ref Range Status   Specimen Description PERITONEAL CAVITY  Final   Special Requests Normal  Final   Gram Stain   Final    CYTOSPIN SMEAR WBC PRESENT,BOTH PMN AND MONONUCLEAR NO ORGANISMS SEEN RESULT CALLED TO, READ BACK BY AND VERIFIED WITH: A MARQUEZ,RN _0  08/07/17 MKELLY    Report Status 08/07/2017 FINAL  Final  Culture, blood (Routine x 2)     Status: None   Collection Time: 08/07/17  6:07 PM  Result Value Ref Range Status   Specimen Description BLOOD RIGHT HAND  Final   Special Requests   Final    BOTTLES DRAWN AEROBIC ONLY Blood Culture adequate volume   Culture   Final    NO GROWTH 5 DAYS Performed at [facility], [place] [address], [place], [place] [address]    Report Status 08/13/2017 FINAL  Final         Radiology Studies: US Abdomen Limited  Result Date: 08/14/2017 CLINICAL DATA:  Abdominal distension. EXAM: LIMITED ABDOMEN ULTRASOUND FOR ASCITES TECHNIQUE: Limited ultrasound survey for ascites was performed in all four abdominal quadrants. COMPARISON:  None. FINDINGS: Moderate ascites noted in all 4 quadrants.  IMPRESSION: Moderate ascites. Electronically Signed   By: Titus Dubin M.D.   On: 08/14/2017 17:49   US Paracentesis  Result Date: 08/15/2017 INDICATION: Patient with history of metastatic gastric adenocarcinoma. Recurrent abdominal ascites. Request is made for therapeutic paracentesis. EXAM: ULTRASOUND GUIDED THERAPEUTIC PARACENTESIS MEDICATIONS: 2% xylocaine COMPLICATIONS: None immediate. PROCEDURE: Informed written consent was obtained from the patient after a discussion of the risks, benefits and  alternatives to treatment. A timeout was performed prior to the initiation of the procedure. Initial ultrasound scanning demonstrates a small amount of ascites within the right upper abdominal quadrant. The right upper abdomen was prepped and draped in the usual sterile fashion. 2% xylocaine was used for local anesthesia. Following this, a 19 gauge, 7-cm, Yueh catheter was introduced. An ultrasound image was saved for documentation purposes. The paracentesis was performed. The catheter was removed and a dressing was applied. The patient tolerated the procedure well without immediate post procedural complication. FINDINGS: A total of approximately 1.8 L of serous fluid was removed. IMPRESSION: Successful ultrasound-guided paracentesis yielding 1.8 liters of peritoneal fluid. Read by: Saverio Danker, PA-C Electronically Signed   By: Lucrezia Europe M.D.   On: 08/15/2017 14:20   Dg Abd 2 Views  Result Date: 08/14/2017 CLINICAL DATA:  Abdominal distension. Gastric cancer. Jejunostomy tube. EXAM: ABDOMEN - 2 VIEW COMPARISON:  08/12/2017 jejunostomy fluoroscopy study. 08/10/2017 abdominal radiographs. FINDINGS: Jejunostomy tube tip appears stable in position in the left lower abdomen. Oral contrast is present within markedly dilated small bowel loops in the central abdomen, which appear increased in caliber in the interval. Minimal colonic stool. No evidence of pneumatosis or pneumoperitoneum. IMPRESSION: Stable position  of left lower abdominal jejunostomy tube. Worsening markedly dilated small bowel loops in the central abdomen containing oral contrast, suspicious for worsening mid to distal small bowel obstruction, as suggested on the 08/12/2017 interventional radiology report from the fluoroscopic jejunostomy tube evaluation. Electronically Signed   By: Ilona Sorrel M.D.   On: 08/14/2017 18:00        Scheduled Meds: . enoxaparin (LOVENOX) injection  40 mg Subcutaneous QHS  . fentaNYL  12.5 mcg Transdermal Q72H  . lidocaine-prilocaine   Topical Once  . metoprolol tartrate  5 mg Intravenous Q8H  . metoprolol tartrate  5 mg Intravenous Once  . prochlorperazine  10 mg Intravenous Q6H   Continuous Infusions: . Marland KitchenTPN (CLINIMIX-E) Adult 65 mL/hr at 08/15/17 1808  . Marland KitchenTPN (CLINIMIX-E) Adult     And  . fat emulsion    . sodium chloride 10 mL/hr at 08/14/17 1829  . chlorproMAZINE (THORAZINE) IV    . ondansetron (ZOFRAN) IV Stopped (08/15/17 1958)     LOS: 8 days    Time spent: 20 minutes    THOMPSON,DANIEL, MD [facility] Pager [phone]  If 7PM-7AM, please contact night-coverage [email or web address] Password [email or web address] 08/16/2017, 1:36 PM

## 2017-08-16 NOTE — Clinical Social Work Note (Signed)
Clinical Social Work Assessment  Patient Details  Name: Claire Bray MRN: [id number] Date of Birth: Nov 07, 1976  Date of referral:  08/16/17               Reason for consult:  End of Life/Hospice                Permission sought to share information with:  Family Supports Permission granted to share information::  Yes, Verbal Permission Granted  Name::     husband Suezanne Jacquet, sister Teacher, early years/pre::     Relationship::     Contact Information:     Housing/Transportation Living arrangements for the past 2 months:  Single Family Home Source of Information:  Patient, Spouse, Other (Comment Required) (sister) Patient Interpreter Needed:  None Criminal Activity/Legal Involvement Pertinent to Current Situation/Hospitalization:  No - Comment as needed Significant Relationships:  Dependent Children, Spouse, Siblings, Other Family Members Lives with:  Minor Children, Spouse Do you feel safe going back to the place where you live?  Yes Need for family participation in patient care:  No (Coment)  Care giving concerns:  Pt from home where she resides with her husband and 2 children. Is patient of [facility]. Pt declining and wishing to pursue comfort care.    Social Worker assessment / plan:  CSW consulted for assistance with linking pt to hospice. Met with pt and husband at bedside. At their request spoke with sister via phone also. They advise after much discussion with oncologist they are ready to pursue residential hospice care. Prefer [facility] due to proximity to family. Feel anywhere outside of [place] would be too far as pt has small children at home. Made referral to [facility]- will follow  Employment status:  Unemployed Insurance information:  Medicaid In [place] PT Recommendations:  Not assessed at this time Information / Referral to community resources:   (hospice residential)  Patient/Family's Response to care:  Appreciative of care  Patient/Family's Understanding of and  Emotional Response to Diagnosis, Current Treatment, and Prognosis:  Pt and husband demonstrate adequate understanding of pt's prognosis and options. Seem sad but accepting. Pt states, "I'm tired, I'm just ready to leave the hospital." Both are pleasant and express thanks for hospital care  Emotional Assessment Appearance:  Appears stated age Attitude/Demeanor/Rapport:   (drowsy but pleasant) Affect (typically observed):  Accepting, Calm Orientation:  Oriented to Self, Oriented to Place, Oriented to  Time, Oriented to Situation Alcohol / Substance use:  Not Applicable Psych involvement (Current and /or in the community):  No (Comment)  Discharge Needs  Concerns to be addressed:  Discharge Planning Concerns Readmission within the last 30 days:  No Current discharge risk:  None Barriers to Discharge:   (still assessing)   Nila Nephew, LCSW 08/16/2017, 9:47 AM  [phone]

## 2017-08-16 NOTE — Progress Notes (Signed)
[facility]   Received request from [place] for family interest in [facility]. Chart reviewed and spoke with patient's sister by phone to confirm interest and offer to answer questions. She is aware [facility] does not have a room to offer this afternoon. [facility] liaison will follow up with CSW tomorrow regarding availability. Appreciate report from Palliative Medicine NP Wadie Lessen.   Thank you, Erling Conte, LCSW [phone]

## 2017-08-16 NOTE — Progress Notes (Signed)
PHARMACY - ADULT TOTAL PARENTERAL NUTRITION CONSULT NOTE   Pharmacy Consult for TPN Indication: metastatic gastric cancer, SBO  Patient Measurements: Height: 5' 4.75" (164.5 cm) Weight: 108 lb (49 kg) IBW/kg (Calculated) : 56.43 TPN AdjBW (KG): 49.9 Body mass index is 18.11 kg/m.  Current Nutrition: clear liquid diet ordered. Tube feeds currently off   IVF: 1/2NS at 10 ml/hr  Central access: implanted port TPN start date: 08/13/2017  ASSESSMENT                                                                                                          HPI:   36 y/oF with PMH of metastatic adenocarcinoma of the stomach, with feeding tube, admitted on 9/30 for persistent nausea, vomiting, and abdominal pain. Patient underwent J-tube exchange and paracentesis per IR on 10/3. Since exchange, feculent material draining at J-tube insertion site and tube feeds stopped. 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 Pharmacy asked to start TPN.   Significant events:  10/8: paracentesis 1.8L; ordered CT scan, pelvic U/S, gastric emptying study 10/9: status change to comfort care, continue TPN, discontinue labs, correction scale  Today:   Glucose - No Hx DM, CBGs slightly above goal since starting TPN (range 127-159, goal 100-150); no low CBGs  Electrolytes - WNL, corr Ca 9.5  Renal - SCr low at baseline, stable  LFTs - Albumin low, otherwise WNL  TGs -289 (10/8)  Prealbumin - 8.6 (10/7)  NUTRITIONAL GOALS                                                                                             RD recs (10/4): 1500-1700 Kcal/day, 70-80 g protein/day  Clinimix 5/15 at a goal rate of 65 ml/hr + 20% fat emulsion at 20 ml/hr x 12h a day to provide: 78g/day protein, 1588 Kcal/day.  PLAN                                                                                                                           At 1800 today:  Continue Clinimix E 5/15 at goal rate of 65  ml/hr.   20% fat emulsion at  20 ml/hr over 12h daily  TPN to contain standard multivitamins and trace elements.  Transitioning to Comfort care, plan residential hospice  Will reduce TPN tomorrow if patient remains.  Minda Ditto PharmD Pager [phone] 08/16/2017, 11:46 AM

## 2017-08-16 NOTE — Progress Notes (Signed)
Nutrition Brief Note  Chart reviewed. Pt now transitioning to comfort care.  No further nutrition interventions warranted at this time.   Royal Vandevoort, MS, RD, LDN Pager: [phone] After Hours Pager: [phone]    

## 2017-08-17 DIAGNOSIS — R0609 Other forms of dyspnea: Secondary | ICD-10-CM

## 2017-08-17 DIAGNOSIS — R0600 Dyspnea, unspecified: Secondary | ICD-10-CM

## 2017-08-17 DIAGNOSIS — G893 Neoplasm related pain (acute) (chronic): Secondary | ICD-10-CM

## 2017-08-17 MED ORDER — METOPROLOL TARTRATE 5 MG/5ML IV SOLN
5.0000 mg | Freq: Once | INTRAVENOUS | Status: AC
  Administered 2017-08-17: 5 mg via INTRAVENOUS
  Filled 2017-08-17: qty 5

## 2017-08-17 MED ORDER — MORPHINE SULFATE (PF) 4 MG/ML IV SOLN
2.0000 mg | INTRAVENOUS | Status: DC | PRN
  Administered 2017-08-17 – 2017-08-19 (×14): 2 mg via INTRAVENOUS
  Filled 2017-08-17 (×14): qty 1

## 2017-08-17 MED ORDER — LIP MEDEX EX OINT
TOPICAL_OINTMENT | CUTANEOUS | Status: AC
  Filled 2017-08-17: qty 7

## 2017-08-17 NOTE — Progress Notes (Signed)
Patient ID: Claire Bray, female   DOB: Aug 05, 1976, 41 y.o.   MRN: [id number]  This NP visited patient at the bedside at request of nursing staff 2/2 to patient's  significant physical change and decline.  Decision has been made to defer transfer to hospice facility 2/2 transitioning at EOL and not stable for transfer.  Patient is minimally responsive and tachypneic,  nursing currently placing a foley cath for comfort.   Meet with family in conference room to include husband, two young children and  several sister.  Family refused use of mobile interpreter.  I shared with the family that the patient was transitioning at end of life and that time is limited. I explained utilization of medication for symptom management. I offered to contact [employer].  Female family members tell me "we have everything we need".   Medication adjustment for symptom management - Morphine 2 mg IV every 1 hour when necessary for pain or dyspnea -Ativan 1 mg IV every 4 hours when necessary for agitation -Robinul 0.2 mg IV every 4 hours when necessary for terminal secretions  Prognosis is likely hours           Emotional support offered.   Time in 1000          Time out 1035    Total time spent on the unit was  35 minutes  Discussed with Dr Grandville Silos and nursing team  Greater than 50% of the time was spent in counseling and coordination of care  Wadie Lessen NP  Palliative Medicine Team Team Phone # [phone] Pager [phone]

## 2017-08-17 NOTE — Progress Notes (Signed)
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 Per record review, pt has completed three cycles of chemo with FOLFOX. Pt was just seen in ED three days PTA for same concern, at that time she had Ct abd done which was notable for hydronephrosis due to mass effect from progressive malignant ascites, ? omental and pelvic peritoneal metastatic disease, ? Cervical mass or metastatic disease. Patient had 3 L paracentesis done, felt better and was discharged home.  Patient with no significant improvement during this hospitalization with a worsening course and probable recurrent ascites and small bowel obstruction.   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 J-tube study done negative for any leakage. Patient subsequently underwent exchange of J-tube per IR 08/10/2017 per discussions with Dr.Feng  and Dr. Barry Dienes. Oncology holding off on Pleurx placement now due to increased risk for peritonitis.  Per oncology patient will likely need weekly therapeutic paracentesis. Patient with worsening abdominal pain, abdomen more firm and more distended with further  nausea and emesis despite scheduled Compazine. PEG tube site with some feculent drainage around insertion site. IR and general surgery to reassess that site.  IR assessed J-tube under fluoroscopy with concerns for small bowel obstruction and as such J-tube was placed to Foley catheter. Continue scheduled Compazine, current pain regimen. Continue Duragesic patch. Abdominal films with worsening mid to distal small bowel obstruction. Oncology, Dr. Burr Medico has met with family and patient with a significantly poor prognosis. Decision has been made for full comfort measures. Social work consult for residential hospice placement. Patient with agonal breaths, likely hospital death.  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 Patient status post J-tube exchange per IR as noted in problem #1. Patient status post paracenteses per interventional radiology 08/10/2017 uterine 1.4 L of serous fluid. Patient has been started on a fentanyl patch for pain management.  Patient still nauseous with some emesis. PEG tube site with less drainage with a feculent odor a few days ago and currently now as a foley to gravity.. Per hematology/oncology IR and general surgery to assess PEG site. Patient with worsening significant abdominal distention and firmness and now with further nausea and vomiting with drainage noted around PEG tube site on 08/12/2017.  Concern for possible reaccumulation of malignant ascites. Hematology/oncology holding off on pleurx catheter placement due to risk of peritonitis. Patient will likely require weekly therapeutic paracentesis done per oncology who will arrange for them. Continue supportive care. IR assessed J-tube and there was concern for small bowel obstruction and as such J-tube placed to Foley catheter. TPN started 08/13/2017 per oncology recommendations. Oncology following and appreciate  input and recommendations.  Abdominal ultrasound with reaccumulation of ascites. Patient underwent therapeutic paracentesis today with 1.8 L removed.  Patient has been assessed by oncology, Dr. Burr Medico again today and decision has been made for full comfort measures. Consult chaplain. Social work consult for residential hospice placement.  Patient with continued deterioration now with agonal breaths and now full comfort measures. Patient will likely have a hospital death.  #3 severe protein calorie malnutrition/nutrition Secondary to problem #2. J-tube has been exchange per IR. Patient currently with nausea and emesis after being started on scheduled antiemetics. Patient with complaints of abdominal pain patient noted to have some abdominal distention with firmness and tightness. Osmolite has been discontinued due to issues surrounding PEG tube. Hematology/oncology, Dr. Burr Medico to discuss with patient concerning possibility of starting TPN for nutrition due to issues surrounding PEG tube site and metastatic malignant gastric cancer. TPN started 08/13/2017. TPN being weaned off. Will discontinue TPN on discharge as patient is now full comfort measures. Likely hospital death.  #4 hydronephrosis secondary to ascites Patient status post paracenteses with 1.4 L of serous fluid removed 08/10/2017. Renal function stable. Urine output not recorded. Renal function stable. Abdomen distended firm and tight. Patient noted to have recurrent malignant ascites and underwent ultrasound-guided therapeutic paracentesis 08/15/2017 with some improvement with abdominal discomfort. Patient has been seen by her oncologist again today, Dr. Burr Medico will discuss with the family and decision for full comfort measures. Patient will need a residential hospice home. Social work consultation placed for residential hospice home. Patient deteriorated now with agonal breaths likely will have a hospital death.  #5 anemia of neoplastic disease/iron deficiency anemia   patient  with no overt bleeding. Hemoglobin stable at 11.9. Follow H&H. Full comfort measures.  #6 ?? J-tube malfunction Per nursing patient noted to have significant drainage noted around insertion site of PEG tube and skin. Per nursing gauze dressing has been significantly soaked. Osmolite tube feeds have been on hold and not resumed.  Patient was assessed by IR and underwent G-tube injection to rule out obstruction or fistula formation 08/12/2017. It was noted that jejunostomy catheter was appropriately positioned and functioning, findings worrisome for small bowel obstruction with reflux of contrast along catheter tract to opacify a markedly dilated loop of more proximal small bowel favored to represent duodenal. Given the change jejunostomy catheter was placed to Foley catheter. TPN for nutrition started on 08/13/2017. Per Dr. Burr Medico, hematology/oncology general surgery and IR have been notified to reassess the patient. See #7.  #7 prognosis Patient with a poor prognosis. Patient with metastatic gastric cancer to the peritoneum and malignant ascites. Patient with probable small bowel obstruction as well as with ongoing nausea vomiting. Patient with feculent drainage from PEG tube site. Patient with no significant improvement. Patient's oncologist, Dr. Burr Medico met with the family and decision has been made for full comfort measures. Social work consult placed for residential hospice home. Patient now with agonal breaths. Patient minimally responsive. Patient likely will have a hospital death. Continue comfort measures.   DVT prophylaxis: Lovenox Code Status: DO NOT RESUSCITATE per Dr. Ernestina Penna note from 08/12/2017 Family Communication: Updated patient and family at bedside.  Disposition Plan: Likely hospital death.    Consultants:   Palliative care Dr.Anwar 08/09/2017  Gen. surgery Dr.Gerkin 08/09/2017  Oncology Dr. Burr Medico 08/08/2017  Procedures:   CT abdomen and pelvis 08/06/2017  Abdominal films  08/07/2017  Fluoroscopic guided replacement of jejunostomy tube 08/10/2017 per Dr. Pascal Lux  Ultrasound-guided paracentesis yielding 1.4 L of serous fluid  per Dr. Pascal Lux 08/10/2017  DG abdomen PEG tube location 08/09/2017  Fluoroscopic guided injection of existing jejunostomy catheter per Dr. Pascal Lux 08/12/2017  Antimicrobials:   None   Subjective: Patient with abdominal breaths. Minimally responsive. Family at bedside.   Objective: Vitals:   08/16/17 0917 08/16/17 1528 08/16/17 1953 08/17/17 0551  BP:  112/78 (!) 132/91 119/76  Pulse: (!) 139 (!) 130 (!) 143 (!) 151  Resp:  18 20 20   Temp:  99.6 F (37.6 C) 99.4 F (37.4 C) 99.4 F (37.4 C)  TempSrc:  Oral Oral Oral  SpO2:  (!) 84% (!) 88% 90%  Weight:    50.8 kg (112 lb)  Height:       No intake or output data in the 24 hours ending 08/17/17 1946 Filed Weights   08/13/17 0828 08/14/17 1346 08/17/17 0551  Weight: 49.4 kg (109 lb) 49 kg (108 lb) 50.8 kg (112 lb)    Examination:  General exam: NAD. Cachetic. Frail. Respiratory system: Agonal breaths.  Cardiovascular system: Tachycardia, no murmurs rubs or gallops. No pedal edema.  Gastrointestinal system: Abdomen is distended, firm, some tenderness to palpation, hypoactive bowel sounds. Feeding tube with Foley with feculent output.  Central nervous system: Alert and oriented. No focal neurological deficits. Extremities: Symmetric 5 x 5 power. Skin: No rashes, lesions or ulcers Psychiatry: Judgement and insight appear normal. Mood & affect appropriate.     Data Reviewed: I have personally reviewed following labs and imaging studies  CBC:  Recent Labs Lab 08/11/17 0600 08/13/17 0500 08/14/17 0614 08/15/17 0508  WBC 6.1 8.6 10.2 12.5*  NEUTROABS  --  6.6 8.1* 10.5*  HGB 10.8* 11.7* 11.9* 11.8*  HCT 33.9* 35.7* 33.7* 34.9*  MCV 89.2 93.5 95.2 93.1  PLT 437* 488* 419* 915   Basic Metabolic Panel:  Recent Labs Lab 08/11/17 0600 08/12/17 0500 08/13/17 0500  08/13/17 1610 08/14/17 0614 08/15/17 0508  NA 144 146* 145  --  140 138  K 3.6 3.8 3.7  --  3.5 4.0  CL 106 108 107  --  104 103  CO2 25 26 28   --  25 25  GLUCOSE 111* 103* 150*  --  166* 183*  BUN 15 15 11   --  11 14  CREATININE 0.52 0.51 0.42*  --  0.44 0.43*  CALCIUM 8.2* 8.5* 8.6*  --  8.3* 8.3*  MG  --   --   --  1.9 2.0 2.0  PHOS  --   --   --  3.1 3.3 3.3   GFR: Estimated Creatinine Clearance: 73.5 mL/min (A) (by C-G formula based on SCr of 0.43 mg/dL (L)). Liver Function Tests:  Recent Labs Lab 08/13/17 1610 08/14/17 0614 08/15/17 0508  AST 34 31 27  ALT 17 15 15   ALKPHOS 78 78 79  BILITOT 0.8 0.3 0.2*  PROT 6.2* 6.3* 6.1*  ALBUMIN 2.5* 2.5* 2.4*   No results for input(s): LIPASE, AMYLASE in the last 168 hours. No results for input(s): AMMONIA in the last 168 hours. Coagulation Profile: No results for input(s): INR, PROTIME in the last 168 hours. Cardiac Enzymes: No results for input(s): CKTOTAL, CKMB, CKMBINDEX, TROPONINI in the last 168 hours. BNP (last 3 results) No results for input(s): PROBNP in the last 8760 hours. HbA1C: No results for input(s): HGBA1C in the last 72 hours. CBG:  Recent Labs Lab 08/15/17 0557 08/15/17 1235 08/15/17 1834 08/16/17 0014 08/16/17 0621  GLUCAP 159* 146* 129* 144* 155*   Lipid Profile:  Recent Labs  08/15/17 0508  TRIG 289*   Thyroid Function Tests: No results for input(s): TSH, T4TOTAL, FREET4, T3FREE, THYROIDAB in the last 72 hours. Anemia Panel: No results for input(s): VITAMINB12, FOLATE, FERRITIN, TIBC, IRON, RETICCTPCT in the last 72 hours. Sepsis Labs: No results for input(s): PROCALCITON, LATICACIDVEN in the last 168 hours.  No results found for this or any previous visit (from the past 240 hour(s)).       Radiology Studies: No results found.      Scheduled Meds: . lidocaine-prilocaine   Topical Once  . lip balm      . metoprolol tartrate  5 mg Intravenous Once  . prochlorperazine  10  mg Intravenous Q6H   Continuous Infusions: . sodium chloride 10 mL/hr at 08/14/17 1829  . chlorproMAZINE (THORAZINE) IV    . ondansetron (ZOFRAN) IV Stopped (08/15/17 1958)     LOS: 9 days    Time spent: 24 minutes    THOMPSON,DANIEL, MD [facility] Pager [phone]  If 7PM-7AM, please contact night-coverage [email or web address] Password [email or web address] 08/17/2017, 7:46 PM

## 2017-08-17 NOTE — Progress Notes (Signed)
   08/17/17 1300  Clinical Encounter Type  Visited With Patient and family together  Visit Type Follow-up;Psychological support;Spiritual support;Patient actively dying  Referral From Nurse  Consult/Referral To Chaplain  Spiritual Encounters  Spiritual Needs Ritual;Prayer;Emotional;Grief support;Other (Comment) (Spiritual Conversation/Support)  Stress Factors  Patient Stress Factors Not reviewed  Family Stress Factors Loss;Major life changes   I followed up with the family per request by the family and nurse. The patient is actively dying and the family requested a [place] to visit.  A large group of friends and family were present at the bedside. The patient's husband was very tearful and dealing with grief. The patient's 2 children were present on the unit and I spent a good deal of time with them to relieve the adult friends and family during the Priest's prayer and blessing.  I provided support for the patient's friends and family; particularly around grief.   Please, contact Spiritual [facility] for further assistance.   [place] M.Div.

## 2017-08-18 DIAGNOSIS — R140 Abdominal distension (gaseous): Secondary | ICD-10-CM

## 2017-08-18 NOTE — Progress Notes (Signed)
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 Per record review, pt has completed three cycles of chemo with FOLFOX. Pt was just seen in ED three days PTA for same concern, at that time she had Ct abd done which was notable for hydronephrosis due to mass effect from progressive malignant ascites, ? omental and pelvic peritoneal metastatic disease, ? Cervical mass or metastatic disease. Patient had 3 L paracentesis done, felt better and was discharged home.  Patient with no significant improvement during this hospitalization with a worsening course and probable recurrent ascites and small bowel obstruction.   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   Oncology, Dr. Burr Medico has met with family and patient with a significantly poor prognosis. Decision has been made for full comfort measures. Social work consult for residential hospice placement. Patient with agonal breaths, likely hospital death.  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 Patient status post J-tube  exchange per IR as noted in problem #1. Patient status post paracenteses per interventional radiology 08/10/2017 uterine 1.4 L of serous fluid. Patient has been started on a fentanyl patch for pain management.   Abdominal ultrasound with reaccumulation of ascites. Patient underwent therapeutic paracentesis today with 1.8 L removed.  Patient has been assessed by oncology, Dr. Burr Medico again and decision has been made for full comfort measures.    #3 severe protein calorie malnutrition/nutrition   #4 hydronephrosis secondary to ascites  Social work consultation placed for residential hospice home. Patient deteriorated now with agonal breaths likely will have a hospital death.  #5 anemia of neoplastic disease/iron deficiency anemia   patient with no overt bleeding. Hemoglobin stable at 11.9. Follow H&H. Full comfort measures.  #6 ?? J-tube malfunction  .  #7 prognosis Patient with a poor prognosis. Patient with metastatic gastric cancer to the peritoneum and malignant ascites. Patient with probable small bowel obstruction as well as with ongoing nausea vomiting. Patient with feculent drainage from PEG tube site. Patient with no significant improvement. Patient's oncologist, Dr. Burr Medico met with the family and decision has been made for full comfort measures. Social work consult placed for residential hospice home. Patient now with agonal breaths. Patient minimally responsive. Patient likely will have a hospital death. Continue comfort measures.   DVT prophylaxis: Lovenox Code Status: DO NOT RESUSCITATE per Dr. Ernestina Penna note from 08/12/2017 Family Communication: Updated patient and family at bedside.  Disposition Plan: Likely hospital death.    Consultants:   Palliative care Dr.Anwar 08/09/2017  Gen. surgery Dr.Gerkin 08/09/2017  Oncology Dr. Burr Medico 08/08/2017  Procedures:   CT abdomen and pelvis 08/06/2017  Abdominal  films 08/07/2017  Fluoroscopic guided replacement of jejunostomy tube  08/10/2017 per Dr. Pascal Lux  Ultrasound-guided paracentesis yielding 1.4 L of serous fluid per Dr. Pascal Lux 08/10/2017  DG abdomen PEG tube location 08/09/2017  Fluoroscopic guided injection of existing jejunostomy catheter per Dr. Pascal Lux 08/12/2017  Antimicrobials:   None   Subjective:  Patient is minimally responsive and tachypneic,  nursing currently placing a foley cath for comfort  Objective: Vitals:   08/16/17 1528 08/16/17 1953 08/17/17 0551 08/18/17 1130  BP: 112/78 (!) 132/91 119/76 106/77  Pulse: (!) 130 (!) 143 (!) 151 (!) 121  Resp: _0 Temp: 99.6 F (37.6 C) 99.4 F (37.4 C) 99.4 F (37.4 C) 98.3 F (36.8 C)  TempSrc: Oral Oral Oral Axillary  SpO2: (!) 84% (!) 88% 90% (!) 88%  Weight:   50.8 kg (112 lb)   Height:        Intake/Output Summary (Last 24 hours) at 08/18/17 1431 Last data filed at 08/18/17 0536  Gross per 24 hour  Intake                0 ml  Output              500 ml  Net             -500 ml   Filed Weights   08/13/17 0828 08/14/17 1346 08/17/17 0551  Weight: 49.4 kg (109 lb) 49 kg (108 lb) 50.8 kg (112 lb)    Examination:  General exam: NAD. Cachetic. Frail. Respiratory system: Agonal breaths.  Cardiovascular system: Tachycardia, no murmurs rubs or gallops. No pedal edema.  Gastrointestinal system: Abdomen is distended, firm, some tenderness to palpation, hypoactive bowel sounds. Feeding tube with Foley with feculent output.  Central nervous system: Alert and oriented. No focal neurological deficits. Extremities: Symmetric 5 x 5 power.      Data Reviewed: I have personally reviewed following labs and imaging studies  CBC:  Recent Labs Lab 08/13/17 0500 08/14/17 0614 08/15/17 0508  WBC 8.6 10.2 12.5*  NEUTROABS 6.6 8.1* 10.5*  HGB 11.7* 11.9* 11.8*  HCT 35.7* 33.7* 34.9*  MCV 93.5 95.2 93.1  PLT 488* 419* 179   Basic Metabolic Panel:  Recent Labs Lab 08/12/17 0500 08/13/17 0500 08/13/17 1610 08/14/17 0614  08/15/17 0508  NA 146* 145  --  140 138  K 3.8 3.7  --  3.5 4.0  CL 108 107  --  104 103  CO2 26 28  --  25 25  GLUCOSE 103* 150*  --  166* 183*  BUN 15 11  --  11 14  CREATININE 0.51 0.42*  --  0.44 0.43*  CALCIUM 8.5* 8.6*  --  8.3* 8.3*  MG  --   --  1.9 2.0 2.0  PHOS  --   --  3.1 3.3 3.3   GFR: Estimated Creatinine Clearance: 73.5 mL/min (A) (by C-G formula based on SCr of 0.43 mg/dL (L)). Liver Function Tests:  Recent Labs Lab 08/13/17 1610 08/14/17 0614 08/15/17 0508  AST 34 31 27  ALT _1 ALKPHOS 78 78 79  BILITOT 0.8 0.3 0.2*  PROT 6.2* 6.3* 6.1*  ALBUMIN 2.5* 2.5* 2.4*   No results for input(s): LIPASE, AMYLASE in the last 168 hours. No results for input(s): AMMONIA in the last 168 hours. Coagulation Profile: No results for input(s): INR, PROTIME in the last 168 hours. Cardiac Enzymes: No results for input(s): CKTOTAL, CKMB, CKMBINDEX, TROPONINI in the  last 168 hours. BNP (last 3 results) No results for input(s): PROBNP in the last 8760 hours. HbA1C: No results for input(s): HGBA1C in the last 72 hours. CBG:  Recent Labs Lab 08/15/17 0557 08/15/17 1235 08/15/17 1834 08/16/17 0014 08/16/17 0621  GLUCAP 159* 146* 129* 144* 155*   Lipid Profile: No results for input(s): CHOL, HDL, LDLCALC, TRIG, CHOLHDL, LDLDIRECT in the last 72 hours. Thyroid Function Tests: No results for input(s): TSH, T4TOTAL, FREET4, T3FREE, THYROIDAB in the last 72 hours. Anemia Panel: No results for input(s): VITAMINB12, FOLATE, FERRITIN, TIBC, IRON, RETICCTPCT in the last 72 hours. Sepsis Labs: No results for input(s): PROCALCITON, LATICACIDVEN in the last 168 hours.  No results found for this or any previous visit (from the past 240 hour(s)).       Radiology Studies: No results found.      Scheduled Meds: . lidocaine-prilocaine   Topical Once  . metoprolol tartrate  5 mg Intravenous Once  . prochlorperazine  10 mg Intravenous Q6H   Continuous  Infusions: . sodium chloride 10 mL/hr at 08/14/17 1829  . chlorproMAZINE (THORAZINE) IV    . ondansetron (ZOFRAN) IV Stopped (08/15/17 1958)     LOS: 10 days    Time spent: 30 minutes    Reyne Dumas, MD [facility] Pager [phone]  If 7PM-7AM, please contact night-coverage [email or web address] Password [email or web address] 08/18/2017, 2:31 PM

## 2017-08-19 ENCOUNTER — Ambulatory Visit: Payer: Medicaid Other

## 2017-08-19 DIAGNOSIS — R0602 Shortness of breath: Secondary | ICD-10-CM

## 2017-08-19 MED ORDER — MORPHINE BOLUS VIA INFUSION
2.0000 mg | INTRAVENOUS | Status: DC | PRN
  Administered 2017-08-20 – 2017-08-21 (×12): 2 mg via INTRAVENOUS
  Filled 2017-08-19: qty 2

## 2017-08-19 MED ORDER — SODIUM CHLORIDE 0.9 % IV SOLN
0.5000 mg/h | INTRAVENOUS | Status: DC
  Administered 2017-08-19: 0.5 mg/h via INTRAVENOUS
  Administered 2017-08-20: 1 mg/h via INTRAVENOUS
  Filled 2017-08-19 (×2): qty 10

## 2017-08-19 NOTE — Progress Notes (Signed)
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 Per record review, pt has completed three cycles of chemo with FOLFOX. Pt was just seen in ED three days PTA for same concern, at that time she had Ct abd done which was notable for hydronephrosis due to mass effect from progressive malignant ascites, ? omental and pelvic peritoneal metastatic disease, ? Cervical mass or metastatic disease. Patient had 3 L paracentesis done, felt better and was discharged home.  Patient with no significant improvement during this hospitalization with a worsening course and probable recurrent ascites and small bowel obstruction.   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 J-tube study done negative for any leakage. Patient subsequently underwent exchange of J-tube per IR 08/10/2017 per discussions with Dr.Feng  and Dr. Barry Dienes. Oncology holding off on Pleurx placement now due to increased risk for peritonitis.  Per oncology patient will likely need weekly therapeutic paracentesis. Patient with worsening abdominal pain, abdomen more firm and more distended with further  nausea and emesis despite scheduled Compazine. PEG tube site with some feculent drainage around insertion site. IR and general surgery to reassess that site.  IR assessed J-tube under fluoroscopy with concerns for small bowel obstruction and as such J-tube was placed to Foley catheter. Continue scheduled Compazine, current pain regimen. Continue Duragesic patch. Abdominal films with worsening mid to distal small bowel obstruction. Oncology, Dr. Burr Medico has met with family and patient with a significantly poor prognosis. Decision has been made for full comfort measures. Social work consulted for residential hospice placement. Patient with agonal breaths, likely hospital death.  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 Patient status post J-tube exchange per IR as noted in problem #1. Patient status post paracenteses per interventional radiology 08/10/2017 uterine 1.4 L of serous fluid. Patient has been started on a fentanyl patch for pain management.  Patient still nauseous with some emesis. PEG tube site with less drainage with a feculent odor a few days ago and currently now as a foley to gravity.. Per hematology/oncology IR and general surgery to assess PEG site. Patient with worsening significant abdominal distention and firmness and now with further nausea and vomiting with drainage noted around PEG tube site on 08/12/2017.  Concern for possible reaccumulation of malignant ascites. Hematology/oncology holding off on pleurx catheter placement due to risk of peritonitis. Patient will likely require weekly therapeutic paracentesis done per oncology who will arrange for them. Continue supportive care. IR assessed J-tube and there was concern for small bowel obstruction and as such J-tube placed to Foley catheter. TPN started 08/13/2017 per oncology recommendations. Oncology following and appreciate  input and recommendations.  Abdominal ultrasound with reaccumulation of ascites. Patient underwent therapeutic paracentesis today with 1.8 L removed.  Patient has been assessed by oncology, Dr. Burr Medico again today and decision has been made for full comfort measures. Consult chaplain. Social work consult for residential hospice placement.  Patient with continued deterioration now with agonal breaths and now full comfort measures. Patient will likely have a hospital death.  #3 severe protein calorie malnutrition/nutrition Secondary to problem #2. J-tube has been exchange per IR. Patient currently with nausea and emesis after being started on scheduled antiemetics. Patient with complaints of abdominal pain patient noted to have some abdominal distention with firmness and tightness. Osmolite has been discontinued due to issues surrounding PEG tube. Hematology/oncology, Dr. Burr Medico to discuss with patient concerning possibility of starting TPN for nutrition due to issues surrounding PEG tube site and metastatic malignant gastric cancer. TPN started 08/13/2017. TPN has been discontinued. Patient now full comfort measures. Likely hospital death.  #4 hydronephrosis secondary to ascites Patient status post paracenteses with 1.4 L of serous fluid removed 08/10/2017. Renal function stable. Urine output not recorded. Renal function stable. Abdomen distended firm and tight. Patient noted to have recurrent malignant ascites and underwent ultrasound-guided therapeutic paracentesis 08/15/2017 with some improvement with abdominal discomfort. Patient has been seen by her oncologist again today, Dr. Burr Medico will discuss with the family and decision for full comfort measures. Patient will need a residential hospice home. Social work consultation placed for residential hospice home. Patient deteriorated now with agonal breaths likely will have a hospital death. Palliative care following for pain management and comfort measures.  #5 anemia of neoplastic  disease/iron deficiency anemia   patient with no overt bleeding. Hemoglobin stable at 11.9. Follow H&H. Full comfort measures.  #6 ?? J-tube malfunction Per nursing patient noted to have significant drainage noted around insertion site of PEG tube and skin. Per nursing gauze dressing has been significantly soaked. Osmolite tube feeds have been on hold and not resumed.  Patient was assessed by IR and underwent G-tube injection to rule out obstruction or fistula formation 08/12/2017. It was noted that jejunostomy catheter was appropriately positioned and functioning, findings worrisome for small bowel obstruction with reflux of contrast along catheter tract to opacify a markedly dilated loop of more proximal small bowel favored to represent duodenal. Given the change jejunostomy catheter was placed to Foley catheter. TPN for nutrition started on 08/13/2017. Per Dr. Burr Medico, hematology/oncology general surgery and IR have been notified to reassess the patient. See #7.  #7 prognosis Patient with a poor prognosis. Patient with metastatic gastric cancer to the peritoneum and malignant ascites. Patient with probable small bowel obstruction as well as with ongoing nausea vomiting. Patient with feculent drainage from PEG tube site. Patient with no significant improvement. Patient's oncologist, Dr. Burr Medico met with the family and decision has been made for full comfort measures. Social work consult placed for residential hospice home. Patient with agonal breaths. Patient unresponsive. Patient likely will have a hospital death. Continue comfort measures. Palliative care following.   DVT prophylaxis: Lovenox Code Status: DO NOT RESUSCITATE per Dr. Ernestina Penna note from 08/12/2017 Family Communication: Updated patient and family at bedside.  Disposition Plan: Likely hospital death.    Consultants:   Palliative care Dr.Anwar 08/09/2017  Gen. surgery Dr.Gerkin 08/09/2017  Oncology Dr. Burr Medico 08/08/2017  Procedures:    CT abdomen and pelvis 08/06/2017  Abdominal films 08/07/2017  Fluoroscopic guided replacement of jejunostomy tube 08/10/2017 per Dr. Pascal Lux  Ultrasound-guided paracentesis yielding 1.4 L  of serous fluid per Dr. Pascal Lux 08/10/2017  DG abdomen PEG tube location 08/09/2017  Fluoroscopic guided injection of existing jejunostomy catheter per Dr. Pascal Lux 08/12/2017  Antimicrobials:   None   Subjective: Patient with agonal breaths. Unresponsive. Family at bedside.   Objective: Vitals:   08/16/17 1953 08/17/17 0551 08/18/17 1130 08/18/17 2341  BP: (!) 132/91 119/76 106/77 106/74  Pulse: (!) 143 (!) 151 (!) 121 (!) 120  Resp: _0 Temp: 99.4 F (37.4 C) 99.4 F (37.4 C) 98.3 F (36.8 C) 98.6 F (37 C)  TempSrc: Oral Oral Axillary Axillary  SpO2: (!) 88% 90% (!) 88% 90%  Weight:  50.8 kg (112 lb)    Height:        Intake/Output Summary (Last 24 hours) at 08/19/17 1852 Last data filed at 08/18/17 2353  Gross per 24 hour  Intake                0 ml  Output              500 ml  Net             -500 ml   Filed Weights   08/13/17 0828 08/14/17 1346 08/17/17 0551  Weight: 49.4 kg (109 lb) 49 kg (108 lb) 50.8 kg (112 lb)    Examination:  General exam: NAD. Cachetic. Frail.Nonresponsive Respiratory system: Agonal breaths.  Cardiovascular system: Tachycardia, no murmurs rubs or gallops. No pedal edema.  Gastrointestinal system: Abdomen is distended, firm, non tender to palpation, hypoactive bowel sounds. Feeding tube with Foley with feculent output.  Central nervous system: Nonresponsive. No focal neurological deficits. Extremities: Symmetric 5 x 5 power. Skin: No rashes, lesions or ulcers Psychiatry: Unable to assess due to current mental status.    Data Reviewed: I have personally reviewed following labs and imaging studies  CBC:  Recent Labs Lab 08/13/17 0500 08/14/17 0614 08/15/17 0508  WBC 8.6 10.2 12.5*  NEUTROABS 6.6 8.1* 10.5*  HGB 11.7* 11.9*  11.8*  HCT 35.7* 33.7* 34.9*  MCV 93.5 95.2 93.1  PLT 488* 419* 588   Basic Metabolic Panel:  Recent Labs Lab 08/13/17 0500 08/13/17 1610 08/14/17 0614 08/15/17 0508  NA 145  --  140 138  K 3.7  --  3.5 4.0  CL 107  --  104 103  CO2 28  --  25 25  GLUCOSE 150*  --  166* 183*  BUN 11  --  11 14  CREATININE 0.42*  --  0.44 0.43*  CALCIUM 8.6*  --  8.3* 8.3*  MG  --  1.9 2.0 2.0  PHOS  --  3.1 3.3 3.3   GFR: Estimated Creatinine Clearance: 73.5 mL/min (A) (by C-G formula based on SCr of 0.43 mg/dL (L)). Liver Function Tests:  Recent Labs Lab 08/13/17 1610 08/14/17 0614 08/15/17 0508  AST 34 31 27  ALT _1 ALKPHOS 78 78 79  BILITOT 0.8 0.3 0.2*  PROT 6.2* 6.3* 6.1*  ALBUMIN 2.5* 2.5* 2.4*   No results for input(s): LIPASE, AMYLASE in the last 168 hours. No results for input(s): AMMONIA in the last 168 hours. Coagulation Profile: No results for input(s): INR, PROTIME in the last 168 hours. Cardiac Enzymes: No results for input(s): CKTOTAL, CKMB, CKMBINDEX, TROPONINI in the last 168 hours. BNP (last 3 results) No results for input(s): PROBNP in the last 8760 hours. HbA1C: No results for input(s): HGBA1C in the last 72 hours. CBG:  Recent Labs Lab  08/15/17 0557 08/15/17 1235 08/15/17 1834 08/16/17 0014 08/16/17 0621  GLUCAP 159* 146* 129* 144* 155*   Lipid Profile: No results for input(s): CHOL, HDL, LDLCALC, TRIG, CHOLHDL, LDLDIRECT in the last 72 hours. Thyroid Function Tests: No results for input(s): TSH, T4TOTAL, FREET4, T3FREE, THYROIDAB in the last 72 hours. Anemia Panel: No results for input(s): VITAMINB12, FOLATE, FERRITIN, TIBC, IRON, RETICCTPCT in the last 72 hours. Sepsis Labs: No results for input(s): PROCALCITON, LATICACIDVEN in the last 168 hours.  No results found for this or any previous visit (from the past 240 hour(s)).       Radiology Studies: No results found.      Scheduled Meds: . lidocaine-prilocaine   Topical  Once  . metoprolol tartrate  5 mg Intravenous Once  . prochlorperazine  10 mg Intravenous Q6H   Continuous Infusions: . sodium chloride 10 mL/hr at 08/14/17 1829  . chlorproMAZINE (THORAZINE) IV    . morphine 0.5 mg/hr (08/19/17 1547)  . ondansetron (ZOFRAN) IV Stopped (08/15/17 1958)     LOS: 11 days    Time spent: 20 minutes    Claire Dziuba, MD [facility] Pager [phone]  If 7PM-7AM, please contact night-coverage [email or web address] Password [facility] 08/19/2017, 6:52 PM

## 2017-08-19 NOTE — Progress Notes (Signed)
Haldol 0.5 mg  Given IV  For agitation

## 2017-08-19 NOTE — Progress Notes (Signed)
[facility] Liaison:  RN  Offered [facility] bed to patient.  Per Meghan, LCSW, patient is unstable to transfer at this time per MD.    Thank you for the referral.  Edyth Gunnels, RN, BSN [facility] Liaison [phone]  All hospital liaisons are now on [place].

## 2017-08-19 NOTE — Progress Notes (Signed)
Current dressing  Over G/j tube was changed. Current dressing was saturated through multiple split gauze and  several  ABD pads. I redressed  With 6 split gauze and 6 ABD pads. Her skin was clean prior to applying a  new dressing

## 2017-08-19 NOTE — Progress Notes (Signed)
Patient ID: Claire Bray, female   DOB: [birth year], 41 y.o.   MRN: [id number]  Palliative care progress note  Reason for visit: Shortness of breath, terminal care  I met today at bedside with patient's husband and also discussed with her sister via phone.  Patient is unresponsive but appears comfortable on my exam.  Nursing reports she becomes intermittently restless and agitated.  We reviewed plan for comfort, and they both agree that she has periods where she becomes restless and uncomfortable.  We discussed utilization of continuous infusion rather than continued intermittent dosing.  Family has expressed concern about hastening death with medications, and so we reviewed goal with medications and that we would start continuous infusion rate based on her need over the last 24 hours.  Her use over the past 24 hours has been 20m of morphine.  Will therefore start rate at 0.510m which will be 1250mer 24 hour period if no further titration is needed.  Prognosis is likely hours.  Discussed with Dr ThoGrandville Silosd nursing team  Total time: 30 minutes Greater than 50% of the time was spent in counseling and coordination of care  GenMicheline RoughD Con[place]villeam [phone]

## 2017-08-20 DIAGNOSIS — R0600 Dyspnea, unspecified: Secondary | ICD-10-CM

## 2017-08-20 MED ORDER — MORPHINE SULFATE (CONCENTRATE) 10 MG/0.5ML PO SOLN: 10.0000 mg | ORAL | 0 refills | Status: DC | PRN

## 2017-08-20 MED ORDER — HALOPERIDOL 0.5 MG PO TABS: 0.5000 mg | ORAL_TABLET | ORAL | Status: DC | PRN

## 2017-08-20 MED ORDER — GLYCOPYRROLATE 1 MG PO TABS: 1.0000 mg | ORAL_TABLET | ORAL | Status: DC | PRN

## 2017-08-20 MED ORDER — LORAZEPAM 2 MG/ML PO CONC: 1.0000 mg | Freq: Four times a day (QID) | ORAL | 0 refills | Status: DC | PRN

## 2017-08-20 NOTE — Progress Notes (Signed)
Pts g-tube came out.  The inflatable bubble was out with heavy foul drainage pouring out from around the site. Paged dr. Myna Hidalgo  And was advised to push the g-tube back in. Will continue to monitor.

## 2017-08-20 NOTE — Progress Notes (Signed)
Site continued to leak heavily. Removed the g-tube. Attached an ostomy bag. Will continue to monitor.

## 2017-08-20 NOTE — Progress Notes (Signed)
Patient ID: Claire Bray, female   DOB: 12-11-1975, 41 y.o.   MRN: [id number]  Palliative care progress note  Reason for visit: Shortness of breath, terminal care  I met today at bedside with patient's husband and sister.  Patient is unresponsive but appears comfortable on my exam.    Reviewed PRN usage.  Currently on 65m/hr of morphine.  Has also had 2 rescue doses overnight. Plan to continue same.  Total time: 15 minutes Greater than 50% of the time was spent in counseling and coordination of care  GMicheline Rough MD CEl [place]itanTeam [phone]

## 2017-08-20 NOTE — Clinical Social Work Note (Addendum)
CSW followed up with Farrel Gordon at [facility] [phone]) who stated no [facility] beds available today, but may have opening tomorrow. CSW updated Dr. Domingo Cocking (Palliative) and Dr. Candiss Norse.   3:25pm: CSW received a call from Farrel Gordon at [facility] [phone]) who stated they anticipate a bed opening tomorrow morning. Bevely Palmer coming to [facility] to complete paperwork with family today. CSW continuing to follow for family support and discharge needs.   Oretha Ellis, [place], [place] Work- [employer] Coverage [phone]

## 2017-08-20 NOTE — Discharge Instructions (Signed)
Discharge to residential hospice once but a little  Diet for comfortable liquid diet as tolerated, with feeding assistance and aspiration precautions.  Activity as tolerated.  Prognosis. Guarded

## 2017-08-20 NOTE — Discharge Summary (Signed)
Claire Bray Claire Bray [name] DOB: 12-19-1975 DOA: 08/07/2017  PCP: Patient, No Pcp Per  Admit date: 08/07/2017  Discharge date: 08/20/2017  Admitted From: Home   Disposition:  Residential Hospice   Recommendations for Outpatient Follow-up:   Follow up with PCP in 1-2 weeks  PCP Please obtain BMP/CBC, 2 view CXR in 1week,  (see Discharge instructions)   PCP Please follow up on the following pending results:     Home Health: None Equipment/Devices: None  Consultations: Onc, Pall care Discharge Condition: Guarded  CODE STATUS: DNR  Diet Recommendation: Diet clear liquid or comfort with feeding assistance and aspiration precautions   Chief Complaint  Patient presents with  . Emesis  . 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 Per record review, pt has completed three cycles of chemo with FOLFOX. Pt was just seen in ED three days PTA for same concern, at that time she had Ct abd done which was notable for hydronephrosis due to mass effect from progressive malignant ascites, ? omental and pelvic peritoneal metastatic disease, ? Cervical mass or metastatic disease. Patient had 3 L paracentesis done, felt better and was discharged home. Patient with no significant improvement during this hospitalization with a worsening course and probable recurrent ascites and small bowel obstruction.                                                                  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 Burr Medico who suggested that patient's prognosis is extremely poor and that full comfort measures should be considered, she was also seen by palliative care, she is currently on IV morphine and full comfort measures, prognosis is she lowers to few days. Goal of Now is comfort only. At this point we will look for residential hospice. Case discussed with patient's husband bedside. Patient will be discharged to residential hospice once bed is arranged and if she survives until that time.    Other Medical Issues addressed this admission    #1 refractory nausea/ vomiting/ abdominal pain/ small bowel obstruction Likely secondary to metastatic gastric cancer to the peritoneum with malignant ascites. J-tube study done negative for any leakage. Patient subsequently underwent exchange of J-tube per IR 08/10/2017 per discussions with Dr.Feng  and Dr. Barry Dienes. Oncology holding  off on Pleurx placement now due to increased risk for peritonitis.  Per oncology patient will likely need weekly therapeutic paracentesis. Patient with worsening abdominal pain, abdomen more firm and more distended with further nausea and emesis despite scheduled Compazine. PEG tube site with some feculent drainage around insertion site. IR and general surgery to reassess that site.  IR assessed J-tube under fluoroscopy with concerns for small bowel obstruction and as such J-tube was placed to Foley catheter. Continue scheduled Compazine, current pain regimen. Continue Duragesic patch. Abdominal films with worsening mid to distal small bowel obstruction. Oncology, Dr. Burr Medico has met with family and patient with a significantly poor prognosis. Decision has been made for full  comfort measures. Social work consulted for residential hospice placement. Patient with agonal breaths, likely hospital death.  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 Patient status post J-tube exchange per IR as noted in problem #1. Patient status post paracenteses per interventional radiology 08/10/2017 uterine 1.4 L of serous fluid. Patient has been started on a fentanyl patch for pain management.  Patient still nauseous with some emesis. PEG tube site with less drainage with a feculent odor a few days ago and currently now as a foley to gravity.. Per hematology/oncology IR and general surgery to assess PEG site. Patient with worsening significant abdominal distention and firmness and now with further nausea and vomiting with drainage noted around PEG tube site on 08/12/2017.  Concern for possible reaccumulation of malignant ascites. Hematology/oncology holding off on pleurx catheter placement due to risk of peritonitis. Patient will likely require weekly therapeutic paracentesis done per oncology who will arrange for them. Continue supportive care. IR assessed J-tube and there was concern for small bowel obstruction and as such J-tube placed to Foley catheter. TPN started 08/13/2017 per oncology recommendations. Oncology following and appreciate input and recommendations. Abdominal ultrasound with reaccumulation of ascites. Patient underwent therapeutic paracentesis today with 1.8 L removed.  Patient has been assessed by oncology, Dr. Burr Medico again today and decision has been made for full comfort measures. Consult chaplain. Social work consult for residential hospice placement.  Patient with continued deterioration now with agonal breaths and now full comfort measures. Patient will likely have a hospital death.  #3 severe protein calorie malnutrition/nutrition Secondary to problem #2. J-tube has been exchange per  IR. Patient currently with nausea and emesis after being started on scheduled antiemetics. Patient with complaints of abdominal pain patient noted to have some abdominal distention with firmness and tightness. Osmolite has been discontinued due to issues surrounding PEG tube. Hematology/oncology, Dr. Burr Medico to discuss with patient concerning possibility of starting TPN for nutrition due to issues surrounding PEG tube site and metastatic malignant gastric cancer. TPN started 08/13/2017. TPN has been discontinued. Patient now full comfort measures. Likely hospital death.  #4 hydronephrosis secondary to ascites Patient status post paracenteses with 1.4 L of serous fluid removed 08/10/2017. Renal function stable. Urine output not recorded. Renal function stable. Abdomen distended firm and tight. Patient noted to have recurrent malignant ascites and underwent ultrasound-guided therapeutic paracentesis 08/15/2017 with some improvement with abdominal discomfort. Patient has been seen by her oncologist again today, Dr. Burr Medico will discuss with the family and decision for full comfort measures. Patient will need a residential hospice home. Social work consultation placed for residential hospice home. Patient deteriorated now with agonal breaths likely will have a hospital death. Palliative care following for pain management and comfort measures.  #5 anemia of  neoplastic disease/iron deficiency anemia   patient with no overt bleeding. Hemoglobin stable at 11.9. Follow H&H. Full comfort measures.  #6 ?? J-tube malfunction Per nursing patient noted to have significant drainage noted around insertion site of PEG tube and skin. Per nursing gauze dressing has been significantly soaked. Osmolite tube feeds have been on hold and not resumed.  Patient was assessed by IR and underwent G-tube injection to rule out obstruction or fistula formation 08/12/2017. It was noted that jejunostomy catheter was appropriately positioned  and functioning, findings worrisome for small bowel obstruction with reflux of contrast along catheter tract to opacify a markedly dilated loop of more proximal small bowel favored to represent duodenal. Given the change jejunostomy catheter was placed to Foley catheter. TPN for nutrition started on 08/13/2017. Per Dr. Burr Medico, hematology/oncology general surgery and IR have been notified to reassess the patient. See #7.  #7 prognosis Patient with a poor prognosis. Patient with metastatic gastric cancer to the peritoneum and malignant ascites. Patient with probable small bowel obstruction as well as with ongoing nausea vomiting. Patient with feculent drainage from PEG tube site. Patient with no significant improvement. Patient's oncologist, Dr. Burr Medico met with the family and decision has been made for full comfort measures. Social work consult placed for residential hospice home. Patient with agonal breaths. Patient unresponsive. Patient likely will have a hospital death. Continue comfort measures. Palliative care following.   Discharge diagnosis     Principal Problem:   Refractory nausea and vomiting Active Problems:   Gastric cancer (HCC)   Protein-calorie malnutrition, severe   Anemia in neoplastic disease   Ascites   Intractable vomiting with nausea   Metastasis from gastric cancer [facility])   Palliative care by specialist   Dehydration   Failure to thrive in adult   Jejunostomy tube leak (HCC)   SBO (small bowel obstruction) (HCC)   Abdominal distension   Cancer associated pain   Dyspnea    Discharge instructions    Discharge Instructions    Discharge instructions    Complete by:  As directed    Discharge to residential hospice once but a little  Diet for comfortable liquid diet as tolerated, with feeding assistance and aspiration precautions.  Activity as tolerated.  Prognosis. Guarded      Discharge Medications   Allergies as of 08/20/2017   No Known Allergies       Medication List    STOP taking these medications   feeding supplement (OSMOLITE 1.2 CAL) Liqd   HYDROcodone-acetaminophen 5-325 MG tablet Commonly known as:  NORCO/VICODIN   ibuprofen 600 MG tablet Commonly known as:  ADVIL,MOTRIN   metoCLOPramide 5 MG tablet Commonly known as:  REGLAN   polyethylene glycol packet Commonly known as:  MIRALAX   promethazine 25 MG suppository Commonly known as:  PHENERGAN   zolpidem 5 MG tablet Commonly known as:  AMBIEN     TAKE these medications   dicyclomine 20 MG tablet Commonly known as:  BENTYL Take 1 tablet (20 mg total) by mouth 2 (two) times daily.   glycopyrrolate 1 MG tablet Commonly known as:  ROBINUL Take 1 tablet (1 mg total) by mouth every 4 (four) hours as needed (excessive secretions).   haloperidol 0.5 MG tablet Commonly known as:  HALDOL Take 1 tablet (0.5 mg total) by mouth every 4 (four) hours as needed for agitation (or delirium).   lidocaine-prilocaine cream Commonly known as:  EMLA Apply to portacath site 1 hour prior to use   LORazepam 2  MG/ML concentrated solution Commonly known as:  ATIVAN Take 0.5 mLs (1 mg total) by mouth every 6 (six) hours as needed for anxiety.   morphine CONCENTRATE 10 MG/0.5ML Soln concentrated solution Take 0.5 mLs (10 mg total) by mouth every 3 (three) hours as needed for moderate pain or severe pain.   ondansetron 8 MG tablet Commonly known as:  ZOFRAN Take 1 tablet (8 mg total) by mouth every 8 (eight) hours as needed for nausea or vomiting.   pantoprazole 40 MG tablet Commonly known as:  PROTONIX Take 1 tablet (40 mg total) by mouth daily at 12 noon.         Major procedures and Radiology Reports - PLEASE review detailed and final reports thoroughly  -         Dg Chest 1 View  Result Date: 07/31/2017 CLINICAL DATA:  Shortness of breath with lower central chest pain and abdominal pain 3 days. EXAM: CHEST 1 VIEW COMPARISON:  04/20/2017 FINDINGS: Left subclavian  Port-A-Cath with tip over the SVC. Lungs are adequately inflated without consolidation or effusion. Cardiomediastinal silhouette and remainder of the exam is within normal. IMPRESSION: No acute cardiopulmonary disease. Electronically Signed   By: Marin Olp M.D.   On: 07/31/2017 21:55   Ct Chest W Contrast  Result Date: 08/01/2017 CLINICAL DATA:  Fever, nausea and vomiting x2 days. Patient is on chemotherapy for gastric cancer and has a gastrostomy placed 04/20/2017. EXAM: CT CHEST, ABDOMEN, AND PELVIS WITH CONTRAST TECHNIQUE: Multidetector CT imaging of the chest, abdomen and pelvis was performed following the standard protocol during bolus administration of intravenous contrast. CONTRAST:  80 cc Isovue-300 IV COMPARISON:  Chest CT 04/19/2017, CT abdomen and pelvis reportedly from 04/02/2017 is not available on PACs within this system. FINDINGS: CT CHEST FINDINGS Cardiovascular: Normal heart size. No significant pericardial effusion or thickening. Great vessels are normal in course and caliber. No large central pulmonary embolus. Mediastinum/Nodes: Reflux of contrast within the esophagus to the distal third versus delayed emptying. No mediastinal or hilar lymphadenopathy. Tiny subcentimeter left thyroid lobe nodule is unchanged. Lungs/Pleura: Trace bibasilar effusions and atelectasis. No pulmonary consolidations. No dominant mass. Musculoskeletal: No aggressive focal osseous lesions. CT ABDOMEN PELVIS FINDINGS Hepatobiliary: No biliary dilatation or space-occupying mass of the liver. Contracted body and fundus of the gallbladder. No radiopaque calculi. Pancreas: Normal without ductal dilatation or mass. Spleen: Normal size.  No mass. Adrenals/Urinary Tract: Punctate interpolar left renal calculus. No enhancing renal masses. Normal bilateral adrenal glands. The urinary bladder is physiologically distended. Stomach/Bowel: Circumferential thickening of the gastric body and antrum consistent with known gastric  neoplasm. Contrast passes antegrade into large bowel. A percutaneous jejunostomy tube is noted with tip within small bowel in the right hemiabdomen. Vascular/Lymphatic: No significant vascular findings are present. No enlarged abdominal or pelvic lymph nodes. Reproductive: Normal appearing uterus. There is a somewhat circumscribed right adnexal cystic mass with eccentric thickening along the right lateral periphery measuring 6.7 x 5.8 x 6.9 cm. An ovarian mass is not excluded. The left ovary is normal. Other: Large volume of ascites, new since prior exam. No omental thickening or definite nodularity of the peritoneum. Musculoskeletal: No aggressive appearing lesions. IMPRESSION: 1. Large volume of ascites, new since prior exam. No omental caking or pleural nodularity identified. 2. Right adnexal cystic masslike abnormality with peripheral mural thickening along its right lateral order measuring 6.7 x 5.8 x 6.9 cm. This may be of ovarian etiology and further characterization with ultrasound may prove useful. 3. Moderate-to-marked mural  thickening of the distal body and antrum of the stomach consistent with history of gastric cancer. 4. Jejunostomy tube tip is noted within small bowel loops in the right hemiabdomen. 5. Retained contrast in the upper esophagus. Reflux is not excluded versus delayed esophageal emptying. 6. Trace bilateral pleural effusions and atelectasis. Electronically Signed   By: Ashley Royalty M.D.   On: 08/01/2017 01:44   Ct Abdomen Pelvis W Contrast  Result Date: 08/06/2017 CLINICAL DATA:  Nausea and vomiting. Abdominal distention. Undergoing chemotherapy for gastric cancer. EXAM: CT ABDOMEN AND PELVIS WITH CONTRAST TECHNIQUE: Multidetector CT imaging of the abdomen and pelvis was performed using the standard protocol following bolus administration of intravenous contrast. CONTRAST:  80 cc Isovue-300 COMPARISON:  08/01/2017 FINDINGS: Lower chest: Clear lung bases. Hepatobiliary: Progressive  flattening of the lateral margins of the liver by the adjacent ascites. Contracted gallbladder. Pancreas: Flattened pancreatic margins by the ascites. Spleen: Flattened margins by the ascites. Adrenals/Urinary Tract: 2 small left renal calculi. Interval mild to moderate dilatation of the right renal collecting system and proximal ureter with delayed excretion of contrast compared to the left. No obstructing calculus seen. Minimal urine in the bladder. The bladder is compressed by the large amount of ascites. Normal appearing adrenal glands. Stomach/Bowel: Diffuse low-density gastric wall thickening without significant change. This remains most pronounced involving the antrum. Multiple dilated loops of proximal jejunum with mild progression. A percutaneous jejunal catheter remains in place. Normal caliber colon and distal small bowel. Vascular/Lymphatic: Mildly prominent retroperitoneal lymph nodes without significant change. No arterial calcifications are aneurysm. Reproductive: Heterogeneous mass-like enlargement of the cervix, measuring 7.1 x 4.3 cm on image number 83 of series 2. The uterus is also mildly heterogeneous and located in the pelvis on the left. There is more ill-defined, heterogeneous, soft tissue density in the posterior aspect of the pelvic ascites with one area measuring 6.5 x 3.7 cm on image number 73 of series 2 and another measuring 2.9 x 1.2 cm on image number 74 of series 2. The latter area may represent the right ovary. The left ovary is grossly unremarkable. Other: Large amount of abdominal and pelvic ascites, mildly increased. There is associated centralization of bowel loops. There is also omental heterogeneous soft tissue density currently. Anteriorly on the right, this measures 9.1 x 3.2 cm on image number 44 of series 2 on the left, this measures 3.1 x 1.6 cm on image number 42 of series 2. Musculoskeletal: Mild lumbar spine degenerative changes. IMPRESSION: 1. Interval mild to  moderate right hydronephrosis and proximal right hydroureter. This is concerning for ureteral obstruction by mass effect produced by the patient's large amount of ascites. 2. Mildly progressive malignant ascites in the abdomen pelvis with mass effect, as described above. 3. 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 4. Possible cervical mass or metastatic disease obscuring the cervix. 5. Stable low-density gastric wall thickening compatible with the patient's known gastric malignancy. 6. Two small, nonobstructing left renal calculi. 7. Borderline enlarged retroperitoneal lymph nodes. Electronically Signed   By: Claudie Revering M.D.   On: 08/06/2017 19:56   Ct Abdomen Pelvis W Contrast  Result Date: 08/01/2017 CLINICAL DATA:  Fever, nausea and vomiting x2 days. Patient is on chemotherapy for gastric cancer and has a gastrostomy placed 04/20/2017. EXAM: CT CHEST, ABDOMEN, AND PELVIS WITH CONTRAST TECHNIQUE: Multidetector CT imaging of the chest, abdomen and pelvis was performed following the standard protocol during bolus administration of intravenous contrast. CONTRAST:  80 cc Isovue-300 IV COMPARISON:  Chest  CT 04/19/2017, CT abdomen and pelvis reportedly from 04/02/2017 is not available on PACs within this system. FINDINGS: CT CHEST FINDINGS Cardiovascular: Normal heart size. No significant pericardial effusion or thickening. Great vessels are normal in course and caliber. No large central pulmonary embolus. Mediastinum/Nodes: Reflux of contrast within the esophagus to the distal third versus delayed emptying. No mediastinal or hilar lymphadenopathy. Tiny subcentimeter left thyroid lobe nodule is unchanged. Lungs/Pleura: Trace bibasilar effusions and atelectasis. No pulmonary consolidations. No dominant mass. Musculoskeletal: No aggressive focal osseous lesions. CT ABDOMEN PELVIS FINDINGS Hepatobiliary: No biliary dilatation or space-occupying mass of the liver. Contracted body and fundus of the  gallbladder. No radiopaque calculi. Pancreas: Normal without ductal dilatation or mass. Spleen: Normal size.  No mass. Adrenals/Urinary Tract: Punctate interpolar left renal calculus. No enhancing renal masses. Normal bilateral adrenal glands. The urinary bladder is physiologically distended. Stomach/Bowel: Circumferential thickening of the gastric body and antrum consistent with known gastric neoplasm. Contrast passes antegrade into large bowel. A percutaneous jejunostomy tube is noted with tip within small bowel in the right hemiabdomen. Vascular/Lymphatic: No significant vascular findings are present. No enlarged abdominal or pelvic lymph nodes. Reproductive: Normal appearing uterus. There is a somewhat circumscribed right adnexal cystic mass with eccentric thickening along the right lateral periphery measuring 6.7 x 5.8 x 6.9 cm. An ovarian mass is not excluded. The left ovary is normal. Other: Large volume of ascites, new since prior exam. No omental thickening or definite nodularity of the peritoneum. Musculoskeletal: No aggressive appearing lesions. IMPRESSION: 1. Large volume of ascites, new since prior exam. No omental caking or pleural nodularity identified. 2. Right adnexal cystic masslike abnormality with peripheral mural thickening along its right lateral order measuring 6.7 x 5.8 x 6.9 cm. This may be of ovarian etiology and further characterization with ultrasound may prove useful. 3. Moderate-to-marked mural thickening of the distal body and antrum of the stomach consistent with history of gastric cancer. 4. Jejunostomy tube tip is noted within small bowel loops in the right hemiabdomen. 5. Retained contrast in the upper esophagus. Reflux is not excluded versus delayed esophageal emptying. 6. Trace bilateral pleural effusions and atelectasis. Electronically Signed   By: Ashley Royalty M.D.   On: 08/01/2017 01:44   US Abdomen Limited  Result Date: 08/14/2017 CLINICAL DATA:  Abdominal distension.  EXAM: LIMITED ABDOMEN ULTRASOUND FOR ASCITES TECHNIQUE: Limited ultrasound survey for ascites was performed in all four abdominal quadrants. COMPARISON:  None. FINDINGS: Moderate ascites noted in all 4 quadrants. IMPRESSION: Moderate ascites. Electronically Signed   By: Titus Dubin M.D.   On: 08/14/2017 17:49   US Paracentesis  Result Date: 08/15/2017 INDICATION: Patient with history of metastatic gastric adenocarcinoma. Recurrent abdominal ascites. Request is made for therapeutic paracentesis. EXAM: ULTRASOUND GUIDED THERAPEUTIC PARACENTESIS MEDICATIONS: 2% xylocaine COMPLICATIONS: None immediate. PROCEDURE: Informed written consent was obtained from the patient after a discussion of the risks, benefits and alternatives to treatment. A timeout was performed prior to the initiation of the procedure. Initial ultrasound scanning demonstrates a small amount of ascites within the right upper abdominal quadrant. The right upper abdomen was prepped and draped in the usual sterile fashion. 2% xylocaine was used for local anesthesia. Following this, a 19 gauge, 7-cm, Yueh catheter was introduced. An ultrasound image was saved for documentation purposes. The paracentesis was performed. The catheter was removed and a dressing was applied. The patient tolerated the procedure well without immediate post procedural complication. FINDINGS: A total of approximately 1.8 L of serous fluid was removed. IMPRESSION:  Successful ultrasound-guided paracentesis yielding 1.8 liters of peritoneal fluid. Read by: Saverio Danker, PA-C Electronically Signed   By: Lucrezia Europe M.D.   On: 08/15/2017 14:20   US Paracentesis  Result Date: 08/01/2017 INDICATION: Gastric cancer, abdominal discomfort, mild temperature elevation, ascites. Request made for diagnostic and therapeutic paracentesis. EXAM: ULTRASOUND GUIDED DIAGNOSTIC AND THERAPEUTIC PARACENTESIS MEDICATIONS: None. COMPLICATIONS: None immediate. PROCEDURE: Informed written consent  was obtained from the patient after a discussion of the risks, benefits and alternatives to treatment. A timeout was performed prior to the initiation of the procedure. Initial ultrasound scanning demonstrates a small to moderate amount of ascites within the right upper to mid abdominal quadrant. The right upper to mid abdomen was prepped and draped in the usual sterile fashion. 2% lidocaine was used for local anesthesia. Following this, a Yueh catheter was introduced. An ultrasound image was saved for documentation purposes. The paracentesis was performed. The catheter was removed and a dressing was applied. The patient tolerated the procedure well without immediate post procedural complication. FINDINGS: A total of approximately 2.8 liters of slightly hazy, yellow fluid was removed. Samples were sent to the laboratory as requested by the clinical team. IMPRESSION: Successful ultrasound-guided diagnostic and therapeutic paracentesis yielding 2.8 liters of peritoneal fluid. Read by: Rowe Robert, PA-C Electronically Signed   By: Markus Daft M.D.   On: 08/01/2017 15:32   Ir Replc Duoden/jejuno Tube Percut W/fluoro  Result Date: 08/10/2017 INDICATION: History of gastric cancer with chronic surgically placed jejunostomy catheter. The jejunostomy catheter is currently leaking. Please perform fluoroscopic guided injection and potential exchange Recurrent malignant ascites. Please perform ultrasound-guided paracentesis. EXAM: FLUOROSCOPIC GUIDED REPLACEMENT OF JEJUNOSTOMY TUBE COMPARISON:  CT abdomen pelvis - 08/06/2017 MEDICATIONS: None CONTRAST:  50 mL ISOVUE-300 IOPAMIDOL (ISOVUE-300) INJECTION 61% - administered via the jejunostomy catheter FLUOROSCOPY TIME:  7 minutes 42 seconds (16.1 mGy) COMPLICATIONS: None immediate. PROCEDURE: Informed written consent was obtained from the patient (via the use of a medical translator) after a discussion of the risks, benefits and alternatives to treatment. Questions regarding  the procedure were encouraged and answered. A timeout was performed prior to the initiation of the procedure. Ultrasound scanning demonstrated small to moderate volume recurrent intra-abdominal ascites. The skin overlying the right mid lateral abdomen was prepped and draped in usual sterile fashion. At the overlying soft tissues were anesthetized with 1% lidocaine with epinephrine, a Yueh sheath needle was advanced into the peritoneal space. Ultrasound image was saved for procedural documentation purposes. Next, paracentesis was performed yielding 1.4 L of serous fluid. The Yueh sheath catheter was removed and a dressing was placed. Attention was now paid towards the leaking jejunostomy catheter. External portion of the existing jejunostomy catheter as well as the surrounding skin were prepped and draped in the usual sterile fashion, and a sterile drape was applied covering the operative field. Maximum barrier sterile technique with sterile gowns and gloves were used for the procedure. A preprocedural spot fluoroscopic image demonstrates abrupt kinking of the mid aspect of the existing jejunostomy catheter. Contrast injection demonstrates peripheral antral opacification of the proximal aspect of the jejunostomy catheter (additional sideholes had been cut along the length of the jejunostomy catheter as is customary with surgical placement). The external portion of jejunostomy catheter was cut and the catheter was cannulated with a stiff Glidewire which was advanced into the jejunum. A new 61 French balloon retention jejunostomy catheter was trimmed approximately and half. Next, the new shortened jejunostomy catheter was advanced over the Glidewire. The retention balloon was  inflated and the balloon was cinched. Contrast was injected and several spot fluoroscopic images were obtained in various obliquities confirming intraluminal positioning. The patient tolerated both procedures well without immediate postprocedural  complication. IMPRESSION: 1. Successful fluoroscopic guided placement of a new 18-French jejunostomy tube. The new jejunostomy tube is ready for immediate use. 2. Successful ultrasound-guided paracentesis yielding 1.4 L of serous fluid. Electronically Signed   By: Sandi Mariscal M.D.   On: 08/10/2017 16:47   Ir Cm Inj Any Colonic Tube W/fluoro  Result Date: 08/12/2017 INDICATION: History of gastric cancer with chronic surgically placed jejunostomy catheter, now with concern for persistent jejunostomy leaking. Note, patient recently underwent fluoroscopic guided jejunostomy catheter exchange on 08/10/2017. EXAM: FLUOROSCOPIC GUIDED INJECTION OF EXISTING JEJUNOSTOMY CATHETER COMPARISON:  Fluoroscopic guided jejunostomy catheter exchange - 08/10/2017; CT abdomen pelvis - 08/07/2027 MEDICATIONS: None. CONTRAST:  50 cc Isovue-300 - administered into the jejunum. FLUOROSCOPY TIME:  3 minutes (49 mGy). COMPLICATIONS: None immediate. PROCEDURE: The patient was positioned supine on the fluoroscopy table. Preprocedural spot fluoroscopic image was obtained demonstrating unchanged positioning of the recently exchange jejunostomy catheter. Contrast injection demonstrates opacification of the jejunum. Despite keeping the patient on the fluoroscopy table for prolonged period of time, the injected contrast did not past much beyond the distal end of the jejunostomy catheter. Rather, contrast refluxed retrograde along the jejunostomy tract, eventually opacifying a markedly dilated loop of more proximal small bowel, suspected to represent the descending portion of the duodenum (confirmed with positioning of the patient left lateral decubitus). IMPRESSION: 1. Appropriately positioned and functioning jejunostomy catheter. No exchange performed. 2. Findings worrisome for small bowel obstruction with reflux of contrast along the catheter tract to opacify a markedly dilated loop of more proximal small bowel favored to represent the  duodenum. This represents an interval change compared to the 08/06/2017 abdominal CT and further evaluation with repeat CT scan could be performed as indicated. PLAN: - Given above, the patient's jejunostomy catheter was placed to a Foley catheter however may be placed to wall suction as indicated. - Would consider converting the patient to TPN versus hospice/palliation as indicated. Electronically Signed   By: Sandi Mariscal M.D.   On: 08/12/2017 17:32   Dg Abdomen Peg Tube Location  Result Date: 08/09/2017 CLINICAL DATA:  41 year old female with history of jejunostomy tube. Evaluate for potential leak. EXAM: ABDOMEN - 1 VIEW COMPARISON:  Abdominal radiograph 08/07/2017. FINDINGS: Injection of indwelling jejunostomy tube was performed. All contrast material appears to be intraluminal within loops of small bowel. Dilatation of one of the loops up to 6.5 cm in diameter is noted (nonspecific). Increased density in the periphery of the abdomen bilaterally, likely reflective of underlying ascites. IMPRESSION: 1. No contrast extravasation to suggest jejunostomy tube leak. Electronically Signed   By: Vinnie Langton M.D.   On: 08/09/2017 19:32   Dg Abd 2 Views  Result Date: 08/14/2017 CLINICAL DATA:  Abdominal distension. Gastric cancer. Jejunostomy tube. EXAM: ABDOMEN - 2 VIEW COMPARISON:  08/12/2017 jejunostomy fluoroscopy study. 08/10/2017 abdominal radiographs. FINDINGS: Jejunostomy tube tip appears stable in position in the left lower abdomen. Oral contrast is present within markedly dilated small bowel loops in the central abdomen, which appear increased in caliber in the interval. Minimal colonic stool. No evidence of pneumatosis or pneumoperitoneum. IMPRESSION: Stable position of left lower abdominal jejunostomy tube. Worsening markedly dilated small bowel loops in the central abdomen containing oral contrast, suspicious for worsening mid to distal small bowel obstruction, as suggested on the 08/12/2017  interventional radiology  report from the fluoroscopic jejunostomy tube evaluation. Electronically Signed   By: Ilona Sorrel M.D.   On: 08/14/2017 18:00   Dg Abd 2 Views  Result Date: 08/10/2017 CLINICAL DATA:  Nausea and vomiting EXAM: ABDOMEN - 2 VIEW COMPARISON:  None. FINDINGS: There is retained enteric contrast in small bowel. Nonspecific air-fluid levels in small bowel are noted. There is no free intraperitoneal gas. A jejunostomy tube projects over the left hemiabdomen. IMPRESSION: No free intraperitoneal gas.  Nonobstructive bowel gas pattern. Electronically Signed   By: Marybelle Killings M.D.   On: 08/10/2017 20:06   Dg Abdomen Acute W/chest  Result Date: 08/07/2017 CLINICAL DATA:  Abdominal pain, nausea and vomiting. Malignant ascites. Lethargy. Currently being treated for gastric cancer. EXAM: DG ABDOMEN ACUTE W/ 1V CHEST COMPARISON:  Abdomen pelvis CT obtained yesterday. Chest, abdomen pelvis CT dated 08/01/2017. FINDINGS: Normal sized heart. Clear lungs. Left subclavian porta catheter tip in the inferior aspect of the superior vena cava. Again demonstrated are mildly dilated loops of jejunum with a jejunal catheter in place. These contain air-fluid levels. No free peritoneal air. There is less central is a shin of the bowel loops following large volume paracentesis yesterday. Unremarkable bones. IMPRESSION: 1. No significant change in mild jejunal ileus or partial obstruction. 2. Decreased amount of ascites following paracentesis yesterday. 3. No acute chest abnormality. Electronically Signed   By: Claudie Revering M.D.   On: 08/07/2017 19:02   Ir Paracentesis  Result Date: 08/10/2017 INDICATION: History of gastric cancer with chronic surgically placed jejunostomy catheter. The jejunostomy catheter is currently leaking. Please perform fluoroscopic guided injection and potential exchange Recurrent malignant ascites. Please perform ultrasound-guided paracentesis. EXAM: FLUOROSCOPIC GUIDED REPLACEMENT OF  JEJUNOSTOMY TUBE COMPARISON:  CT abdomen pelvis - 08/06/2017 MEDICATIONS: None CONTRAST:  50 mL ISOVUE-300 IOPAMIDOL (ISOVUE-300) INJECTION 61% - administered via the jejunostomy catheter FLUOROSCOPY TIME:  7 minutes 42 seconds (93.7 mGy) COMPLICATIONS: None immediate. PROCEDURE: Informed written consent was obtained from the patient (via the use of a medical translator) after a discussion of the risks, benefits and alternatives to treatment. Questions regarding the procedure were encouraged and answered. A timeout was performed prior to the initiation of the procedure. Ultrasound scanning demonstrated small to moderate volume recurrent intra-abdominal ascites. The skin overlying the right mid lateral abdomen was prepped and draped in usual sterile fashion. At the overlying soft tissues were anesthetized with 1% lidocaine with epinephrine, a Yueh sheath needle was advanced into the peritoneal space. Ultrasound image was saved for procedural documentation purposes. Next, paracentesis was performed yielding 1.4 L of serous fluid. The Yueh sheath catheter was removed and a dressing was placed. Attention was now paid towards the leaking jejunostomy catheter. External portion of the existing jejunostomy catheter as well as the surrounding skin were prepped and draped in the usual sterile fashion, and a sterile drape was applied covering the operative field. Maximum barrier sterile technique with sterile gowns and gloves were used for the procedure. A preprocedural spot fluoroscopic image demonstrates abrupt kinking of the mid aspect of the existing jejunostomy catheter. Contrast injection demonstrates peripheral antral opacification of the proximal aspect of the jejunostomy catheter (additional sideholes had been cut along the length of the jejunostomy catheter as is customary with surgical placement). The external portion of jejunostomy catheter was cut and the catheter was cannulated with a stiff Glidewire which was  advanced into the jejunum. A new 36 French balloon retention jejunostomy catheter was trimmed approximately and half. Next, the new shortened jejunostomy catheter  was advanced over the Glidewire. The retention balloon was inflated and the balloon was cinched. Contrast was injected and several spot fluoroscopic images were obtained in various obliquities confirming intraluminal positioning. The patient tolerated both procedures well without immediate postprocedural complication. IMPRESSION: 1. Successful fluoroscopic guided placement of a new 18-French jejunostomy tube. The new jejunostomy tube is ready for immediate use. 2. Successful ultrasound-guided paracentesis yielding 1.4 L of serous fluid. Electronically Signed   By: Sandi Mariscal M.D.   On: 08/10/2017 16:47    Micro Results     No results found for this or any previous visit (from the past 240 hour(s)).  Today   Subjective    Gaetana Irish Elders LPF today is in bed obtunded unable to provide review of systems   Objective   Blood pressure 106/74, pulse (!) 120, temperature 98.6 F (37 C), temperature source Axillary, resp. rate 18, height 5' 4.75" (1.645 m), weight 50.8 kg (112 lb), last menstrual period 07/23/2017, SpO2 90 %.   Intake/Output Summary (Last 24 hours) at 08/20/17 1157 Last data filed at 08/20/17 0400  Gross per 24 hour  Intake            846.5 ml  Output                0 ml  Net            846.5 ml    Exam  Full physical exam was deferred in the light of patient's comfort, Patient lying in bed obtunded, unable to follow commands or provide review of systems. Appears to be in no distress. Shallow breaths, no crackles good air movement bilaterally Rapid rate regular rhythm Abdomen appears formed.   Data Review   CBC w Diff: Lab Results  Component Value Date   WBC 12.5 (H) 08/15/2017   HGB 11.8 (L) 08/15/2017   HGB 11.3 (L) 07/20/2017   HCT 34.9 (L) 08/15/2017   HCT 33.8 (L) 07/20/2017   PLT 362 08/15/2017    PLT 366 07/20/2017   LYMPHOPCT 9 08/15/2017   LYMPHOPCT 16.6 07/20/2017   MONOPCT 7 08/15/2017   MONOPCT 10.9 07/20/2017   EOSPCT 0 08/15/2017   EOSPCT 1.6 07/20/2017   BASOPCT 0 08/15/2017   BASOPCT 0.2 07/20/2017    CMP: Lab Results  Component Value Date   NA 138 08/15/2017   NA 138 07/20/2017   K 4.0 08/15/2017   K 4.1 07/20/2017   CL 103 08/15/2017   CO2 25 08/15/2017   CO2 26 07/20/2017   BUN 14 08/15/2017   BUN 10.6 07/20/2017   CREATININE 0.43 (L) 08/15/2017   CREATININE 0.6 07/20/2017   PROT 6.1 (L) 08/15/2017   PROT 7.3 07/20/2017   ALBUMIN 2.4 (L) 08/15/2017   ALBUMIN 3.2 (L) 07/20/2017   BILITOT 0.2 (L) 08/15/2017   BILITOT <0.22 07/20/2017   ALKPHOS 79 08/15/2017   ALKPHOS 77 07/20/2017   AST 27 08/15/2017   AST 42 (H) 07/20/2017   ALT 15 08/15/2017   ALT 39 07/20/2017  .   Total Time in preparing paper work, data evaluation and todays exam - 107 minutes  Lala Lund M.D on 08/20/2017 at 11:57 AM  [facility]   Office  [phone]

## 2017-08-20 NOTE — Progress Notes (Signed)
[facility] Liaison: RN visit  Received request from [place], [place]  for family interest in [facility] with request for transfer as soon as possible. Chart reviewed. Met with and family to confirm interest and explain services. Family agreeable to transfer 08/21/2017. CSW aware. Registration paper work completed. Dr. Orpah Melter to assume care per family request. Please fax discharge summary to [phone]. RN please call report to [phone]. Please arrange transport for patient to arrive at 10:00 am or later.   Please call with any hospice related questions.   Thank you.  Farrel Gordon, RN, [facility] Liaison [phone]   All hospital liaisons are on [place].

## 2017-08-21 NOTE — Progress Notes (Signed)
Patient discharged to [facility]. Report called to receiving RN. Patient to be transported via [facility].

## 2017-09-08 DEATH — deceased

## 2017-09-20 ENCOUNTER — Other Ambulatory Visit: Payer: Self-pay | Admitting: Hematology

## 2018-12-14 IMAGING — RF DG UGI W/ HIGH DENSITY W/KUB
3 series · 14 of 24 positions shown · non-contrast
Comparison: No priors.

CLINICAL DATA: 40-year-old female with history of epigastric pain
and intractable nausea and vomiting after eating. Weight loss.

EXAM:
UPPER GI SERIES WITH KUB
TECHNIQUE: After obtaining a scout radiograph a routine upper GI series was
performed using thin and high density barium.
FLUOROSCOPY TIME:  Fluoroscopy Time:  5 minutes and 6 seconds
Radiation Exposure Index (if provided by the fluoroscopic device):
89 mGy

[Series 1: one shot · 11 of 20 slices shown (1 of 2)]
[im 1/20]
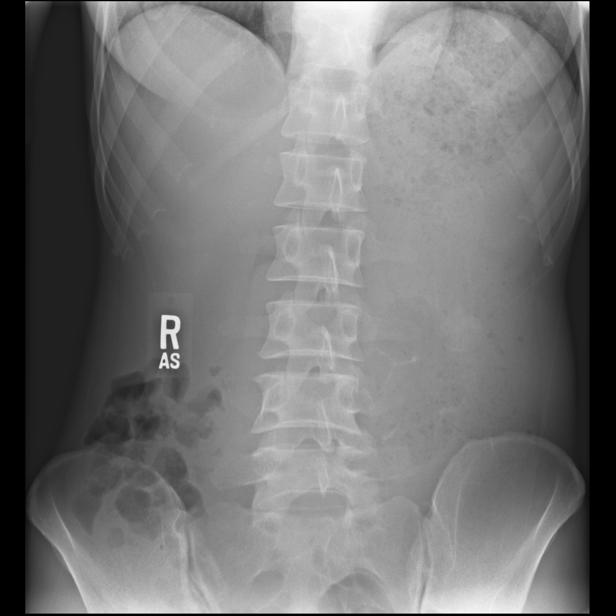
[im 3/20]
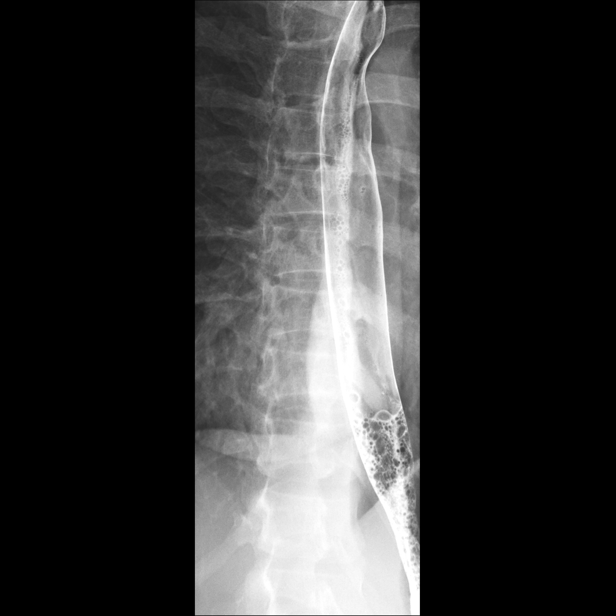
[im 5/20]
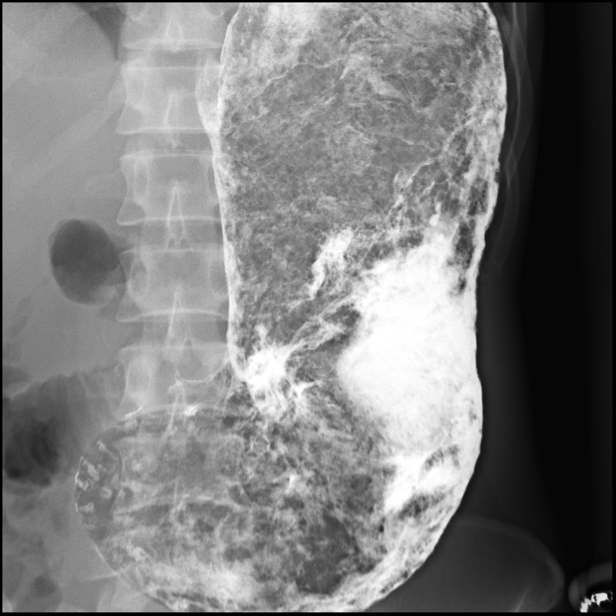
[im 7/20]
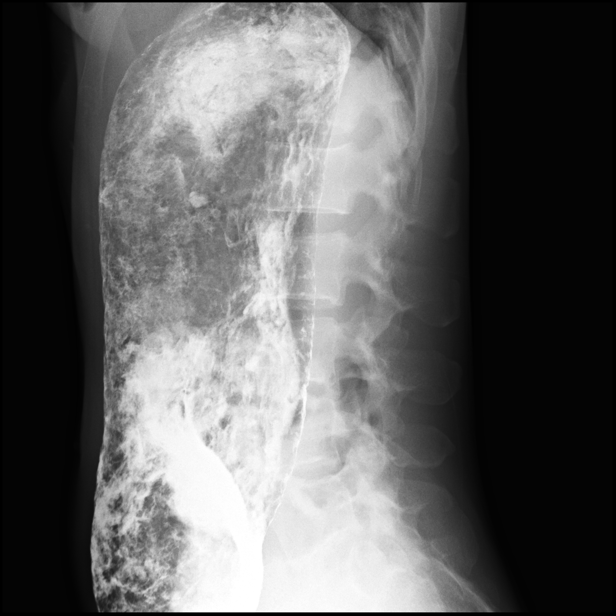
[im 8/20]
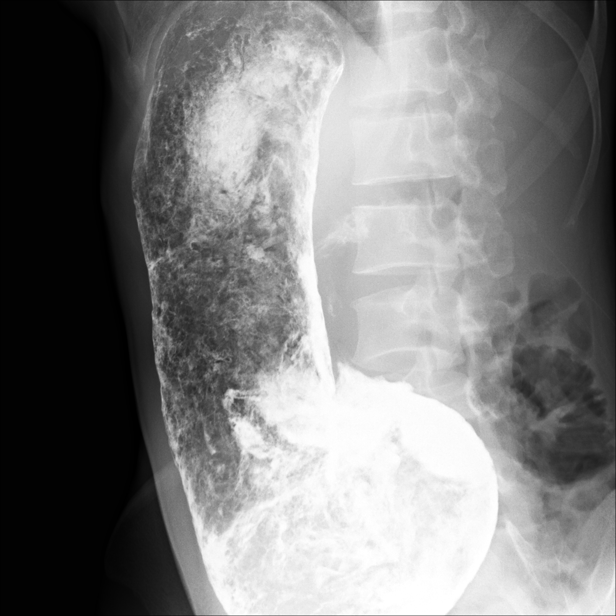
[im 10/20]
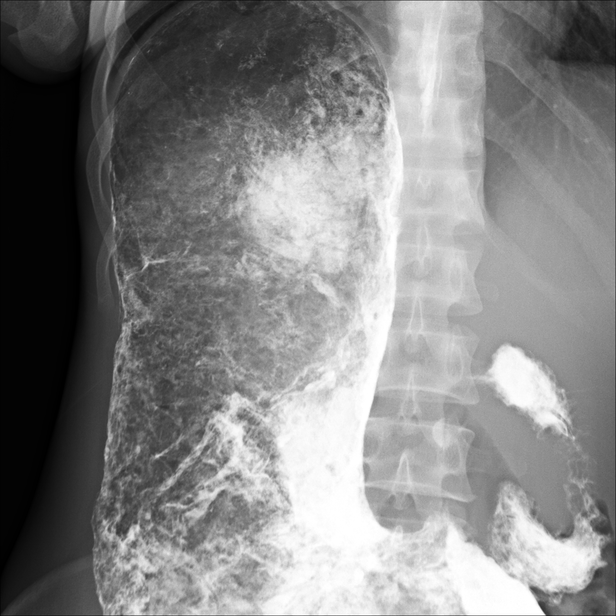
[im 12/20]
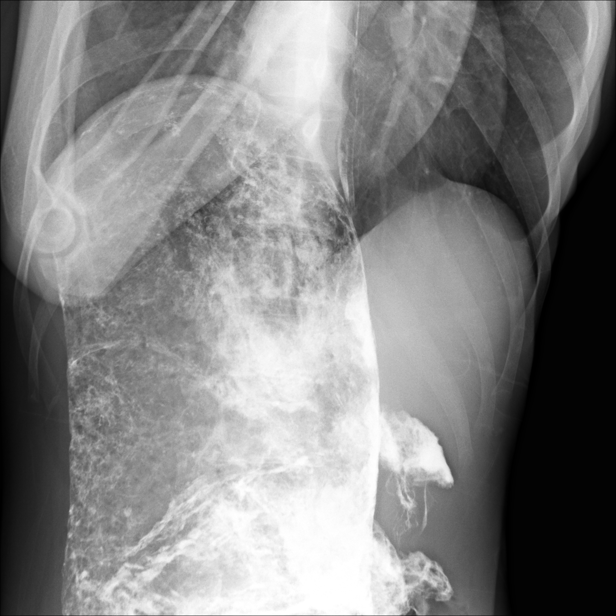
[im 14/20]
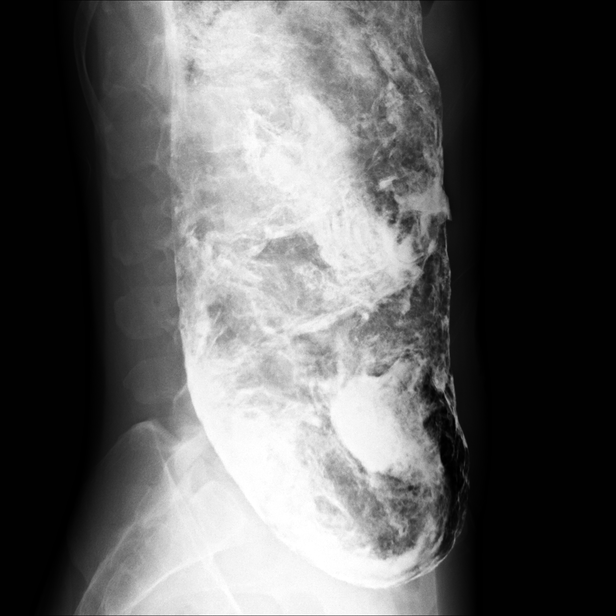
[im 16/20]
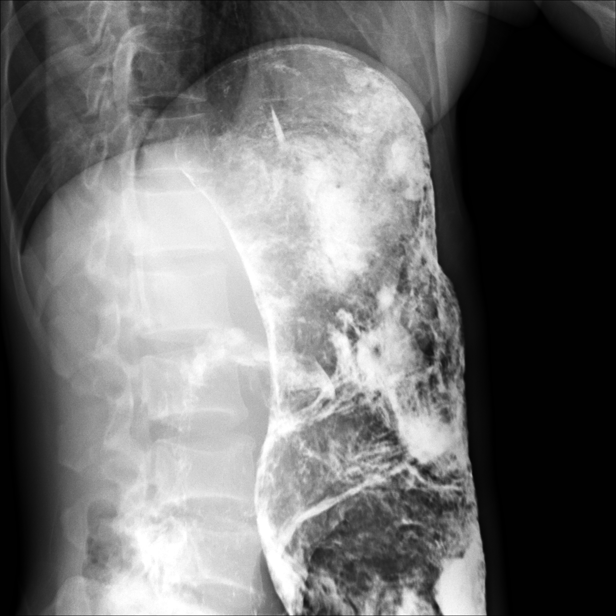
[im 18/20]
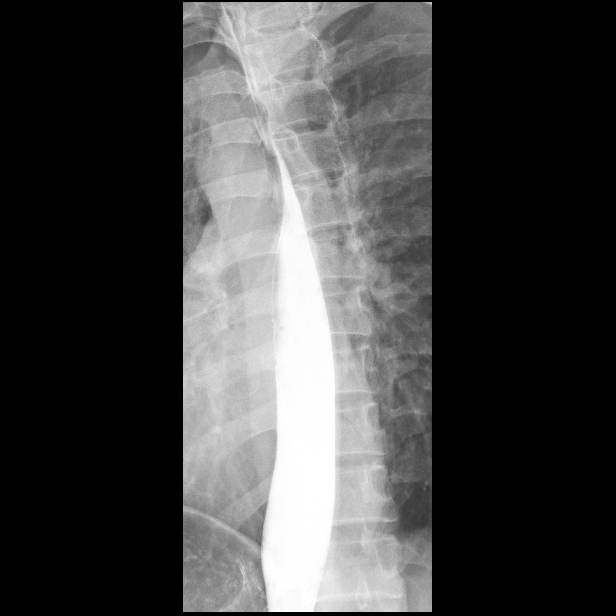
[im 20/20]
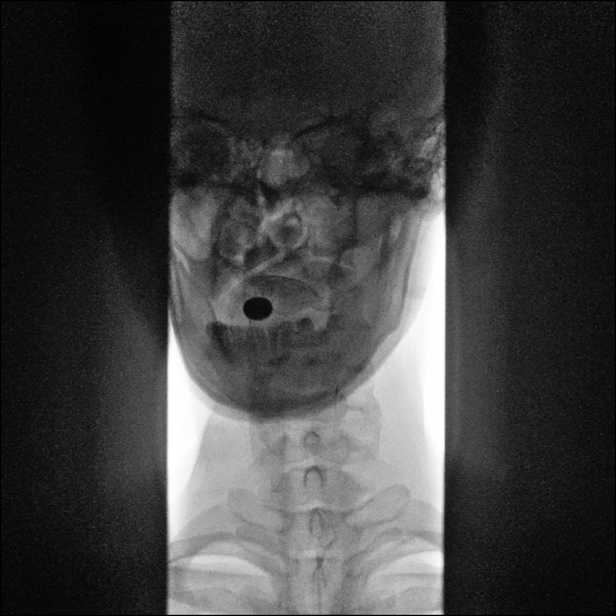

[Series 2: sequence · 2 of 39 frames shown]
[frame 6/39]
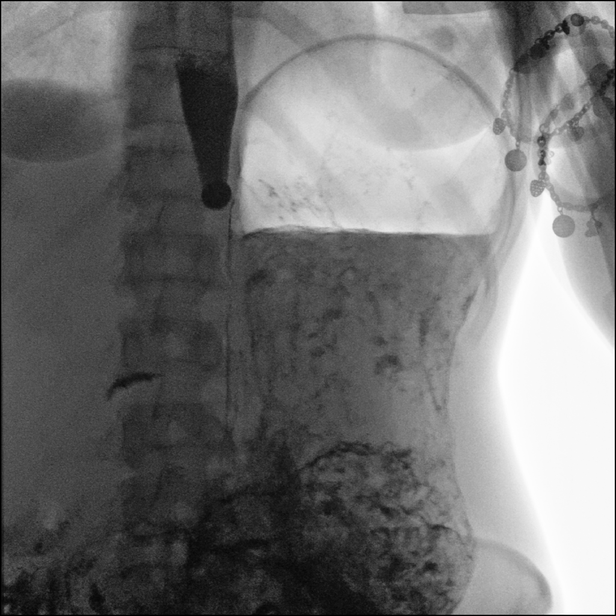
[frame 34/39]
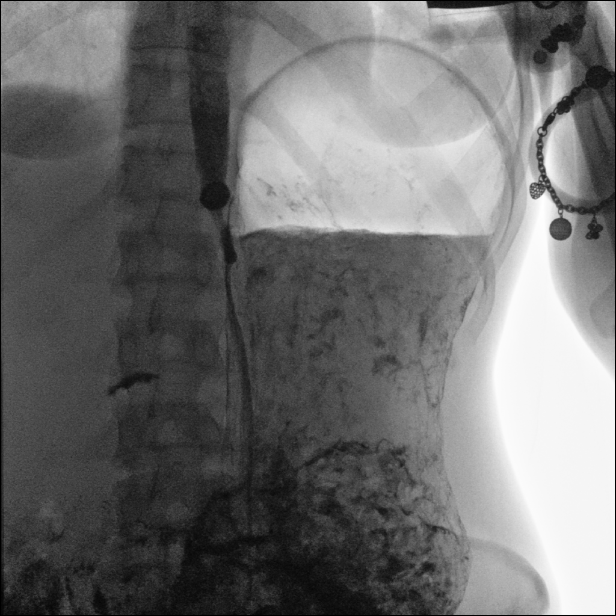

[Series 3: one shot · 1 of 1 slices shown (2 of 2)]
[im 1/1]
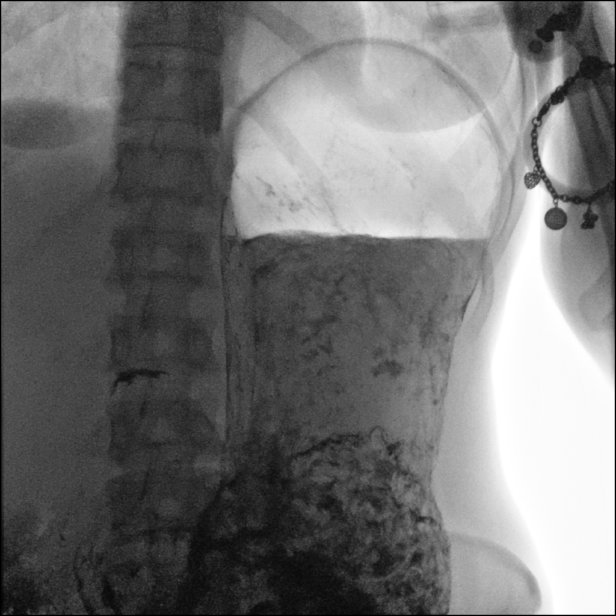

[14 of 24 positions shown; findings below may reference images not displayed]

FINDINGS: Initial KUB demonstrates distended stomach. Bowel gas pattern is
nonobstructive. No pneumoperitoneum.

Double contrast images demonstrated a normal appearance of the
esophageal mucosa. Multiple single swallow attempts were observed,
which demonstrated normal esophageal motility. Full column
esophagram demonstrated no esophageal mass, stricture or esophageal
ring. No hiatal hernia. Water siphon test demonstrated no
gastroesophageal reflux. A barium tablet was administered, which
passed readily into the stomach.

Imaging of the stomach was exceedingly limited by a large amount of
gastric contents. The overall appearance is most compatible with a
bezoar. Accurate assessment of the gastric mucosa and exclusion of
underlying gastric mass is not possible on today's examination.
Duodenal bulb was poorly imaged.
IMPRESSION: 1. Highly unusual appearance of the stomach, which appears filled
with retained food material, presumably a large gastric bezoar. This
suggests a history of gastric outlet obstruction or gastroparesis.
Outpatient referral to Gastroenterology for further evaluation is
strongly recommended.

## 2021-05-08 ENCOUNTER — Encounter: Payer: Self-pay | Admitting: Hematology
